# Patient Record
Sex: Male | Born: 1960 | Race: White | Hispanic: No | Marital: Married | Smoking: Former smoker
Health system: Southern US, Community
[De-identification: ages and names within clinical notes are randomized; demographics above are authoritative.]

## PROBLEM LIST (undated history)

## (undated) DIAGNOSIS — N529 Male erectile dysfunction, unspecified: Secondary | ICD-10-CM

## (undated) DIAGNOSIS — K219 Gastro-esophageal reflux disease without esophagitis: Secondary | ICD-10-CM

## (undated) DIAGNOSIS — M7041 Prepatellar bursitis, right knee: Secondary | ICD-10-CM

## (undated) HISTORY — DX: Prepatellar bursitis, right knee: M70.41

## (undated) HISTORY — DX: Gastro-esophageal reflux disease without esophagitis: K21.9

## (undated) HISTORY — PX: APPENDECTOMY: SHX54

## (undated) HISTORY — DX: Male erectile dysfunction, unspecified: N52.9

---

## 2010-11-10 HISTORY — PX: OTHER SURGICAL HISTORY: SHX169

## 2012-05-10 ENCOUNTER — Ambulatory Visit (INDEPENDENT_AMBULATORY_CARE_PROVIDER_SITE_OTHER): Admitting: Physician Assistant

## 2012-05-10 ENCOUNTER — Ambulatory Visit: Payer: Self-pay | Admitting: Physician Assistant

## 2012-05-10 ENCOUNTER — Encounter: Payer: Self-pay | Admitting: Physician Assistant

## 2012-05-10 VITALS — BP 118/80 | HR 83 | Ht 72.0 in | Wt 225.0 lb

## 2012-05-10 DIAGNOSIS — R5383 Other fatigue: Secondary | ICD-10-CM

## 2012-05-10 DIAGNOSIS — E291 Testicular hypofunction: Secondary | ICD-10-CM

## 2012-05-10 DIAGNOSIS — K219 Gastro-esophageal reflux disease without esophagitis: Secondary | ICD-10-CM

## 2012-05-10 MED ORDER — TADALAFIL 2.5 MG PO TABS: ORAL_TABLET | ORAL | Status: DC

## 2012-05-10 MED ORDER — TESTOSTERONE CYPIONATE 200 MG/ML IM SOLN
300.0000 mg | INTRAMUSCULAR | Status: DC
  Administered 2012-05-10 – 2012-07-05 (×3): 300 mg via INTRAMUSCULAR

## 2012-05-10 NOTE — Patient Instructions (Signed)
Will labs today and call with results.  Start taking Cialis daily. Will recheck in 1-2 months. [facility]  Come in for Testosterone shot in 2 week at next visit will check testosterone.

## 2012-05-11 LAB — VITAMIN B12: Vitamin B-12: 694 pg/mL (ref 211–911)

## 2012-05-11 LAB — TSH: TSH: 1.891 u[IU]/mL (ref 0.350–4.500)

## 2012-05-13 ENCOUNTER — Encounter: Payer: Self-pay | Admitting: Physician Assistant

## 2012-05-13 DIAGNOSIS — K219 Gastro-esophageal reflux disease without esophagitis: Secondary | ICD-10-CM | POA: Insufficient documentation

## 2012-05-13 DIAGNOSIS — E291 Testicular hypofunction: Secondary | ICD-10-CM | POA: Insufficient documentation

## 2012-05-13 MED ORDER — IBUPROFEN-FAMOTIDINE 800-26.6 MG PO TABS: 1.0000 | ORAL_TABLET | Freq: Three times a day (TID) | ORAL | Status: DC

## 2012-05-13 MED ORDER — ESOMEPRAZOLE MAGNESIUM 40 MG PO CPDR: 40.0000 mg | DELAYED_RELEASE_CAPSULE | Freq: Two times a day (BID) | ORAL | Status: DC

## 2012-05-13 NOTE — Progress Notes (Signed)
  Subjective:    Patient ID: John Sharp, male    DOB: Dec 14, 1960, 51 y.o.   MRN: [id number]  HPI Patient presents to the clinic to establish care. PMH reviewed and positive for GERD and Erectile dysfunction. Pt needs refill of nexium and Duexis for acid reflux. He has had a full work up of GERD and findings were that he had slow digestion. Normal colonoscopy in 2011. He has had ED and tried Viagra, Levitra, and as needed Cialis. NOthing has worked it wants to know if there is anything else he can try. He is on testosterone shots and will get one today but is 1 week late for shot. Denies any urinary frequency, urgency, weak stream or prostate issues. He was seen by urology about a year ago and prostate was checked.    Review of Systems     Objective:   Physical Exam  Constitutional: He is oriented to person, place, and time. He appears well-developed and well-nourished.  HENT:  Head: Normocephalic and atraumatic.  Eyes: Conjunctivae are normal.  Neck: Normal range of motion. Neck supple. No thyromegaly present.  Cardiovascular: Normal rate, regular rhythm and normal heart sounds.   Pulmonary/Chest: Effort normal and breath sounds normal.  Neurological: He is alert and oriented to person, place, and time.  Skin: Skin is warm and dry.  Psychiatric: He has a normal mood and affect. His behavior is normal.          Assessment & Plan:  hypogonadisim/Fatigue- Gave shot today. Will recheck testosterone at next shot in 2 weeks. His testosterone might be low and affecting how he fills. We will also check b12, vit D, TSH to make sure there are in normal limits.   Erectile dysfunction- Will give Cialis every day to see if that helps his ED. Follow up in 4-6 weeks.   GERD- Refilled both meds for 6 months.   Needs CPE. Could get fasting labs at that time.

## 2012-05-20 ENCOUNTER — Telehealth: Payer: Self-pay | Admitting: *Deleted

## 2012-05-20 NOTE — Telephone Encounter (Signed)
I put in the prescription but wasn't sure what pharmacy. We will try the Viagra. I did write 100 mg which the higher dose that he can try taking a half and see if this works well.

## 2012-05-20 NOTE — Telephone Encounter (Signed)
Pt came in office asking about filling the Cialis. I called pharmacy and they state that it keeps coming back that he needs to try Viagra first. Please advise if I can send Viagra and what dose?

## 2012-05-21 NOTE — Telephone Encounter (Signed)
See if has coupon card for free tabs of cialis.  At least he can use that while we are waiting. He can have pharm re-run the cialis and that should trigger a prior auth.

## 2012-05-21 NOTE — Telephone Encounter (Signed)
Called pt to inform him and he states he has tried the Viagra and it is in his records in [place]. Told pt need those records first- he will come in on Monday to sign release so we can get those records and we will proceed from there.

## 2012-05-21 NOTE — Telephone Encounter (Signed)
Pt wife notified and will pick up coupon today. KG LPN

## 2012-05-24 ENCOUNTER — Ambulatory Visit (INDEPENDENT_AMBULATORY_CARE_PROVIDER_SITE_OTHER): Admitting: Physician Assistant

## 2012-05-24 DIAGNOSIS — E291 Testicular hypofunction: Secondary | ICD-10-CM

## 2012-05-24 DIAGNOSIS — N529 Male erectile dysfunction, unspecified: Secondary | ICD-10-CM | POA: Insufficient documentation

## 2012-05-24 NOTE — Progress Notes (Signed)
  Subjective:    Patient ID: John Sharp, male    DOB: 31-Dec-1960, 52 y.o.   MRN: [id number] Testosterone injection. Pt brought own med in. Testosterone 200mg /ml Lot# [id number] Exp. 2/16.  Injected 1.31ml into right ventrogluteal without complications. KG LPN HPI    Review of Systems     Objective:   Physical Exam        Assessment & Plan:

## 2012-05-25 LAB — TESTOSTERONE: Testosterone: 330.66 ng/dL (ref 300–890)

## 2012-06-07 ENCOUNTER — Encounter: Payer: Self-pay | Admitting: Physician Assistant

## 2012-06-07 ENCOUNTER — Ambulatory Visit (INDEPENDENT_AMBULATORY_CARE_PROVIDER_SITE_OTHER): Admitting: Physician Assistant

## 2012-06-07 VITALS — BP 138/93 | HR 70

## 2012-06-07 DIAGNOSIS — E291 Testicular hypofunction: Secondary | ICD-10-CM

## 2012-06-07 DIAGNOSIS — G4733 Obstructive sleep apnea (adult) (pediatric): Secondary | ICD-10-CM | POA: Insufficient documentation

## 2012-06-07 NOTE — Progress Notes (Signed)
  Subjective:    Patient ID: John Sharp, male    DOB: 12-21-1960, 51 y.o.   MRN: [id number] Testosterone injection. Pt brought his own injection medication. Lot# [id number] Exp. 12/2014. Injection given on Left hip. HPI     Review of Systems     Objective:   Physical Exam        Assessment & Plan:  Hypogonadism- Injection given today. Next injection in 2 weeks. Will continue to monitor blood pressure. BP not been a problem in the past. Tandy Gaw PA-C.

## 2012-06-21 ENCOUNTER — Ambulatory Visit (INDEPENDENT_AMBULATORY_CARE_PROVIDER_SITE_OTHER): Admitting: Physician Assistant

## 2012-06-21 ENCOUNTER — Encounter: Payer: Self-pay | Admitting: Physician Assistant

## 2012-06-21 VITALS — BP 141/87 | HR 80 | Ht 72.0 in | Wt 220.0 lb

## 2012-06-21 DIAGNOSIS — R0981 Nasal congestion: Secondary | ICD-10-CM

## 2012-06-21 DIAGNOSIS — N529 Male erectile dysfunction, unspecified: Secondary | ICD-10-CM

## 2012-06-21 DIAGNOSIS — M25579 Pain in unspecified ankle and joints of unspecified foot: Secondary | ICD-10-CM

## 2012-06-21 DIAGNOSIS — Z13220 Encounter for screening for lipoid disorders: Secondary | ICD-10-CM

## 2012-06-21 DIAGNOSIS — K219 Gastro-esophageal reflux disease without esophagitis: Secondary | ICD-10-CM

## 2012-06-21 DIAGNOSIS — J3489 Other specified disorders of nose and nasal sinuses: Secondary | ICD-10-CM

## 2012-06-21 DIAGNOSIS — E291 Testicular hypofunction: Secondary | ICD-10-CM

## 2012-06-21 DIAGNOSIS — Z131 Encounter for screening for diabetes mellitus: Secondary | ICD-10-CM

## 2012-06-21 DIAGNOSIS — M25572 Pain in left ankle and joints of left foot: Secondary | ICD-10-CM

## 2012-06-21 MED ORDER — TRAMADOL HCL 50 MG PO TABS: 50.0000 mg | ORAL_TABLET | Freq: Three times a day (TID) | ORAL | Status: DC | PRN

## 2012-06-21 MED ORDER — FLUTICASONE PROPIONATE 50 MCG/ACT NA SUSP: 2.0000 | Freq: Every day | NASAL | Status: DC

## 2012-06-21 MED ORDER — TRAMADOL HCL 50 MG PO TABS: 50.0000 mg | ORAL_TABLET | Freq: Three times a day (TID) | ORAL | Status: AC | PRN

## 2012-06-21 NOTE — Progress Notes (Signed)
Subjective:    Patient ID: John Sharp, male    DOB: 1961/09/24, 51 y.o.   MRN: [id number]  HPI Pt presents to clinic with nasal congestion and starting to feel under the weather for last 2-3 days. She has recently started using CPAP and not being able to wear it all night long because making is nose so dried up. He denies any fever, chills, SOB, muscle aches, ST, Ear pain, Sinus pressure or headaches. He has not tried anything to make nasal congestion better.   He has also had worsening GERD. He drinks multiple cups of coffee a day, drinks alcohol regularly and is on a daily NSAId for bilateral ankle pain from injury in 2012 where he broke both of his ankles. He is having to take Nexium more than once a day. He also reports that he tries to manage is diet better but still eats pretty much what he wants too.   Left ankle pain has started to get more frequent. He take Duexis daily for pain and manages pretty well but lately he will have episodes that he hurts a lot more. This is an ongoing problem since he broke both ankles in 2012. Left ankle hurts more than right. He has not tried anything other than Duexis to make better. He is on his feet a lot.   Concerned about ED. Cialis daily did help but insurance will not approve. Needs prior auth. He denies any depression or anxiety. He does drink alcohol on a regular basis. He does not exercise. He is getting testosterone shots every 2 weeks and has not been in therapuetic range yet. He denies any urinary problems or symptoms with weak stream or urgency. He denies any Prostate problems or family hx of prostate problems. He has had long hx with ED and tried and failed Viagra, Levitra, and cialis as needed.      Review of Systems     Objective:   Physical Exam  Constitutional: He is oriented to person, place, and time. He appears well-developed and well-nourished.  HENT:  Head: Normocephalic and atraumatic.  Right Ear: External ear normal.  Left Ear:  External ear normal.  Mouth/Throat: Oropharynx is clear and moist. No oropharyngeal exudate.       TM's normal bilaterally. Negative for maxillary sinus pressure. Bilateral turbinates red and swollen.   Neck: Normal range of motion. Neck supple.  Cardiovascular: Normal rate, regular rhythm, normal heart sounds and intact distal pulses.   Pulmonary/Chest: Effort normal and breath sounds normal.  Musculoskeletal:       Left ankle: Normal ROM without pain. Tenderness over left lateral malleolus( can actually palpate hardware). Strength 5/5. No brusing, swelling.  Lymphadenopathy:    He has no cervical adenopathy.  Neurological: He is alert and oriented to person, place, and time.  Skin: Skin is warm and dry.  Psychiatric: He has a normal mood and affect. His behavior is normal.          Assessment & Plan:  Nasal congestion- Will rx Flonase to use 2 sprays each nostril once a day. Also told him to get nasal saline to use before he puts his nasal cannula for CPAP in at night to help nose not be so dry. Talked about even changing nasal cannula to mouth mask. It is important that he use CPAP all night to get benefit. Encouraged patient to take Mucinex if starting to have sinus pressure also encourage to suck on Zinc tabs and increase Vitamin C.  Hypogonadism- Was given injection today. Will recheck in 2 weeks blood testosterone before next injection. Goal 500-700 or when patient gets symptomatic benefit.   Erectile Dysfuntion- will work on prior authorization for Cialis daily. Discussed with patient next step is urology. He denies any prostate issues or problems with urination. PSA was done recently and normal. Perhaps his daily ibuprofen might be affect ED but I think benefit outweights side effect and was having ED problems before injury. The only other thing that could help is weight loss and decreasing alcohol consumption. Patient was encouraged to exercise regularly. Since he has a lot of  ankle pain was encouraged to exerise in water. I will order Lipid panel and CMP to screen for cholesterol and diabetes. Needs CPE. Pt told to fast before labs.   GERD- discussed with patient diet. He is drinking a lot of coffee and alcohol both of which are not good for GERD. He also is taking anti-inflammatories every day. Before switching to another medication I think he should cut things that make GERD worse in half and see if any benefit occurs. In January EGD was normal. If not improving can consider switching Nexium to Dexilant.   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 Told not to take extra tylenol OtC when taking Tramadol. Encourage patient to do regular ROM exercises at home. Encourage to wear ace bandage or support when going to be on feet all day. Continue on Duexis daily. Ice and elevate when needed. If starting to become everyday pain could consider consult with Dr. Karie Schwalbe.

## 2012-06-21 NOTE — Patient Instructions (Addendum)
  Sent Tramadol for break through pain. Need to try to control GERD with diet.  Get lipid panel at [facility] when fasting.  2 week get testosterone rechecked. Use nasal saline before use of CPAP. Diet for GERD or PUD Nutrition therapy can help ease the discomfort of gastroesophageal reflux disease (GERD) and peptic ulcer disease (PUD).  HOME CARE INSTRUCTIONS   Eat your meals slowly, in a relaxed setting.   Eat 5 to 6 small meals per day.   If a food causes distress, stop eating it for a period of time.  FOODS TO AVOID  Coffee, regular or decaffeinated.   Cola beverages, regular or low calorie.   Tea, regular or decaffeinated.   Pepper.   Cocoa.   High fat foods, including meats.   Butter, margarine, hydrogenated oil (trans fats).   Peppermint or spearmint (if you have GERD).   Fruits and vegetables if not tolerated.   Alcohol.   Nicotine (smoking or chewing). This is one of the most potent stimulants to acid production in the gastrointestinal tract.   Any food that seems to aggravate your condition.  If you have questions regarding your diet, ask your caregiver or a registered dietitian. TIPS  Lying flat may make symptoms worse. Keep the head of your bed raised 6 to 9 inches (15 to 23 cm) by using a foam wedge or blocks under the legs of the bed.   Do not lay down until 3 hours after eating a meal.   Daily physical activity may help reduce symptoms.  MAKE SURE YOU:   Understand these instructions.   Will watch your condition.   Will get help right away if you are not doing well or get worse.  Document Released: 10/27/2005 Document Revised: 10/16/2011 Document Reviewed: 09/12/2011 [facility] Patient Information 2012 [place], [place].

## 2012-06-24 ENCOUNTER — Other Ambulatory Visit: Payer: Self-pay | Admitting: *Deleted

## 2012-06-24 LAB — COMPLETE METABOLIC PANEL WITH GFR
ALT: 28 U/L (ref 0–53)
AST: 29 U/L (ref 0–37)
Albumin: 4 g/dL (ref 3.5–5.2)
BUN: 18 mg/dL (ref 6–23)
CO2: 26 mEq/L (ref 19–32)
Calcium: 9 mg/dL (ref 8.4–10.5)
Chloride: 107 mEq/L (ref 96–112)
Creat: 0.95 mg/dL (ref 0.50–1.35)
GFR, Est African American: >89 mL/min
Potassium: 4.8 mEq/L (ref 3.5–5.3)

## 2012-06-24 LAB — LIPID PANEL
Cholesterol: 164 mg/dL (ref 0–200)
Triglycerides: 57 mg/dL (ref <150)
VLDL: 11 mg/dL (ref 0–40)

## 2012-06-24 MED ORDER — TADALAFIL 2.5 MG PO TABS: ORAL_TABLET | ORAL | Status: DC

## 2012-07-05 ENCOUNTER — Ambulatory Visit (INDEPENDENT_AMBULATORY_CARE_PROVIDER_SITE_OTHER): Admitting: Physician Assistant

## 2012-07-05 DIAGNOSIS — E291 Testicular hypofunction: Secondary | ICD-10-CM

## 2012-07-05 NOTE — Progress Notes (Signed)
Patient ID: John Sharp, male   DOB: 06/24/1961, 51 y.o.   MRN: [id number] Testosterone inj given 

## 2012-07-13 ENCOUNTER — Other Ambulatory Visit: Payer: Self-pay | Admitting: *Deleted

## 2012-07-13 NOTE — Telephone Encounter (Signed)
Wife has called stating that pt needs a refill on testosterone. States that he needs to get it through [facility] b/c it is cheaper. Please advise if this can be sent.

## 2012-07-13 NOTE — Telephone Encounter (Signed)
If we give injections here, then we can use our supply. He doesn't have to order it.

## 2012-07-13 NOTE — Telephone Encounter (Signed)
Pts wife informed.

## 2012-07-15 ENCOUNTER — Other Ambulatory Visit: Payer: Self-pay | Admitting: *Deleted

## 2012-07-15 MED ORDER — TESTOSTERONE CYPIONATE 200 MG/ML IM SOLN: 300.0000 mg | INTRAMUSCULAR | Status: DC

## 2012-07-19 ENCOUNTER — Other Ambulatory Visit: Payer: Self-pay | Admitting: *Deleted

## 2012-07-19 ENCOUNTER — Ambulatory Visit (INDEPENDENT_AMBULATORY_CARE_PROVIDER_SITE_OTHER): Admitting: Physician Assistant

## 2012-07-19 VITALS — BP 138/90 | HR 80

## 2012-07-19 DIAGNOSIS — E291 Testicular hypofunction: Secondary | ICD-10-CM

## 2012-07-19 MED ORDER — TESTOSTERONE CYPIONATE 200 MG/ML IM SOLN
200.0000 mg | Freq: Once | INTRAMUSCULAR | Status: AC
  Administered 2012-07-19: 300 mg via INTRAMUSCULAR

## 2012-07-19 MED ORDER — TADALAFIL 2.5 MG PO TABS: ORAL_TABLET | ORAL | Status: DC

## 2012-07-19 NOTE — Progress Notes (Signed)
  Subjective:    Patient ID: John Sharp, male    DOB: 24-Jul-1961, 51 y.o.   MRN: [id number]  HPI  Testosterone injection given.  Review of Systems     Objective:   Physical Exam        Assessment & Plan:  Injection given with no complications. 2 week f/u for another shot. Tandy Gaw PA-C

## 2012-08-02 ENCOUNTER — Ambulatory Visit: Admitting: Family Medicine

## 2012-08-02 ENCOUNTER — Telehealth: Payer: Self-pay | Admitting: *Deleted

## 2012-08-02 ENCOUNTER — Ambulatory Visit (INDEPENDENT_AMBULATORY_CARE_PROVIDER_SITE_OTHER): Admitting: Physician Assistant

## 2012-08-02 VITALS — Wt 225.0 lb

## 2012-08-02 DIAGNOSIS — R7989 Other specified abnormal findings of blood chemistry: Secondary | ICD-10-CM

## 2012-08-02 DIAGNOSIS — E291 Testicular hypofunction: Secondary | ICD-10-CM

## 2012-08-02 MED ORDER — TESTOSTERONE CYPIONATE 200 MG/ML IM SOLN: 300.0000 mg | Freq: Once | INTRAMUSCULAR | Status: DC

## 2012-08-02 NOTE — Progress Notes (Signed)
  Subjective:    Patient ID: John Sharp, male    DOB: December 11, 1960, 51 y.o.   MRN: [id number]  HPI Testosterone injection in rt glut   Review of Systems     Objective:   Physical Exam        Assessment & Plan:  INjection given. Next injection in 2 weeks. Tandy Gaw PA-c

## 2012-08-09 ENCOUNTER — Ambulatory Visit: Admitting: Physician Assistant

## 2012-08-10 LAB — TESTOSTERONE, FREE, TOTAL, SHBG
Sex Hormone Binding: 16 nmol/L (ref 13–71)
Testosterone, Free: 279 pg/mL — ABNORMAL HIGH (ref 47.0–244.0)
Testosterone-% Free: 3.2 % — ABNORMAL HIGH (ref 1.6–2.9)

## 2012-08-13 ENCOUNTER — Other Ambulatory Visit: Payer: Self-pay | Admitting: Physician Assistant

## 2012-08-13 MED ORDER — TESTOSTERONE CYPIONATE 200 MG/ML IM SOLN: INTRAMUSCULAR | Status: DC

## 2012-08-16 ENCOUNTER — Ambulatory Visit: Admitting: Physician Assistant

## 2012-08-19 ENCOUNTER — Ambulatory Visit (INDEPENDENT_AMBULATORY_CARE_PROVIDER_SITE_OTHER): Admitting: Sports Medicine

## 2012-08-19 VITALS — BP 142/87 | HR 75

## 2012-08-19 DIAGNOSIS — E291 Testicular hypofunction: Secondary | ICD-10-CM

## 2012-08-19 MED ORDER — TESTOSTERONE CYPIONATE 200 MG/ML IM SOLN
250.0000 mg | Freq: Once | INTRAMUSCULAR | Status: AC
  Administered 2012-08-19: 250 mg via INTRAMUSCULAR

## 2012-08-19 NOTE — Progress Notes (Signed)
  Subjective:    Patient ID: John Sharp, male    DOB: 05-04-1961, 51 y.o.   MRN: [id number] Testosterone injection  HPI    Review of Systems     Objective:   Physical Exam        Assessment & Plan:   John Sharp, M.D. was present for all essential parts of this procedure.

## 2012-09-02 ENCOUNTER — Ambulatory Visit (INDEPENDENT_AMBULATORY_CARE_PROVIDER_SITE_OTHER): Admitting: Sports Medicine

## 2012-09-02 VITALS — BP 156/95 | HR 95

## 2012-09-02 DIAGNOSIS — E291 Testicular hypofunction: Secondary | ICD-10-CM

## 2012-09-02 MED ORDER — TESTOSTERONE CYPIONATE 200 MG/ML IM SOLN
250.0000 mg | Freq: Once | INTRAMUSCULAR | Status: AC
  Administered 2012-09-02: 250 mg via INTRAMUSCULAR

## 2012-09-02 NOTE — Progress Notes (Signed)
I was present, and supervised all essential parts of this procedure. Ihor Austin. Benjamin Stain, M.D.

## 2012-09-16 ENCOUNTER — Ambulatory Visit (INDEPENDENT_AMBULATORY_CARE_PROVIDER_SITE_OTHER): Admitting: Family Medicine

## 2012-09-16 VITALS — BP 152/90 | HR 60

## 2012-09-16 DIAGNOSIS — E291 Testicular hypofunction: Secondary | ICD-10-CM

## 2012-09-16 MED ORDER — TESTOSTERONE CYPIONATE 200 MG/ML IM SOLN
250.0000 mg | INTRAMUSCULAR | Status: DC
  Administered 2012-09-16: 250 mg via INTRAMUSCULAR

## 2012-09-16 NOTE — Progress Notes (Signed)
  Subjective:    Patient ID: John Sharp, male    DOB: 1961-07-29, 51 y.o.   MRN: [id number]  HPI   Here for testosterone injection Review of Systems     Objective:   Physical Exam        Assessment & Plan:

## 2012-09-29 ENCOUNTER — Other Ambulatory Visit: Payer: Self-pay | Admitting: Physician Assistant

## 2012-09-30 ENCOUNTER — Ambulatory Visit (INDEPENDENT_AMBULATORY_CARE_PROVIDER_SITE_OTHER): Admitting: Family Medicine

## 2012-09-30 ENCOUNTER — Telehealth: Payer: Self-pay | Admitting: Physician Assistant

## 2012-09-30 DIAGNOSIS — E291 Testicular hypofunction: Secondary | ICD-10-CM

## 2012-09-30 MED ORDER — TESTOSTERONE CYPIONATE 200 MG/ML IM SOLN
250.0000 mg | Freq: Once | INTRAMUSCULAR | Status: AC
  Administered 2012-09-30: 250 mg via INTRAMUSCULAR

## 2012-09-30 NOTE — Telephone Encounter (Signed)
Patient request to know if he can or needs to have lab work done on his next visit-vew

## 2012-09-30 NOTE — Progress Notes (Signed)
  Subjective:    Patient ID: John Sharp, male    DOB: [birth year], 51 y.o.   MRN: [id number] Testosterone injection HPI    Review of Systems     Objective:   Physical Exam        Assessment & Plan:

## 2012-10-04 NOTE — Telephone Encounter (Signed)
Wife aware

## 2012-10-04 NOTE — Telephone Encounter (Signed)
Will do blood work before next injection. Please call pt and let him know.

## 2012-10-06 ENCOUNTER — Encounter: Payer: Self-pay | Admitting: Physician Assistant

## 2012-10-06 ENCOUNTER — Ambulatory Visit (INDEPENDENT_AMBULATORY_CARE_PROVIDER_SITE_OTHER): Admitting: Physician Assistant

## 2012-10-06 VITALS — BP 136/81 | HR 98 | Temp 98.1°F | Ht 72.0 in | Wt 234.0 lb

## 2012-10-06 DIAGNOSIS — J40 Bronchitis, not specified as acute or chronic: Secondary | ICD-10-CM

## 2012-10-06 DIAGNOSIS — J069 Acute upper respiratory infection, unspecified: Secondary | ICD-10-CM

## 2012-10-06 MED ORDER — BENZONATATE 100 MG PO CAPS: 100.0000 mg | ORAL_CAPSULE | Freq: Three times a day (TID) | ORAL | Status: DC | PRN

## 2012-10-06 MED ORDER — AMOXICILLIN-POT CLAVULANATE 875-125 MG PO TABS: 1.0000 | ORAL_TABLET | Freq: Two times a day (BID) | ORAL | Status: DC

## 2012-10-06 MED ORDER — HYDROCODONE-HOMATROPINE 5-1.5 MG/5ML PO SYRP: 5.0000 mL | ORAL_SOLUTION | Freq: Four times a day (QID) | ORAL | Status: DC | PRN

## 2012-10-06 NOTE — Patient Instructions (Addendum)
[facility] Cold Care liquid drops 2 drops Three times a day. Mucinex D twice a day.   If not improving will get Augmentin.   If spike fever go to Urgent Care to get chest x-ray.   Bronchitis Bronchitis is the body's way of reacting to injury and/or infection (inflammation) of the bronchi. Bronchi are the air tubes that extend from the windpipe into the lungs. If the inflammation becomes severe, it may cause shortness of breath. CAUSES  Inflammation may be caused by:  A virus.  Germs (bacteria).  Dust.  Allergens.  Pollutants and many other irritants. The cells lining the bronchial tree are covered with tiny hairs (cilia). These constantly beat upward, away from the lungs, toward the mouth. This keeps the lungs free of pollutants. When these cells become too irritated and are unable to do their job, mucus begins to develop. This causes the characteristic cough of bronchitis. The cough clears the lungs when the cilia are unable to do their job. Without either of these protective mechanisms, the mucus would settle in the lungs. Then you would develop pneumonia. Smoking is a common cause of bronchitis and can contribute to pneumonia. Stopping this habit is the single most important thing you can do to help yourself. TREATMENT   Your caregiver may prescribe an antibiotic if the cough is caused by bacteria. Also, medicines that open up your airways make it easier to breathe. Your caregiver may also recommend or prescribe an expectorant. It will loosen the mucus to be coughed up. Only take over-the-counter or prescription medicines for pain, discomfort, or fever as directed by your caregiver.  Removing whatever causes the problem (smoking, for example) is critical to preventing the problem from getting worse.  Cough suppressants may be prescribed for relief of cough symptoms.  Inhaled medicines may be prescribed to help with symptoms now and to help prevent problems from returning.  For those  with recurrent (chronic) bronchitis, there may be a need for steroid medicines. SEEK IMMEDIATE MEDICAL CARE IF:   During treatment, you develop more pus-like mucus (purulent sputum).  You have a fever.  Your baby is older than 3 months with a rectal temperature of 102 F (38.9 C) or higher.  Your baby is 62 months old or younger with a rectal temperature of 100.4 F (38 C) or higher.  You become progressively more ill.  You have increased difficulty breathing, wheezing, or shortness of breath. It is necessary to seek immediate medical care if you are elderly or sick from any other disease. MAKE SURE YOU:   Understand these instructions.  Will watch your condition.  Will get help right away if you are not doing well or get worse. Document Released: 10/27/2005 Document Revised: 01/19/2012 Document Reviewed: 09/05/2008 [facility] Patient Information 2013 [place], [place].

## 2012-10-06 NOTE — Progress Notes (Signed)
  Subjective:    Patient ID: John Sharp, male    DOB: 10/28/1961, 51 y.o.   MRN: [id number]  HPI Patient is a 51 yo male who presents to the clinic with persistent productive cough for last 7 days. The production is green and yellow. Denies any blood. He denies any Fever, ST, ear pain, SOB, wheezing. He has not had any sick contacts that he knows of. He has felt cold and then hot off and on. He does have some facial tenderness. He has tried Robtussin, regular mucinex, and tylenol sinus and cold. They have helped some. He is just so tired of coughing and keeping him up at night.    Review of Systems     Objective:   Physical Exam  Constitutional: He is oriented to person, place, and time. He appears well-developed and well-nourished.  HENT:  Head: Normocephalic and atraumatic.  Right Ear: External ear normal.  Left Ear: External ear normal.  Nose: Nose normal.  Mouth/Throat: No oropharyngeal exudate.       TM's clear. No blood or pus.   Mild maxillary tenderness to palpation.   Oropharynx erythematous with PND. Tonsils normal size.   Eyes: Conjunctivae normal are normal.  Neck: Normal range of motion. Neck supple.       Mild anterior cervical tenderness with enlargment.  Cardiovascular: Normal rate, regular rhythm and normal heart sounds.   Pulmonary/Chest: Effort normal and breath sounds normal. He has no wheezes.       Coarse breath sounds bilaterally.  Lymphadenopathy:    He has cervical adenopathy.  Neurological: He is alert and oriented to person, place, and time.  Skin: Skin is warm and dry.  Psychiatric: He has a normal mood and affect. His behavior is normal.          Assessment & Plan:  Bronchitis, Acute- Reassured patient that I suspect viral. I gave him cough syrup to use at night only to help him sleep. Tessalon pearles were given to take up to TID. Told him about [facility] cold care that could also help with cough. Discussed other symptomatic care. Since the holiday  did give rx for augmentin to get filled if worsened or continued into the weekend. If spikes fever go to UC and get CXR. Call office if any problems or concerns.

## 2012-10-14 ENCOUNTER — Ambulatory Visit (INDEPENDENT_AMBULATORY_CARE_PROVIDER_SITE_OTHER): Admitting: Physician Assistant

## 2012-10-14 VITALS — BP 140/88 | HR 90

## 2012-10-14 DIAGNOSIS — E291 Testicular hypofunction: Secondary | ICD-10-CM

## 2012-10-14 LAB — HEPATIC FUNCTION PANEL
ALT: 31 U/L (ref 0–53)
AST: 35 U/L (ref 0–37)
Bilirubin, Direct: 0.1 mg/dL (ref 0.0–0.3)
Indirect Bilirubin: 0.3 mg/dL (ref 0.0–0.9)
Total Protein: 6.6 g/dL (ref 6.0–8.3)

## 2012-10-14 MED ORDER — TESTOSTERONE CYPIONATE 200 MG/ML IM SOLN
250.0000 mg | INTRAMUSCULAR | Status: DC
  Administered 2012-10-14: 250 mg via INTRAMUSCULAR

## 2012-10-14 NOTE — Progress Notes (Signed)
Patient ID: John Sharp, male   DOB: 1961/08/29, 51 y.o.   MRN: [id number] Testosterone inj given

## 2012-10-14 NOTE — Progress Notes (Signed)
  Subjective:    Patient ID: John Sharp, male    DOB: [birth year], 51 y.o.   MRN: [id number]  HPI    Review of Systems     Objective:   Physical Exam        Assessment & Plan:  Testosterone injection given with no complications. Tandy Gaw PA-C Follow up in 2 weeks with next injection. Will call patient with labs.

## 2012-10-15 LAB — TESTOSTERONE, FREE, TOTAL, SHBG
Sex Hormone Binding: 16 nmol/L (ref 13–71)
Testosterone, Free: 62.5 pg/mL (ref 47.0–244.0)
Testosterone: 228.82 ng/dL — ABNORMAL LOW (ref 300–890)

## 2012-10-18 ENCOUNTER — Other Ambulatory Visit: Payer: Self-pay | Admitting: Physician Assistant

## 2012-10-18 MED ORDER — TESTOSTERONE CYPIONATE 200 MG/ML IM SOLN: INTRAMUSCULAR | Status: DC

## 2012-10-21 ENCOUNTER — Ambulatory Visit (INDEPENDENT_AMBULATORY_CARE_PROVIDER_SITE_OTHER): Admitting: Family Medicine

## 2012-10-21 ENCOUNTER — Encounter: Payer: Self-pay | Admitting: Family Medicine

## 2012-10-21 VITALS — BP 134/92 | HR 90 | Temp 97.7°F | Wt 235.0 lb

## 2012-10-21 DIAGNOSIS — H6990 Unspecified Eustachian tube disorder, unspecified ear: Secondary | ICD-10-CM

## 2012-10-21 DIAGNOSIS — H9201 Otalgia, right ear: Secondary | ICD-10-CM

## 2012-10-21 DIAGNOSIS — H6980 Other specified disorders of Eustachian tube, unspecified ear: Secondary | ICD-10-CM

## 2012-10-21 DIAGNOSIS — H9209 Otalgia, unspecified ear: Secondary | ICD-10-CM

## 2012-10-21 MED ORDER — PREDNISONE 20 MG PO TABS: ORAL_TABLET | ORAL | Status: AC

## 2012-10-21 NOTE — Progress Notes (Signed)
CC: John Sharp is a 51 y.o. male is here for Otalgia   Subjective: HPI:  Right ear pressure started one week ago present 24 hours a day, does not wake at night, nothing seems to make it better or worse. Pain is moderate in severity. He's never had this before. Symptoms seem to start after he stopped Augmentin for upper respiratory infection 2 weeks ago. Associated with nasal congestion which is clear and a dry cough. Denies fevers, chills, hearing loss, dizziness, ringing of the ears, ear discharge, sinus pain, headaches, nor shortness of breath. Interventions include Mucinex without much help   Review Of Systems Outlined In HPI  Past Medical History  Diagnosis Date  . GERD (gastroesophageal reflux disease)   . Erectile dysfunction      No family history on file.   History  Substance Use Topics  . Smoking status: Never Smoker   . Smokeless tobacco: Not on file  . Alcohol Use: Not on file     Objective: Filed Vitals:   10/21/12 0917  BP: 134/92  Pulse: 90  Temp: 97.7 F (36.5 C)    General: Alert and Oriented, No Acute Distress HEENT: Pupils equal, round, reactive to light. Conjunctivae clear.  External ears unremarkable, canals clear with intact TMs with appropriate landmarks.  Middle ear appears open without effusion. Pink inferior turbinates.  Moist mucous membranes, pharynx without inflammation nor lesions.  Neck supple without palpable lymphadenopathy nor abnormal masses. Lungs: Clear to auscultation bilaterally, no wheezing/ronchi/rales.  Comfortable work of breathing. Good air movement. Cardiac: Regular rate and rhythm. Normal S1/S2.  No murmurs, rubs, nor gallops.    Tympanogram performed showing normal external cavity volume however significant negative middle ear pressure.  Assessment & Plan: Demarkus was seen today for otalgia.  Diagnoses and associated orders for this visit:  Eustachian tube dysfunction - predniSONE (DELTASONE) 20 MG tablet; Three tabs at once  daily for five days.  Right ear pain - predniSONE (DELTASONE) 20 MG tablet; Three tabs at once daily for five days.    Clear middle ear and tympanogram would suggest eustachian tube dysfunction, he will restart daily Flonase throughout the winter season and given his degree of discomfort we'll start  prednisone burst.  Return if symptoms worsen or fail to improve.

## 2012-10-26 ENCOUNTER — Telehealth: Payer: Self-pay | Admitting: *Deleted

## 2012-10-26 DIAGNOSIS — H9209 Otalgia, unspecified ear: Secondary | ICD-10-CM

## 2012-10-26 MED ORDER — HYDROCODONE-HOMATROPINE 5-1.5 MG/5ML PO SYRP: 5.0000 mL | ORAL_SOLUTION | Freq: Three times a day (TID) | ORAL | Status: DC | PRN

## 2012-10-26 NOTE — Telephone Encounter (Signed)
Sue Lush, Can you please check to see if he started the 5 days of prednisone and restarted daily use of Flonase.  If so and still having ear issues I'll need to refer him to an ENT specialist.  I don't recall talking about cough medicine but I'd be happy to call in a prescription strength cough medicine if he can confirm that he has no allergies.

## 2012-10-26 NOTE — Telephone Encounter (Signed)
Spoke with pt's wife and he has finished the prednisone and is using the flonase, but the wife states he is still having issues. Pt does have a cough and would like the cough medication. Advised that Dr. Ivan Anchors would refer him to ENT

## 2012-10-26 NOTE — Telephone Encounter (Signed)
rx sent

## 2012-10-26 NOTE — Telephone Encounter (Signed)
ENT referral placed,  Sue Lush, will you please call in the [facility] rx that i've placed in your mailbox.  Thank you.

## 2012-10-26 NOTE — Telephone Encounter (Signed)
Pt calls and states that his ear feels blocked and can not hear and wants to know if there is anything else you can give him. Also has cough and thought you were gonna send in cough med as well but didn't. Using Robitussin OTC no help. Uses [facility] [place]

## 2012-10-28 ENCOUNTER — Ambulatory Visit (INDEPENDENT_AMBULATORY_CARE_PROVIDER_SITE_OTHER): Admitting: Family Medicine

## 2012-10-28 DIAGNOSIS — E291 Testicular hypofunction: Secondary | ICD-10-CM

## 2012-10-28 MED ORDER — TESTOSTERONE CYPIONATE 200 MG/ML IM SOLN
300.0000 mg | Freq: Once | INTRAMUSCULAR | Status: AC
  Administered 2012-10-28: 300 mg via INTRAMUSCULAR

## 2012-10-28 NOTE — Progress Notes (Signed)
  Subjective:    Patient ID: John Sharp, male    DOB: 01/06/1961, 51 y.o.   MRN: [id number]  HPI    Review of Systems     Objective:   Physical Exam        Assessment & Plan:  Test inj given.

## 2012-11-11 ENCOUNTER — Ambulatory Visit

## 2012-11-12 ENCOUNTER — Ambulatory Visit (INDEPENDENT_AMBULATORY_CARE_PROVIDER_SITE_OTHER): Admitting: Physician Assistant

## 2012-11-12 VITALS — BP 122/81 | HR 68

## 2012-11-12 DIAGNOSIS — E291 Testicular hypofunction: Secondary | ICD-10-CM

## 2012-11-12 MED ORDER — TESTOSTERONE CYPIONATE 200 MG/ML IM SOLN
300.0000 mg | Freq: Once | INTRAMUSCULAR | Status: AC
  Administered 2012-11-12: 300 mg via INTRAMUSCULAR

## 2012-11-12 NOTE — Progress Notes (Signed)
  Subjective:    Patient ID: John Sharp, male    DOB: 10-Jan-1961, 52 y.o.   MRN: [id number]  HPI   Here for a testosterone injection Review of Systems     Objective:   Physical Exam        Assessment & Plan:  Injection given without complication. Follow up in 2 weeks for another injection. Tandy Gaw PA-C

## 2012-11-26 ENCOUNTER — Ambulatory Visit (INDEPENDENT_AMBULATORY_CARE_PROVIDER_SITE_OTHER): Admitting: Physician Assistant

## 2012-11-26 VITALS — BP 135/90 | HR 79

## 2012-11-26 DIAGNOSIS — E291 Testicular hypofunction: Secondary | ICD-10-CM

## 2012-11-26 MED ORDER — TESTOSTERONE CYPIONATE 200 MG/ML IM SOLN
300.0000 mg | Freq: Once | INTRAMUSCULAR | Status: AC
  Administered 2012-11-26: 300 mg via INTRAMUSCULAR

## 2012-11-26 NOTE — Progress Notes (Signed)
  Subjective:    Patient ID: John Sharp, male    DOB: 06-12-1961, 52 y.o.   MRN: [id number]  HPI Testosterone injection given.   Review of Systems     Objective:   Physical Exam        Assessment & Plan:  Testosterone injection given without complaint. Followup in 2 weeks for next injection. Tandy Gaw PA-c

## 2012-12-09 ENCOUNTER — Ambulatory Visit (INDEPENDENT_AMBULATORY_CARE_PROVIDER_SITE_OTHER): Admitting: Family Medicine

## 2012-12-09 DIAGNOSIS — E291 Testicular hypofunction: Secondary | ICD-10-CM

## 2012-12-09 MED ORDER — TESTOSTERONE CYPIONATE 200 MG/ML IM SOLN
300.0000 mg | Freq: Once | INTRAMUSCULAR | Status: AC
  Administered 2012-12-09: 300 mg via INTRAMUSCULAR

## 2012-12-09 NOTE — Progress Notes (Signed)
  Subjective:    Patient ID: John Sharp, male    DOB: Mar 20, 1961, 52 y.o.   MRN: [id number] Testosterone injection.  300mg .  Pt brings own testosterone. HPI    Review of Systems     Objective:   Physical Exam        Assessment & Plan:

## 2012-12-10 ENCOUNTER — Ambulatory Visit

## 2012-12-24 ENCOUNTER — Ambulatory Visit (INDEPENDENT_AMBULATORY_CARE_PROVIDER_SITE_OTHER): Admitting: Physician Assistant

## 2012-12-24 ENCOUNTER — Telehealth: Payer: Self-pay | Admitting: Physician Assistant

## 2012-12-24 VITALS — BP 138/94

## 2012-12-24 DIAGNOSIS — E291 Testicular hypofunction: Secondary | ICD-10-CM

## 2012-12-24 MED ORDER — TESTOSTERONE CYPIONATE 200 MG/ML IM SOLN: 300.0000 mg | Freq: Once | INTRAMUSCULAR | Status: DC

## 2012-12-24 NOTE — Progress Notes (Signed)
  Subjective:    Patient ID: John Sharp, male    DOB: [birth year], 52 y.o.   MRN: [id number]  HPI   Here for testosterone injection  Review of Systems     Objective:   Physical Exam        Assessment & Plan:

## 2012-12-24 NOTE — Progress Notes (Signed)
  Subjective:    Patient ID: John Sharp, male    DOB: Feb 21, 1961, 52 y.o.   MRN: [id number]  HPI Pt comes in for testosterone shot.    Review of Systems     Objective:   Physical Exam        Assessment & Plan:  Male hypogonadism- Gave testosterone shot with no complications. Ordered testosterone and CBC to test in 1 week. Next shot in 2 weeks. Derriana Oser PA-C.

## 2012-12-24 NOTE — Telephone Encounter (Signed)
Pt notified.  Will call next Friday for me to release labs.

## 2012-12-24 NOTE — Telephone Encounter (Signed)
Will you please call pt and let him know it has been 6 weeks at this dose lets recheck testosterone 1 week from today at peak level to see if we can get within optimal range for you. Will order labs will have to release them when he comes in a week.

## 2012-12-26 ENCOUNTER — Other Ambulatory Visit: Payer: Self-pay | Admitting: Physician Assistant

## 2012-12-31 ENCOUNTER — Other Ambulatory Visit: Payer: Self-pay | Admitting: *Deleted

## 2012-12-31 DIAGNOSIS — E291 Testicular hypofunction: Secondary | ICD-10-CM

## 2012-12-31 LAB — CBC
HCT: 43.5 % (ref 39.0–52.0)
Hemoglobin: 14.9 g/dL (ref 13.0–17.0)
MCV: 86.1 fL (ref 78.0–100.0)
RBC: 5.05 MIL/uL (ref 4.22–5.81)
WBC: 5.1 10*3/uL (ref 4.0–10.5)

## 2013-01-03 LAB — TESTOSTERONE, FREE, TOTAL, SHBG
Testosterone-% Free: 3.1 % — ABNORMAL HIGH (ref 1.6–2.9)
Testosterone: 848 ng/dL (ref 300–890)

## 2013-01-07 ENCOUNTER — Ambulatory Visit (INDEPENDENT_AMBULATORY_CARE_PROVIDER_SITE_OTHER): Admitting: Family Medicine

## 2013-01-07 DIAGNOSIS — E291 Testicular hypofunction: Secondary | ICD-10-CM

## 2013-01-07 MED ORDER — TESTOSTERONE CYPIONATE 200 MG/ML IM SOLN
200.0000 mg | Freq: Once | INTRAMUSCULAR | Status: AC
  Administered 2013-01-07: 200 mg via INTRAMUSCULAR

## 2013-01-07 NOTE — Progress Notes (Signed)
  Subjective:    Patient ID: John Sharp, male    DOB: 1961-06-19, 52 y.o.   MRN: [id number]  HPI    Review of Systems     Objective:   Physical Exam        Assessment & Plan:  Test injection

## 2013-01-21 ENCOUNTER — Encounter: Payer: Self-pay | Admitting: Physician Assistant

## 2013-01-21 ENCOUNTER — Ambulatory Visit (INDEPENDENT_AMBULATORY_CARE_PROVIDER_SITE_OTHER): Admitting: Physician Assistant

## 2013-01-21 VITALS — BP 143/89 | HR 88 | Wt 238.0 lb

## 2013-01-21 DIAGNOSIS — N529 Male erectile dysfunction, unspecified: Secondary | ICD-10-CM

## 2013-01-21 DIAGNOSIS — N400 Enlarged prostate without lower urinary tract symptoms: Secondary | ICD-10-CM

## 2013-01-21 DIAGNOSIS — E291 Testicular hypofunction: Secondary | ICD-10-CM

## 2013-01-21 DIAGNOSIS — F411 Generalized anxiety disorder: Secondary | ICD-10-CM

## 2013-01-21 DIAGNOSIS — R030 Elevated blood-pressure reading, without diagnosis of hypertension: Secondary | ICD-10-CM

## 2013-01-21 DIAGNOSIS — F3289 Other specified depressive episodes: Secondary | ICD-10-CM

## 2013-01-21 DIAGNOSIS — F329 Major depressive disorder, single episode, unspecified: Secondary | ICD-10-CM

## 2013-01-21 MED ORDER — FLUOXETINE HCL 10 MG PO TABS: 10.0000 mg | ORAL_TABLET | Freq: Every day | ORAL | Status: DC

## 2013-01-21 MED ORDER — TESTOSTERONE CYPIONATE 200 MG/ML IM SOLN
200.0000 mg | Freq: Once | INTRAMUSCULAR | Status: AC
  Administered 2013-01-21: 200 mg via INTRAMUSCULAR

## 2013-01-21 NOTE — Progress Notes (Signed)
  Subjective:    Patient ID: John Sharp, male    DOB: [birth year], 52 y.o.   MRN: [id number]  HPI Patient presents to clinic with wife to talk about mood/anxiety/depression.  Testosterone levels have not been stable. He has changed brands 3 times and we orginally were not checking at the right times. He feels very moody. He is anxious most of time but some days feel like he does not want to get out of bed. He is not concerned with symptoms as much as his wife his. She states "he is really hard to live with". Denies any suicidal thoughts. Never been on any meds.     Would like a referral to Urologist. Was seen by one in the past for ED,BPH, hypogonadism.       Review of Systems     Objective:   Physical Exam  Constitutional: He is oriented to person, place, and time. He appears well-developed and well-nourished.  HENT:  Head: Normocephalic and atraumatic.  Cardiovascular: Normal rate, regular rhythm and normal heart sounds.   Pulmonary/Chest: Effort normal and breath sounds normal.  Neurological: He is alert and oriented to person, place, and time.  Skin: Skin is warm and dry.  Psychiatric: He has a normal mood and affect. His behavior is normal.          Assessment & Plan:  Male hypogonadism- Shot given. Will recheck levels in 1 week to see if stable. Will refer to urology Dr. Katrinka Blazing.   Anxiety/Depression/mood issues- Will start Prozac 10mg  daily follow up in 4 to 6 weeks for recheck. Call if worsening depression or anxiety occur. Aware needs time to get in system and feels better. Discussed how exercise could help feel better.   Elevated blood pressure- discussed low salt diet. Pt does not have history of elevated BP. Will not treat at this time and will continue to monitor.

## 2013-01-21 NOTE — Patient Instructions (Addendum)
Start prozac daily. Let me know if improving.   1.5 Gram Low Sodium Diet A 1.5 gram sodium diet restricts the amount of sodium in the diet to no more than 1.5 g or 1500 mg daily. The American Heart Association recommends Americans over the age of 47 to consume no more than 1500 mg of sodium each day to reduce the risk of developing high blood pressure. Research also shows that limiting sodium may reduce heart attack and stroke risk. Many foods contain sodium for flavor and sometimes as a preservative. When the amount of sodium in a diet needs to be low, it is important to know what to look for when choosing foods and drinks. The following includes some information and guidelines to help make it easier for you to adapt to a low sodium diet. QUICK TIPS  Do not add salt to food.  Avoid convenience items and fast food.  Choose unsalted snack foods.  Buy lower sodium products, often labeled as "lower sodium" or "no salt added."  Check food labels to learn how much sodium is in 1 serving.  When eating at a restaurant, ask that your food be prepared with less salt or none, if possible. READING FOOD LABELS FOR SODIUM INFORMATION The nutrition facts label is a good place to find how much sodium is in foods. Look for products with no more than 400 mg of sodium per serving. Remember that 1.5 g = 1500 mg. The food label may also list foods as:  Sodium-free: Less than 5 mg in a serving.  Very low sodium: 35 mg or less in a serving.  Low-sodium: 140 mg or less in a serving.  Light in sodium: 50% less sodium in a serving. For example, if a food that usually has 300 mg of sodium is changed to become light in sodium, it will have 150 mg of sodium.  Reduced sodium: 25% less sodium in a serving. For example, if a food that usually has 400 mg of sodium is changed to reduced sodium, it will have 300 mg of sodium. CHOOSING FOODS Grains  Avoid: Salted crackers and snack items. Some cereals, including  instant hot cereals. Bread stuffing and biscuit mixes. Seasoned rice or pasta mixes.  Choose: Unsalted snack items. Low-sodium cereals, oats, puffed wheat and rice, shredded wheat. English muffins and bread. Pasta. Meats  Avoid: Salted, canned, smoked, spiced, pickled meats, including fish and poultry. Bacon, ham, sausage, cold cuts, hot dogs, anchovies.  Choose: Low-sodium canned tuna and salmon. Fresh or frozen meat, poultry, and fish. Dairy  Avoid: Processed cheese and spreads. Cottage cheese. Buttermilk and condensed milk. Regular cheese.  Choose: Milk. Low-sodium cottage cheese. Yogurt. Sour cream. Low-sodium cheese. Fruits and Vegetables  Avoid: Regular canned vegetables. Regular canned tomato sauce and paste. Frozen vegetables in sauces. Olives. Rosita Fire. Relishes. Sauerkraut.  Choose: Low-sodium canned vegetables. Low-sodium tomato sauce and paste. Frozen or fresh vegetables. Fresh and frozen fruit. Condiments  Avoid: Canned and packaged gravies. Worcestershire sauce. Tartar sauce. Barbecue sauce. Soy sauce. Steak sauce. Ketchup. Onion, garlic, and table salt. Meat flavorings and tenderizers.  Choose: Fresh and dried herbs and spices. Low-sodium varieties of mustard and ketchup. Lemon juice. Tabasco sauce. Horseradish. SAMPLE 1.5 GRAM SODIUM MEAL PLAN Breakfast / Sodium (mg)  1 cup low-fat milk / 143 mg  1 whole-wheat English muffin / 240 mg  1 tbs heart-healthy margarine / 153 mg  1 hard-boiled egg / 139 mg  1 small orange / 0 mg Lunch / Sodium (mg)  1 cup raw carrots / 76 mg  2 tbs no salt added peanut butter / 5 mg  2 slices whole-wheat bread / 270 mg  1 tbs jelly / 6 mg   cup red grapes / 2 mg Dinner / Sodium (mg)  1 cup whole-wheat pasta / 2 mg  1 cup low-sodium tomato sauce / 73 mg  3 oz lean ground beef / 57 mg  1 small side salad (1 cup raw spinach leaves,  cup cucumber,  cup yellow bell pepper) with 1 tsp olive oil and 1 tsp red wine vinegar /  25 mg Snack / Sodium (mg)  1 container low-fat vanilla yogurt / 107 mg  3 graham cracker squares / 127 mg Nutrient Analysis  Calories: 1745  Protein: 75 g  Carbohydrate: 237 g  Fat: 57 g  Sodium: 1425 mg Document Released: 10/27/2005 Document Revised: 01/19/2012 Document Reviewed: 01/28/2010 [facility] Patient Information 2013 [place], [place].

## 2013-01-28 ENCOUNTER — Other Ambulatory Visit: Payer: Self-pay | Admitting: *Deleted

## 2013-01-28 MED ORDER — FLUOXETINE HCL 10 MG PO TABS: 10.0000 mg | ORAL_TABLET | Freq: Every day | ORAL | Status: DC

## 2013-02-04 ENCOUNTER — Ambulatory Visit (INDEPENDENT_AMBULATORY_CARE_PROVIDER_SITE_OTHER): Admitting: Physician Assistant

## 2013-02-04 DIAGNOSIS — E291 Testicular hypofunction: Secondary | ICD-10-CM

## 2013-02-04 MED ORDER — TESTOSTERONE CYPIONATE 200 MG/ML IM SOLN
200.0000 mg | Freq: Once | INTRAMUSCULAR | Status: AC
  Administered 2013-02-04: 200 mg via INTRAMUSCULAR

## 2013-02-04 NOTE — Progress Notes (Signed)
  Subjective:    Patient ID: John Sharp, male    DOB: [birth year], 52 y.o.   MRN: [id number] Testosterone injection given IM. Pt tolerated well with no complications. Barry Dienes, LPN  HPI    Review of Systems     Objective:   Physical Exam        Assessment & Plan:  Pt was given testosterone shots without complications. Patient is to return in 2 weeks for next injection. Tandy Gaw PA-c

## 2013-02-07 LAB — TESTOSTERONE, FREE, TOTAL, SHBG: Testosterone-% Free: 2.6 % (ref 1.6–2.9)

## 2013-02-10 ENCOUNTER — Telehealth: Payer: Self-pay | Admitting: *Deleted

## 2013-02-10 DIAGNOSIS — E291 Testicular hypofunction: Secondary | ICD-10-CM

## 2013-02-10 NOTE — Telephone Encounter (Signed)
Testosterone lab ordered.  

## 2013-02-14 LAB — TESTOSTERONE, FREE, TOTAL, SHBG
Testosterone, Free: 143.7 pg/mL (ref 47.0–244.0)
Testosterone-% Free: 3 % — ABNORMAL HIGH (ref 1.6–2.9)
Testosterone: 481 ng/dL (ref 300–890)

## 2013-02-18 ENCOUNTER — Ambulatory Visit (INDEPENDENT_AMBULATORY_CARE_PROVIDER_SITE_OTHER): Admitting: Physician Assistant

## 2013-02-18 VITALS — BP 120/77 | HR 93 | Wt 238.0 lb

## 2013-02-18 DIAGNOSIS — E291 Testicular hypofunction: Secondary | ICD-10-CM

## 2013-02-18 MED ORDER — TESTOSTERONE CYPIONATE 200 MG/ML IM SOLN
200.0000 mg | Freq: Once | INTRAMUSCULAR | Status: AC
  Administered 2013-02-18: 200 mg via INTRAMUSCULAR

## 2013-02-18 NOTE — Patient Instructions (Signed)
Return in (2) weeks for next injection. 

## 2013-02-18 NOTE — Progress Notes (Signed)
Pt denies any SOB or Chest pain.Heath Gold   Testosterone injection given without complications. Follow up in 2 weeks. Tandy Gaw PA-c

## 2013-03-04 ENCOUNTER — Ambulatory Visit (INDEPENDENT_AMBULATORY_CARE_PROVIDER_SITE_OTHER): Admitting: Physician Assistant

## 2013-03-04 ENCOUNTER — Ambulatory Visit: Admitting: Physician Assistant

## 2013-03-04 VITALS — BP 159/105 | HR 78

## 2013-03-04 DIAGNOSIS — E291 Testicular hypofunction: Secondary | ICD-10-CM

## 2013-03-04 MED ORDER — TESTOSTERONE CYPIONATE 200 MG/ML IM SOLN
200.0000 mg | Freq: Once | INTRAMUSCULAR | Status: AC
  Administered 2013-03-04: 200 mg via INTRAMUSCULAR

## 2013-03-04 NOTE — Progress Notes (Signed)
  Subjective:    Patient ID: John Sharp, male    DOB: [birth year], 52 y.o.   MRN: [id number] Testosterone injection HPI    Review of Systems     Objective:   Physical Exam       Assessment & Plan:  Patient was given testosterone injection without complications. Follow up in 2 weeks. Jade breeback PA-C.

## 2013-03-13 ENCOUNTER — Other Ambulatory Visit: Payer: Self-pay | Admitting: Physician Assistant

## 2013-03-18 ENCOUNTER — Ambulatory Visit (INDEPENDENT_AMBULATORY_CARE_PROVIDER_SITE_OTHER): Admitting: Physician Assistant

## 2013-03-18 VITALS — BP 122/74 | HR 82

## 2013-03-18 DIAGNOSIS — E291 Testicular hypofunction: Secondary | ICD-10-CM

## 2013-03-18 MED ORDER — TESTOSTERONE CYPIONATE 200 MG/ML IM SOLN
200.0000 mg | Freq: Once | INTRAMUSCULAR | Status: AC
  Administered 2013-03-18: 200 mg via INTRAMUSCULAR

## 2013-03-18 NOTE — Progress Notes (Signed)
  Subjective:    Patient ID: John Sharp, male    DOB: 1961-03-05, 52 y.o.   MRN: [id number] Pt in for testosterone injection.  IM.  Given with no complications. HPI    Review of Systems     Objective:   Physical Exam        Assessment & Plan:

## 2013-03-22 ENCOUNTER — Ambulatory Visit

## 2013-03-25 ENCOUNTER — Other Ambulatory Visit: Payer: Self-pay | Admitting: *Deleted

## 2013-03-25 MED ORDER — IBUPROFEN-FAMOTIDINE 800-26.6 MG PO TABS: 1.0000 | ORAL_TABLET | Freq: Three times a day (TID) | ORAL | Status: DC

## 2013-04-01 ENCOUNTER — Ambulatory Visit (INDEPENDENT_AMBULATORY_CARE_PROVIDER_SITE_OTHER): Admitting: Family Medicine

## 2013-04-01 DIAGNOSIS — E291 Testicular hypofunction: Secondary | ICD-10-CM

## 2013-04-01 MED ORDER — TESTOSTERONE CYPIONATE 200 MG/ML IM SOLN
200.0000 mg | Freq: Once | INTRAMUSCULAR | Status: AC
  Administered 2013-04-01: 200 mg via INTRAMUSCULAR

## 2013-04-01 NOTE — Progress Notes (Signed)
  Subjective:    Patient ID: John Sharp, male    DOB: [birth year], 52 y.o.   MRN: [id number] Testosterone injection given IM. Pt tolerated well without difficulty. Barry Dienes, LPN  HPI    Review of Systems     Objective:   Physical Exam        Assessment & Plan:

## 2013-04-08 ENCOUNTER — Other Ambulatory Visit: Payer: Self-pay | Admitting: Physician Assistant

## 2013-04-08 MED ORDER — AMOXICILLIN-POT CLAVULANATE 875-125 MG PO TABS: 1.0000 | ORAL_TABLET | Freq: Two times a day (BID) | ORAL | Status: DC

## 2013-04-15 ENCOUNTER — Ambulatory Visit (INDEPENDENT_AMBULATORY_CARE_PROVIDER_SITE_OTHER): Admitting: Physician Assistant

## 2013-04-15 ENCOUNTER — Encounter: Payer: Self-pay | Admitting: *Deleted

## 2013-04-15 DIAGNOSIS — E291 Testicular hypofunction: Secondary | ICD-10-CM

## 2013-04-15 MED ORDER — TESTOSTERONE CYPIONATE 200 MG/ML IM SOLN
200.0000 mg | Freq: Once | INTRAMUSCULAR | Status: AC
  Administered 2013-04-15: 200 mg via INTRAMUSCULAR

## 2013-04-15 NOTE — Progress Notes (Signed)
  Subjective:    Patient ID: John Sharp, male    DOB: 03-16-1961, 52 y.o.   MRN: [id number] Testosterone injection given IM right ventrogluteal. Pt tolerated well without complications. Barry Dienes, LPN  HPI    Review of Systems     Objective:   Physical Exam        Assessment & Plan:  hypogonadisim- INjection given without complication. Follow up in 2 weeks for next injection. Tandy Gaw PA-C

## 2013-04-29 ENCOUNTER — Encounter: Payer: Self-pay | Admitting: *Deleted

## 2013-04-29 ENCOUNTER — Ambulatory Visit (INDEPENDENT_AMBULATORY_CARE_PROVIDER_SITE_OTHER): Admitting: Physician Assistant

## 2013-04-29 DIAGNOSIS — E291 Testicular hypofunction: Secondary | ICD-10-CM

## 2013-04-29 MED ORDER — TESTOSTERONE CYPIONATE 200 MG/ML IM SOLN
200.0000 mg | Freq: Once | INTRAMUSCULAR | Status: AC
  Administered 2013-04-29: 200 mg via INTRAMUSCULAR

## 2013-04-29 NOTE — Progress Notes (Signed)
  Subjective:    Patient ID: John Sharp, male    DOB: 10/01/1961, 52 y.o.   MRN: [id number] Testosterone injection given IM. Pt tolerated well without complications. Barry Dienes, LPN  HPI    Review of Systems     Objective:   Physical Exam        Assessment & Plan:  Testosterone injection given. RTC in 2 weeks. Tandy Gaw PA-C

## 2013-05-16 ENCOUNTER — Ambulatory Visit

## 2013-05-16 ENCOUNTER — Ambulatory Visit: Admitting: Family Medicine

## 2013-05-20 ENCOUNTER — Telehealth: Payer: Self-pay | Admitting: Physician Assistant

## 2013-05-20 ENCOUNTER — Ambulatory Visit (INDEPENDENT_AMBULATORY_CARE_PROVIDER_SITE_OTHER): Admitting: Physician Assistant

## 2013-05-20 VITALS — BP 144/88 | HR 88

## 2013-05-20 DIAGNOSIS — E291 Testicular hypofunction: Secondary | ICD-10-CM

## 2013-05-20 MED ORDER — TESTOSTERONE CYPIONATE 200 MG/ML IM SOLN
200.0000 mg | Freq: Once | INTRAMUSCULAR | Status: AC
  Administered 2013-05-20: 200 mg via INTRAMUSCULAR

## 2013-05-20 NOTE — Telephone Encounter (Signed)
Will need blood work before next appt just tell pt that he can come to appt and I will order from there. Last fasting labs were done in august if he would like to wait until then.

## 2013-05-20 NOTE — Progress Notes (Signed)
  Subjective:    Patient ID: John Sharp, male    DOB: 06/11/1961, 52 y.o.   MRN: [id number]  HPI   Here for testosterone injection  Review of Systems     Objective:   Physical Exam        Assessment & Plan:  Given without complication 

## 2013-05-25 NOTE — Telephone Encounter (Signed)
Patients wife notified in office today.

## 2013-06-03 ENCOUNTER — Ambulatory Visit (INDEPENDENT_AMBULATORY_CARE_PROVIDER_SITE_OTHER): Admitting: Physician Assistant

## 2013-06-03 ENCOUNTER — Encounter: Payer: Self-pay | Admitting: Physician Assistant

## 2013-06-03 VITALS — BP 138/93 | HR 89 | Wt 230.0 lb

## 2013-06-03 DIAGNOSIS — E291 Testicular hypofunction: Secondary | ICD-10-CM

## 2013-06-03 DIAGNOSIS — D2330 Other benign neoplasm of skin of unspecified part of face: Secondary | ICD-10-CM

## 2013-06-03 DIAGNOSIS — R209 Unspecified disturbances of skin sensation: Secondary | ICD-10-CM

## 2013-06-03 DIAGNOSIS — D2230 Melanocytic nevi of unspecified part of face: Secondary | ICD-10-CM

## 2013-06-03 DIAGNOSIS — B86 Scabies: Secondary | ICD-10-CM

## 2013-06-03 DIAGNOSIS — R202 Paresthesia of skin: Secondary | ICD-10-CM

## 2013-06-03 MED ORDER — TESTOSTERONE CYPIONATE 200 MG/ML IM SOLN
200.0000 mg | Freq: Once | INTRAMUSCULAR | Status: AC
  Administered 2013-06-03: 200 mg via INTRAMUSCULAR

## 2013-06-03 MED ORDER — PREDNISONE 50 MG PO TABS: ORAL_TABLET | ORAL | Status: DC

## 2013-06-03 MED ORDER — PERMETHRIN 5 % EX CREA: TOPICAL_CREAM | Freq: Once | CUTANEOUS | Status: DC

## 2013-06-03 MED ORDER — CYCLOBENZAPRINE HCL 10 MG PO TABS: 10.0000 mg | ORAL_TABLET | Freq: Two times a day (BID) | ORAL | Status: DC | PRN

## 2013-06-03 NOTE — Progress Notes (Addendum)
  Subjective:    Patient ID: John Sharp, male    DOB: 1960-12-19, 52 y.o.   MRN: [id number]  HPI Patient presents to the clinic to get testosterone injection and to address left arm and hand numbness, itching, some concerning spots of face.   Per patient on and off left arm numbness and tingling extending into left hand has been going on for at least 6 months to a year. Pt has a history of back problems with slipped disc. He previously has had multiple injections in his back that have not helped. He has just learned to deal with pain and discomfort. He neck is achy but no real pain. Symptoms are worse in the morning but happen anytime. His arm feel asleep while in waiting room. 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 He has done nothing to try and help. Certain propping up positions seem to make worse. No imaging has been done on c-spine.  He is having a lot of itching on arms and some on legs. He denies any poison ivy exposure. He has not taken anything to make better and nothing seems to make worse. It is constant. No new exposures in house to detergent, soaps, sprays. No other contacts in home with same symptoms. No fever, chills.  Pt does have a nevus on side of left temple that is getting bigger and wife feels like color inside is changing. No bleeding from mole.      Review of Systems     Objective:   Physical Exam  Constitutional: He is oriented to person, place, and time. He appears well-developed and well-nourished.  HENT:  Head: Normocephalic and atraumatic.  Cardiovascular: Normal rate, regular rhythm and normal heart sounds.   Pulmonary/Chest: Effort normal and breath sounds normal.  Musculoskeletal:  Neck Normal ROM. No pain with palpation over c-spine. No pain over left shoulder on around joints. Hand grip 5/5. Strength 5/5.   Neurological: He is alert and oriented to person, place, and time.  Skin:     Burrows in between thumb and first finger on left hand.   Psychiatric:  He has a normal mood and affect. His behavior is normal.          Assessment & Plan:  Left shoulder numbness and tingling/left hand numbness- I suspect a slipped disc in neck due to history of impingement. Gave prednisone for 5 days. Does not take NSAIDS well but does have Duexis he can take. Gave home stretches. If not improving in next 2 weeks. Need to get xrays and then consider other options.   Rash/pruitis- I suspect scabies due to burrows and area of distribution of itching. Gave elimite to use. Gave handout on how to clean house to avoid re-infestation.   Atypical nevus of face- concerned because growing in size and does have some abnormal coloring to it. Pt is concerned and I do not want to excise it because of it being on face. Will refer to dermatology.likely benign.  Male hypogonadism- INjection given today without complications.

## 2013-06-03 NOTE — Patient Instructions (Addendum)
Shoulder Exercises EXERCISES  RANGE OF MOTION (ROM) AND STRETCHING EXERCISES These exercises may help you when beginning to rehabilitate your injury. Your symptoms may resolve with or without further involvement from your physician, physical therapist or athletic trainer. While completing these exercises, remember:   Restoring tissue flexibility helps normal motion to return to the joints. This allows healthier, less painful movement and activity.  An effective stretch should be held for at least 30 seconds.  A stretch should never be painful. You should only feel a gentle lengthening or release in the stretched tissue. ROM - Pendulum  Bend at the waist so that your right / left arm falls away from your body. Support yourself with your opposite hand on a solid surface, such as a table or a countertop.  Your right / left arm should be perpendicular to the ground. If it is not perpendicular, you need to lean over farther. Relax the muscles in your right / left arm and shoulder as much as possible.  Gently sway your hips and trunk so they move your right / left arm without any use of your right / left shoulder muscles.  Progress your movements so that your right / left arm moves side to side, then forward and backward, and finally, both clockwise and counterclockwise.  Complete __________ repetitions in each direction. Many people use this exercise to relieve discomfort in their shoulder as well as to gain range of motion. Repeat __________ times. Complete this exercise __________ times per day. STRETCH  Flexion, Standing  Stand with good posture. With an underhand grip on your right / left hand and an overhand grip on the opposite hand, grasp a broomstick or cane so that your hands are a little more than shoulder-width apart.  Keeping your right / left elbow straight and shoulder muscles relaxed, push the stick with your opposite hand to raise your right / left arm in front of your body  and then overhead. Raise your arm until you feel a stretch in your right / left shoulder, but before you have increased shoulder pain.  Try to avoid shrugging your right / left shoulder as your arm rises by keeping your shoulder blade tucked down and toward your mid-back spine. Hold __________ seconds.  Slowly return to the starting position. Repeat __________ times. Complete this exercise __________ times per day. STRETCH - Internal Rotation  Place your right / left hand behind your back, palm-up.  Throw a towel or belt over your opposite shoulder. Grasp the towel/belt with your right / left hand.  While keeping an upright posture, gently pull up on the towel/belt until you feel a stretch in the front of your right / left shoulder.  Avoid shrugging your right / left shoulder as your arm rises by keeping your shoulder blade tucked down and toward your mid-back spine.  Hold __________. Release the stretch by lowering your opposite hand. Repeat __________ times. Complete this exercise __________ times per day. STRETCH - External Rotation and Abduction  Stagger your stance through a doorframe. It does not matter which foot is forward.  As instructed by your physician, physical therapist or athletic trainer, place your hands:  And forearms above your head and on the door frame.  And forearms at head-height and on the door frame.  At elbow-height and on the door frame.  Keeping your head and chest upright and your stomach muscles tight to prevent over-extending your low-back, slowly shift your weight onto your front foot until you  feel a stretch across your chest and/or in the front of your shoulders.  Hold __________ seconds. Shift your weight to your back foot to release the stretch. Repeat __________ times. Complete this stretch __________ times per day.  STRENGTHENING EXERCISES  These exercises may help you when beginning to rehabilitate your injury. They may resolve your symptoms  with or without further involvement from your physician, physical therapist or athletic trainer. While completing these exercises, remember:   Muscles can gain both the endurance and the strength needed for everyday activities through controlled exercises.  Complete these exercises as instructed by your physician, physical therapist or athletic trainer. Progress the resistance and repetitions only as guided.  You may experience muscle soreness or fatigue, but the pain or discomfort you are trying to eliminate should never worsen during these exercises. If this pain does worsen, stop and make certain you are following the directions exactly. If the pain is still present after adjustments, discontinue the exercise until you can discuss the trouble with your clinician.  If advised by your physician, during your recovery, avoid activity or exercises which involve actions that place your right / left hand or elbow above your head or behind your back or head. These positions stress the tissues which are trying to heal. STRENGTH - Scapular Depression and Adduction  With good posture, sit on a firm chair. Supported your arms in front of you with pillows, arm rests or a table top. Have your elbows in line with the sides of your body.  Gently draw your shoulder blades down and toward your mid-back spine. Gradually increase the tension without tensing the muscles along the top of your shoulders and the back of your neck.  Hold for __________ seconds. Slowly release the tension and relax your muscles completely before completing the next repetition.  After you have practiced this exercise, remove the arm support and complete it in standing as well as sitting. Repeat __________ times. Complete this exercise __________ times per day.  STRENGTH - External Rotators  Secure a rubber exercise band/tubing to a fixed object so that it is at the same height as your right / left elbow when you are standing or sitting  on a firm surface.  Stand or sit so that the secured exercise band/tubing is at your side that is not injured.  Bend your elbow 90 degrees. Place a folded towel or small pillow under your right / left arm so that your elbow is a few inches away from your side.  Keeping the tension on the exercise band/tubing, pull it away from your body, as if pivoting on your elbow. Be sure to keep your body steady so that the movement is only coming from your shoulder rotating.  Hold __________ seconds. Release the tension in a controlled manner as you return to the starting position. Repeat __________ times. Complete this exercise __________ times per day.  STRENGTH - Supraspinatus  Stand or sit with good posture. Grasp a __________ weight or an exercise band/tubing so that your hand is "thumbs-up," like when you shake hands.  Slowly lift your right / left hand from your thigh into the air, traveling about 30 degrees from straight out at your side. Lift your hand to shoulder height or as far as you can without increasing any shoulder pain. Initially, many people do not lift their hands above shoulder height.  Avoid shrugging your right / left shoulder as your arm rises by keeping your shoulder blade tucked down and toward  your mid-back spine.  Hold for __________ seconds. Control the descent of your hand as you slowly return to your starting position. Repeat __________ times. Complete this exercise __________ times per day.  STRENGTH - Shoulder Extensors  Secure a rubber exercise band/tubing so that it is at the height of your shoulders when you are either standing or sitting on a firm arm-less chair.  With a thumbs-up grip, grasp an end of the band/tubing in each hand. Straighten your elbows and lift your hands straight in front of you at shoulder height. Step back away from the secured end of band/tubing until it becomes tense.  Squeezing your shoulder blades together, pull your hands down to the sides  of your thighs. Do not allow your hands to go behind you.  Hold for __________ seconds. Slowly ease the tension on the band/tubing as you reverse the directions and return to the starting position. Repeat __________ times. Complete this exercise __________ times per day.  STRENGTH - Scapular Retractors  Secure a rubber exercise band/tubing so that it is at the height of your shoulders when you are either standing or sitting on a firm arm-less chair.  With a palm-down grip, grasp an end of the band/tubing in each hand. Straighten your elbows and lift your hands straight in front of you at shoulder height. Step back away from the secured end of band/tubing until it becomes tense.  Squeezing your shoulder blades together, draw your elbows back as you bend them. Keep your upper arm lifted away from your body throughout the exercise.  Hold __________ seconds. Slowly ease the tension on the band/tubing as you reverse the directions and return to the starting position. Repeat __________ times. Complete this exercise __________ times per day. STRENGTH  Scapular Depressors  Find a sturdy chair without wheels, such as a from a dining room table.  Keeping your feet on the floor, lift your bottom from the seat and lock your elbows.  Keeping your elbows straight, allow gravity to pull your body weight down. Your shoulders will rise toward your ears.  Raise your body against gravity by drawing your shoulder blades down your back, shortening the distance between your shoulders and ears. Although your feet should always maintain contact with the floor, your feet should progressively support less body weight as you get stronger.  Hold __________ seconds. In a controlled and slow manner, lower your body weight to begin the next repetition. Repeat __________ times. Complete this exercise __________ times per day.  Document Released: 09/10/2005 Document Revised: 01/19/2012 Document Reviewed:  02/08/2009 [facility] Patient Information 2014 [place], [place].  Scabies Scabies are small bugs (mites) that burrow under the skin and cause red bumps and severe itching. These bugs can only be seen with a microscope. Scabies are highly contagious. They can spread easily from person to person by direct contact. They are also spread through sharing clothing or linens that have the scabies mites living in them. It is not unusual for an entire family to become infected through shared towels, clothing, or bedding.  HOME CARE INSTRUCTIONS   Your caregiver may prescribe a cream or lotion to kill the mites. If cream is prescribed, massage the cream into the entire body from the neck to the bottom of both feet. Also massage the cream into the scalp and face if your child is less than 25 year old. Avoid the eyes and mouth. Do not wash your hands after application.  Leave the cream on for 8 to 12 hours.  Your child should bathe or shower after the 8 to 12 hour application period. Sometimes it is helpful to apply the cream to your child right before bedtime.  One treatment is usually effective and will eliminate approximately 95% of infestations. For severe cases, your caregiver may decide to repeat the treatment in 1 week. Everyone in your household should be treated with one application of the cream.  New rashes or burrows should not appear within 24 to 48 hours after successful treatment. However, the itching and rash may last for 2 to 4 weeks after successful treatment. Your caregiver may prescribe a medicine to help with the itching or to help the rash go away more quickly.  Scabies can live on clothing or linens for up to 3 days. All of your child's recently used clothing, towels, stuffed toys, and bed linens should be washed in hot water and then dried in a dryer for at least 20 minutes on high heat. Items that cannot be washed should be enclosed in a plastic bag for at least 3 days.  To help relieve  itching, bathe your child in a cool bath or apply cool washcloths to the affected areas.  Your child may return to school after treatment with the prescribed cream. SEEK MEDICAL CARE IF:   The itching persists longer than 4 weeks after treatment.  The rash spreads or becomes infected. Signs of infection include red blisters or yellow-tan crust. Document Released: 10/27/2005 Document Revised: 01/19/2012 Document Reviewed: 03/07/2009 [facility] Patient Information 2014 [place], [place].

## 2013-06-06 NOTE — Addendum Note (Signed)
Addended byJomarie Longs on: 06/06/2013 08:30 AM   Modules accepted: Level of Service

## 2013-06-09 ENCOUNTER — Other Ambulatory Visit: Payer: Self-pay | Admitting: Physician Assistant

## 2013-06-17 ENCOUNTER — Ambulatory Visit (INDEPENDENT_AMBULATORY_CARE_PROVIDER_SITE_OTHER): Admitting: Physician Assistant

## 2013-06-17 ENCOUNTER — Encounter: Payer: Self-pay | Admitting: *Deleted

## 2013-06-17 VITALS — BP 134/93 | HR 85 | Wt 233.0 lb

## 2013-06-17 DIAGNOSIS — E291 Testicular hypofunction: Secondary | ICD-10-CM

## 2013-06-17 MED ORDER — TESTOSTERONE CYPIONATE 200 MG/ML IM SOLN
200.0000 mg | Freq: Once | INTRAMUSCULAR | Status: AC
  Administered 2013-06-17: 200 mg via INTRAMUSCULAR

## 2013-06-17 NOTE — Progress Notes (Signed)
  Subjective:    Patient ID: John Sharp, male    DOB: [birth year], 52 y.o.   MRN: [id number] Pt here for testosterone injection.  200mg  given IM. No complications.  Donne Anon, CMA HPI    Review of Systems     Objective:   Physical Exam        Assessment & Plan:  Needs CPE and blood work in next month or so. Will send lab slip to lab. Tandy Gaw PA-C

## 2013-07-01 ENCOUNTER — Ambulatory Visit (INDEPENDENT_AMBULATORY_CARE_PROVIDER_SITE_OTHER): Admitting: Physician Assistant

## 2013-07-01 VITALS — BP 144/99 | HR 97

## 2013-07-01 DIAGNOSIS — E291 Testicular hypofunction: Secondary | ICD-10-CM

## 2013-07-01 MED ORDER — TESTOSTERONE CYPIONATE 200 MG/ML IM SOLN
200.0000 mg | Freq: Once | INTRAMUSCULAR | Status: AC
  Administered 2013-07-01: 200 mg via INTRAMUSCULAR

## 2013-07-01 NOTE — Progress Notes (Signed)
  Subjective:    Patient ID: John Sharp, male    DOB: Jan 18, 1961, 52 y.o.   MRN: [id number]  HPI   Here for testosterone injection  Review of Systems     Objective:   Physical Exam        Assessment & Plan:  Given without complication

## 2013-07-15 ENCOUNTER — Ambulatory Visit (INDEPENDENT_AMBULATORY_CARE_PROVIDER_SITE_OTHER): Admitting: Physician Assistant

## 2013-07-15 ENCOUNTER — Encounter: Payer: Self-pay | Admitting: *Deleted

## 2013-07-15 VITALS — BP 148/94 | HR 80

## 2013-07-15 DIAGNOSIS — Z23 Encounter for immunization: Secondary | ICD-10-CM

## 2013-07-15 DIAGNOSIS — E291 Testicular hypofunction: Secondary | ICD-10-CM

## 2013-07-15 MED ORDER — TESTOSTERONE CYPIONATE 200 MG/ML IM SOLN
200.0000 mg | Freq: Once | INTRAMUSCULAR | Status: AC
  Administered 2013-07-15: 200 mg via INTRAMUSCULAR

## 2013-07-15 NOTE — Progress Notes (Signed)
  Subjective:    Patient ID: John Sharp, male    DOB: 1961-05-30, 52 y.o.   MRN: [id number] Pt here for testosterone injection.  200mg  given IM with no complications.  Donne Anon, CMA HPI    Review of Systems     Objective:   Physical Exam        Assessment & Plan:  Flu shot and testosterone injection given today without any complications. Tandy Gaw PA-C

## 2013-07-22 ENCOUNTER — Telehealth: Payer: Self-pay | Admitting: *Deleted

## 2013-07-22 DIAGNOSIS — E291 Testicular hypofunction: Secondary | ICD-10-CM

## 2013-07-22 DIAGNOSIS — Z0000 Encounter for general adult medical examination without abnormal findings: Secondary | ICD-10-CM

## 2013-07-22 LAB — CBC WITH DIFFERENTIAL/PLATELET
Basophils Absolute: 0 10*3/uL (ref 0.0–0.1)
Basophils Relative: 1 % (ref 0–1)
Lymphocytes Relative: 34 % (ref 12–46)
MCHC: 34.4 g/dL (ref 30.0–36.0)
Neutro Abs: 2.1 10*3/uL (ref 1.7–7.7)
Neutrophils Relative %: 51 % (ref 43–77)
RDW: 13.4 % (ref 11.5–15.5)
WBC: 4.1 10*3/uL (ref 4.0–10.5)

## 2013-07-22 LAB — COMPLETE METABOLIC PANEL WITH GFR
ALT: 26 U/L (ref 0–53)
AST: 23 U/L (ref 0–37)
CO2: 28 mEq/L (ref 19–32)
Calcium: 9.2 mg/dL (ref 8.4–10.5)
Chloride: 103 mEq/L (ref 96–112)
Creat: 0.94 mg/dL (ref 0.50–1.35)
GFR, Est African American: >89 mL/min
Sodium: 138 mEq/L (ref 135–145)
Total Protein: 6.7 g/dL (ref 6.0–8.3)

## 2013-07-22 LAB — PSA, TOTAL AND FREE
PSA, Free Pct: 17 % — ABNORMAL LOW (ref >25)
PSA, Free: 0.61 ng/mL
PSA: 3.5 ng/mL (ref <=4.00)

## 2013-07-22 LAB — LIPID PANEL
HDL: 39 mg/dL — ABNORMAL LOW (ref >39)
LDL Cholesterol: 107 mg/dL — ABNORMAL HIGH (ref 0–99)
Triglycerides: 100 mg/dL (ref <150)
VLDL: 20 mg/dL (ref 0–40)

## 2013-07-22 NOTE — Telephone Encounter (Signed)
Labs ordered.

## 2013-07-29 ENCOUNTER — Encounter: Payer: Self-pay | Admitting: Physician Assistant

## 2013-07-29 ENCOUNTER — Ambulatory Visit (INDEPENDENT_AMBULATORY_CARE_PROVIDER_SITE_OTHER): Admitting: Physician Assistant

## 2013-07-29 ENCOUNTER — Ambulatory Visit (INDEPENDENT_AMBULATORY_CARE_PROVIDER_SITE_OTHER)

## 2013-07-29 VITALS — BP 132/87 | HR 85 | Wt 233.0 lb

## 2013-07-29 DIAGNOSIS — M4802 Spinal stenosis, cervical region: Secondary | ICD-10-CM

## 2013-07-29 DIAGNOSIS — R200 Anesthesia of skin: Secondary | ICD-10-CM

## 2013-07-29 DIAGNOSIS — Z0000 Encounter for general adult medical examination without abnormal findings: Secondary | ICD-10-CM

## 2013-07-29 DIAGNOSIS — E291 Testicular hypofunction: Secondary | ICD-10-CM

## 2013-07-29 DIAGNOSIS — M5030 Other cervical disc degeneration, unspecified cervical region: Secondary | ICD-10-CM

## 2013-07-29 DIAGNOSIS — F329 Major depressive disorder, single episode, unspecified: Secondary | ICD-10-CM

## 2013-07-29 DIAGNOSIS — R209 Unspecified disturbances of skin sensation: Secondary | ICD-10-CM

## 2013-07-29 DIAGNOSIS — M5380 Other specified dorsopathies, site unspecified: Secondary | ICD-10-CM

## 2013-07-29 DIAGNOSIS — F32A Depression, unspecified: Secondary | ICD-10-CM | POA: Insufficient documentation

## 2013-07-29 MED ORDER — TRAMADOL HCL 50 MG PO TABS: 50.0000 mg | ORAL_TABLET | Freq: Four times a day (QID) | ORAL | Status: DC | PRN

## 2013-07-29 MED ORDER — METAXALONE 800 MG PO TABS: 800.0000 mg | ORAL_TABLET | Freq: Three times a day (TID) | ORAL | Status: DC

## 2013-07-29 MED ORDER — ESOMEPRAZOLE MAGNESIUM 40 MG PO CPDR: 40.0000 mg | DELAYED_RELEASE_CAPSULE | Freq: Two times a day (BID) | ORAL | Status: DC

## 2013-07-29 MED ORDER — TESTOSTERONE CYPIONATE 200 MG/ML IM SOLN
200.0000 mg | Freq: Once | INTRAMUSCULAR | Status: AC
  Administered 2013-07-29: 200 mg via INTRAMUSCULAR

## 2013-07-29 MED ORDER — FLUOXETINE HCL 10 MG PO TABS: 10.0000 mg | ORAL_TABLET | Freq: Every day | ORAL | Status: DC

## 2013-07-29 NOTE — Progress Notes (Addendum)
  Subjective:    Patient ID: John Sharp, male    DOB: [birth year], 52 y.o.   MRN: [id number]  HPI Patient is a 52 yo male who presents to the clinic for CPE.   Left arm tingling and pain ongoing for last month or so. Per pt always had back problems but never neck. Pain starts at base of neck and travels down left arm into middle-pinky fingers. Pain 4-5 out of 10. Worse when working at computer all day. Flexeril does not help. Does not take any meds regularly for pain.   Review of Systems  All other systems reviewed and are negative.       Objective:   Physical Exam  BP 132/87  Pulse 85  Wt 233 lb (105.688 kg)  BMI 31.59 kg/m2  General Appearance:    Alert, cooperative, no distress, appears stated age  Head:    Normocephalic, without obvious abnormality, atraumatic  Eyes:    PERRL, conjunctiva/corneas clear, EOM's intact, fundi    benign, both eyes       Ears:    Normal TM's and external ear canals, both ears  Nose:   Nares normal, septum midline, mucosa normal, no drainage    or sinus tenderness  Throat:   Lips, mucosa, and tongue normal; teeth and gums normal  Neck:   Supple, symmetrical, trachea midline, no adenopathy;       thyroid:  No enlargement/tenderness/nodules; no carotid   bruit or JVD  Back:     Symmetric, no curvature, ROM normal, no CVA tenderness  Lungs:     Clear to auscultation bilaterally, respirations unlabored  Chest wall:    No tenderness or deformity  Heart:    Regular rate and rhythm, S1 and S2 normal, no murmur, rub   or gallop  Abdomen:     Soft, non-tender, bowel sounds active all four quadrants,    no masses, no organomegaly  Genitalia:    Normal male without lesion, discharge or tenderness  Rectal:    Normal tone, normal prostate, no masses or tenderness;   guaiac negative stool  Extremities:   Extremities normal, atraumatic, no cyanosis or edema. ROM of left arm full and without pain. ROM neck with pain when neck moves to the right. No pain over  c-spine. Tightness of muscles of upper back.   Pulses:   2+ and symmetric all extremities  Skin:   Skin color, texture, turgor normal, no rashes or lesions  Lymph nodes:   Cervical, supraclavicular, and axillary nodes normal  Neurologic:   CNII-XII intact. Normal strength, sensation and reflexes      throughout        Assessment & Plan:  CPE- Vaccines up to date. Fasting labs done and reviewed. Discuss regular exercise and calcium and vitamin D. EKG routine done: NRS at 79bpm, Incomplete RBBB, inverted t wave at V1, no arrhythmia or ST elevation.   Left arm tingling/pain- i suspect some DDD changes at c-spine. Will get xrays today. Discussed ibuprofen more regularly. Stretches and ROM exercises for neck. Ice/heat as needed. Will give skelaxin to try for muscle relaxer. Tramadol was given for breakthrough pain.   Hypogonadism- testosterone shot given today. We discussed trying Aveed. Will talk to rep and see if pt is approved.   Depression- controlled. Will refill prozac.

## 2013-07-29 NOTE — Patient Instructions (Addendum)

## 2013-08-01 ENCOUNTER — Other Ambulatory Visit: Payer: Self-pay | Admitting: Physician Assistant

## 2013-08-01 DIAGNOSIS — M792 Neuralgia and neuritis, unspecified: Secondary | ICD-10-CM

## 2013-08-01 DIAGNOSIS — M4802 Spinal stenosis, cervical region: Secondary | ICD-10-CM

## 2013-08-01 MED ORDER — PREDNISONE 50 MG PO TABS: ORAL_TABLET | ORAL | Status: DC

## 2013-08-02 ENCOUNTER — Encounter: Payer: Self-pay | Admitting: *Deleted

## 2013-08-06 ENCOUNTER — Ambulatory Visit (HOSPITAL_BASED_OUTPATIENT_CLINIC_OR_DEPARTMENT_OTHER)
Admission: RE | Admit: 2013-08-06 | Discharge: 2013-08-06 | Disposition: A | Discharge status: Home / Self Care | Source: Ambulatory Visit | Attending: Physician Assistant | Admitting: Physician Assistant

## 2013-08-06 ENCOUNTER — Other Ambulatory Visit: Payer: Self-pay | Admitting: Physician Assistant

## 2013-08-06 DIAGNOSIS — M4802 Spinal stenosis, cervical region: Secondary | ICD-10-CM

## 2013-08-06 DIAGNOSIS — R209 Unspecified disturbances of skin sensation: Secondary | ICD-10-CM | POA: Insufficient documentation

## 2013-08-06 DIAGNOSIS — Z1389 Encounter for screening for other disorder: Secondary | ICD-10-CM | POA: Insufficient documentation

## 2013-08-06 DIAGNOSIS — M792 Neuralgia and neuritis, unspecified: Secondary | ICD-10-CM

## 2013-08-06 DIAGNOSIS — M5030 Other cervical disc degeneration, unspecified cervical region: Secondary | ICD-10-CM | POA: Insufficient documentation

## 2013-08-06 DIAGNOSIS — IMO0002 Reserved for concepts with insufficient information to code with codable children: Secondary | ICD-10-CM

## 2013-08-10 ENCOUNTER — Other Ambulatory Visit: Payer: Self-pay | Admitting: Physician Assistant

## 2013-08-10 DIAGNOSIS — M4802 Spinal stenosis, cervical region: Secondary | ICD-10-CM

## 2013-08-12 ENCOUNTER — Ambulatory Visit (INDEPENDENT_AMBULATORY_CARE_PROVIDER_SITE_OTHER): Admitting: Physician Assistant

## 2013-08-12 ENCOUNTER — Encounter: Payer: Self-pay | Admitting: *Deleted

## 2013-08-12 ENCOUNTER — Other Ambulatory Visit: Payer: Self-pay | Admitting: Physician Assistant

## 2013-08-12 VITALS — BP 128/72 | HR 84

## 2013-08-12 DIAGNOSIS — E291 Testicular hypofunction: Secondary | ICD-10-CM

## 2013-08-12 MED ORDER — TESTOSTERONE CYPIONATE 200 MG/ML IM SOLN
200.0000 mg | Freq: Once | INTRAMUSCULAR | Status: AC
  Administered 2013-08-12: 200 mg via INTRAMUSCULAR

## 2013-08-12 MED ORDER — HYDROCODONE-ACETAMINOPHEN 5-325 MG PO TABS: 1.0000 | ORAL_TABLET | Freq: Three times a day (TID) | ORAL | Status: DC | PRN

## 2013-08-12 NOTE — Progress Notes (Signed)
Pt reports tramadol not working. Has appt with surgeon.

## 2013-08-12 NOTE — Progress Notes (Signed)
  Subjective:    Patient ID: John Sharp, male    DOB: May 11, 1961, 52 y.o.   MRN: [id number] Pt in for testosterone injection.  200mg  given RUOQ.  No complications.  Donne Anon, CMA HPI    Review of Systems     Objective:   Physical Exam        Assessment & Plan:

## 2013-08-15 ENCOUNTER — Other Ambulatory Visit: Payer: Self-pay | Admitting: Physician Assistant

## 2013-08-26 ENCOUNTER — Encounter: Payer: Self-pay | Admitting: *Deleted

## 2013-08-26 ENCOUNTER — Ambulatory Visit (INDEPENDENT_AMBULATORY_CARE_PROVIDER_SITE_OTHER): Admitting: Physician Assistant

## 2013-08-26 VITALS — BP 135/97 | HR 92

## 2013-08-26 DIAGNOSIS — E291 Testicular hypofunction: Secondary | ICD-10-CM

## 2013-08-26 MED ORDER — TESTOSTERONE CYPIONATE 200 MG/ML IM SOLN
200.0000 mg | Freq: Once | INTRAMUSCULAR | Status: AC
  Administered 2013-08-26: 200 mg via INTRAMUSCULAR

## 2013-08-26 NOTE — Progress Notes (Signed)
  Subjective:    Patient ID: John Sharp, male    DOB: 1961-03-13, 52 y.o.   MRN: [id number]  HPI Pt here for testosterone injection    Review of Systems     Objective:   Physical Exam        Assessment & Plan:  Given without complication.

## 2013-09-09 ENCOUNTER — Telehealth: Payer: Self-pay | Admitting: Physician Assistant

## 2013-09-09 ENCOUNTER — Ambulatory Visit (INDEPENDENT_AMBULATORY_CARE_PROVIDER_SITE_OTHER): Admitting: Physician Assistant

## 2013-09-09 ENCOUNTER — Encounter: Payer: Self-pay | Admitting: *Deleted

## 2013-09-09 VITALS — BP 154/105 | HR 84

## 2013-09-09 DIAGNOSIS — E291 Testicular hypofunction: Secondary | ICD-10-CM

## 2013-09-09 MED ORDER — TESTOSTERONE CYPIONATE 200 MG/ML IM SOLN
200.0000 mg | INTRAMUSCULAR | Status: DC
  Administered 2013-09-09: 200 mg via INTRAMUSCULAR

## 2013-09-09 NOTE — Telephone Encounter (Signed)
Notified pt. 

## 2013-09-09 NOTE — Telephone Encounter (Signed)
Please call pt: Let him know BP was elevated today. Will check again in 2 weeks. Low salt diet. If remains elevated will need to consider medication.

## 2013-09-09 NOTE — Progress Notes (Signed)
  Subjective:    Patient ID: John Sharp, male    DOB: July 10, 1961, 52 y.o.   MRN: [id number]  HPI    Review of Systems     Objective:   Physical Exam        Assessment & Plan:  Testosterone injection was given without complication today. BP was elevated. Pt called and told to keep a low salt diet and keep a check on it. IN two weeks need to consider medication if continues to be elevated. Tandy Gaw PA-C

## 2013-09-09 NOTE — Progress Notes (Signed)
Patient was seen for testosterone injection. 200 mg was given right ventrogluteal area. Rhonda Cunningham,CMA

## 2013-09-11 ENCOUNTER — Other Ambulatory Visit: Payer: Self-pay | Admitting: Physician Assistant

## 2013-09-23 ENCOUNTER — Ambulatory Visit (INDEPENDENT_AMBULATORY_CARE_PROVIDER_SITE_OTHER): Admitting: Physician Assistant

## 2013-09-23 ENCOUNTER — Encounter: Payer: Self-pay | Admitting: *Deleted

## 2013-09-23 ENCOUNTER — Telehealth: Payer: Self-pay | Admitting: *Deleted

## 2013-09-23 VITALS — BP 128/79 | HR 79

## 2013-09-23 DIAGNOSIS — E291 Testicular hypofunction: Secondary | ICD-10-CM

## 2013-09-23 MED ORDER — TESTOSTERONE CYPIONATE 200 MG/ML IM SOLN
200.0000 mg | Freq: Once | INTRAMUSCULAR | Status: AC
  Administered 2013-09-23: 200 mg via INTRAMUSCULAR

## 2013-09-23 NOTE — Progress Notes (Signed)
  Subjective:    Patient ID: John Sharp, male    DOB: 13-Apr-1961, 52 y.o.   MRN: [id number] Testosterone given IM LUOQ.  No complications.  Donne Anon, CMA HPI    Review of Systems     Objective:   Physical Exam        Assessment & Plan:

## 2013-09-23 NOTE — Telephone Encounter (Signed)
Pt has been feeling more tired lately & he has to come in a few days early for his next injection due to the holiday.  He wants to know if the dosage will have to be adjusted since it'll be early.  And also, should we check his levels again? Please advise

## 2013-09-27 NOTE — Telephone Encounter (Signed)
We can recheck testosterone it has been 7 months. He needs to have that drawn this week to get accurate dose. Yes it is ok for pt to come on Wednesday for testosterone injection for next week.

## 2013-09-27 NOTE — Telephone Encounter (Signed)
Labs ordered pt notified

## 2013-09-30 ENCOUNTER — Other Ambulatory Visit: Payer: Self-pay | Admitting: Physician Assistant

## 2013-09-30 ENCOUNTER — Telehealth: Payer: Self-pay | Admitting: *Deleted

## 2013-09-30 MED ORDER — HYDROCODONE-HOMATROPINE 5-1.5 MG/5ML PO SYRP: 5.0000 mL | ORAL_SOLUTION | Freq: Every evening | ORAL | Status: DC | PRN

## 2013-09-30 NOTE — Telephone Encounter (Signed)
Will send cough syrup and follow up if not improving by Monday.

## 2013-09-30 NOTE — Telephone Encounter (Signed)
Pt.notified

## 2013-09-30 NOTE — Telephone Encounter (Signed)
Pt's wife in office today stating that he has a terrible cough & just overall feels bad.  Your schedule is full today so he couldn't come in.

## 2013-10-03 ENCOUNTER — Encounter: Payer: Self-pay | Admitting: Physician Assistant

## 2013-10-03 ENCOUNTER — Ambulatory Visit (INDEPENDENT_AMBULATORY_CARE_PROVIDER_SITE_OTHER): Admitting: Physician Assistant

## 2013-10-03 VITALS — BP 136/84 | HR 85 | Temp 98.0°F | Wt 240.0 lb

## 2013-10-03 DIAGNOSIS — J209 Acute bronchitis, unspecified: Secondary | ICD-10-CM

## 2013-10-03 DIAGNOSIS — H9201 Otalgia, right ear: Secondary | ICD-10-CM

## 2013-10-03 DIAGNOSIS — H9209 Otalgia, unspecified ear: Secondary | ICD-10-CM

## 2013-10-03 LAB — TESTOSTERONE, FREE, TOTAL, SHBG
Sex Hormone Binding: 12 nmol/L — ABNORMAL LOW (ref 13–71)
Testosterone, Free: 147.5 pg/mL (ref 47.0–244.0)
Testosterone: 463 ng/dL (ref 300–890)

## 2013-10-03 MED ORDER — MOMETASONE FUROATE 50 MCG/ACT NA SUSP: 2.0000 | Freq: Every day | NASAL | Status: DC

## 2013-10-03 MED ORDER — AZITHROMYCIN 250 MG PO TABS: ORAL_TABLET | ORAL | Status: DC

## 2013-10-03 MED ORDER — BENZONATATE 100 MG PO CAPS: 100.0000 mg | ORAL_CAPSULE | Freq: Three times a day (TID) | ORAL | Status: DC | PRN

## 2013-10-03 NOTE — Progress Notes (Signed)
  Subjective:    Patient ID: John Sharp, male    DOB: 09-Jun-1961, 52 y.o.   MRN: [id number]  HPI Patient is a 52 year old male who presents to the clinic with a cough for the last week and a half. Patient called in on Friday and I see him Hycodan at bedtime. Patient does report that has helped some. Patient still is having production of yellow and green sputum. Patient denies any sinus pressure or or sore throat. He does have right sided ear pain. He has not been using his nasal spray. Cough is worse at night and in the morning. He has also tried some Mucinex over-the-counter which seems to help minimally. He denies any fever, chills, nausea, vomiting.   Review of Systems     Objective:   Physical Exam  Constitutional: He is oriented to person, place, and time. He appears well-developed and well-nourished.  HENT:  Head: Normocephalic and atraumatic.  Left and right TM were clear with air bubbles present behind.  Oropharynx erythematous with postnasal drip present. Tonsils not swollen and no exudate found.  Bilateral nares are red and swollen.  No maxillary or frontal sinus tenderness to palpation.  Eyes: Conjunctivae are normal. Right eye exhibits no discharge. Left eye exhibits no discharge.  Neck: Normal range of motion. Neck supple.  Mild cervical adenopathy of bilateral cervical lymph nodes.  Cardiovascular: Normal rate, regular rhythm and normal heart sounds.   Pulmonary/Chest: Effort normal and breath sounds normal. He has no wheezes.  Productive cough heard on exam.  Abdominal: Soft. Bowel sounds are normal.  Neurological: He is alert and oriented to person, place, and time.  Skin: Skin is warm and dry.  Psychiatric: He has a normal mood and affect. His behavior is normal.          Assessment & Plan:  Acute bronchitis/right ear pain- patient was instructed to continue on Hycodan at bedtime. Patient was given Jerilynn Som to use during the day for cough. Patient was  started on a Z-Pak for bronchitis. Per patient he has an albuterol inhaler 2 puffs at home and was instructed to use at night and in the morning and 4 uncontrolled coughing. Refilled for Nasonex was sent to the pharmacy. I do think your pain is coming from fluid buildup in ears from eustation tube dysfunction. I hope nasal spray will help symptoms. Call if not improving worsened worsening.

## 2013-10-10 ENCOUNTER — Ambulatory Visit: Admitting: Family Medicine

## 2013-10-10 VITALS — BP 145/94 | HR 83

## 2013-10-10 DIAGNOSIS — E291 Testicular hypofunction: Secondary | ICD-10-CM

## 2013-10-10 MED ORDER — TESTOSTERONE CYPIONATE 200 MG/ML IM SOLN: 200.0000 mg | Freq: Once | INTRAMUSCULAR | Status: DC

## 2013-10-10 MED ORDER — HYDROCODONE-HOMATROPINE 5-1.5 MG/5ML PO SYRP: 5.0000 mL | ORAL_SOLUTION | Freq: Every evening | ORAL | Status: DC | PRN

## 2013-10-24 ENCOUNTER — Ambulatory Visit

## 2013-10-26 ENCOUNTER — Encounter: Payer: Self-pay | Admitting: *Deleted

## 2013-10-26 ENCOUNTER — Other Ambulatory Visit: Payer: Self-pay | Admitting: Physician Assistant

## 2013-10-26 ENCOUNTER — Ambulatory Visit (INDEPENDENT_AMBULATORY_CARE_PROVIDER_SITE_OTHER): Admitting: Physician Assistant

## 2013-10-26 VITALS — BP 116/71 | HR 91 | Wt 239.0 lb

## 2013-10-26 DIAGNOSIS — E291 Testicular hypofunction: Secondary | ICD-10-CM

## 2013-10-26 MED ORDER — TESTOSTERONE CYPIONATE 200 MG/ML IM SOLN
200.0000 mg | Freq: Once | INTRAMUSCULAR | Status: DC
  Administered 2013-10-26: 200 mg via INTRAMUSCULAR

## 2013-10-26 MED ORDER — TESTOSTERONE CYPIONATE 200 MG/ML IM SOLN
200.0000 mg | INTRAMUSCULAR | Status: DC
  Administered 2013-10-26: 200 mg via INTRAMUSCULAR

## 2013-10-26 NOTE — Progress Notes (Signed)
Pt here for testosterone inj. Injection given Pt tolerated well.Loralee Pacas [place]

## 2013-11-09 ENCOUNTER — Ambulatory Visit (INDEPENDENT_AMBULATORY_CARE_PROVIDER_SITE_OTHER): Admitting: Physician Assistant

## 2013-11-09 ENCOUNTER — Encounter: Payer: Self-pay | Admitting: *Deleted

## 2013-11-09 VITALS — BP 149/112 | HR 85 | Wt 239.0 lb

## 2013-11-09 DIAGNOSIS — E291 Testicular hypofunction: Secondary | ICD-10-CM

## 2013-11-09 MED ORDER — TESTOSTERONE CYPIONATE 200 MG/ML IM SOLN
200.0000 mg | Freq: Once | INTRAMUSCULAR | Status: AC
  Administered 2013-11-09: 200 mg via INTRAMUSCULAR

## 2013-11-09 NOTE — Progress Notes (Signed)
   Subjective:    Patient ID: John Sharp, male    DOB: 10/02/1961, 52 y.o.   MRN: [id number]  HPI   Here for testosterone injection Review of Systems     Objective:   Physical Exam        Assessment & Plan:  Given without complication

## 2013-11-25 ENCOUNTER — Encounter: Payer: Self-pay | Admitting: *Deleted

## 2013-11-25 ENCOUNTER — Ambulatory Visit (INDEPENDENT_AMBULATORY_CARE_PROVIDER_SITE_OTHER): Admitting: Family Medicine

## 2013-11-25 VITALS — BP 134/90 | HR 82 | Wt 239.0 lb

## 2013-11-25 DIAGNOSIS — E291 Testicular hypofunction: Secondary | ICD-10-CM

## 2013-11-25 MED ORDER — TESTOSTERONE CYPIONATE 200 MG/ML IM SOLN
200.0000 mg | INTRAMUSCULAR | Status: DC
  Administered 2013-11-25: 200 mg via INTRAMUSCULAR

## 2013-11-25 NOTE — Progress Notes (Signed)
Patient was in office for Testosterone injection. 200 mg depo testosterone given LUOQ. Patient denied any headaches, mood changes or SOB. Rhonda Cunningham,CMA

## 2013-12-09 ENCOUNTER — Encounter: Payer: Self-pay | Admitting: *Deleted

## 2013-12-09 ENCOUNTER — Ambulatory Visit (INDEPENDENT_AMBULATORY_CARE_PROVIDER_SITE_OTHER): Admitting: Physician Assistant

## 2013-12-09 VITALS — BP 123/80 | HR 93 | Wt 241.0 lb

## 2013-12-09 DIAGNOSIS — E291 Testicular hypofunction: Secondary | ICD-10-CM

## 2013-12-09 MED ORDER — TESTOSTERONE CYPIONATE 200 MG/ML IM SOLN
200.0000 mg | Freq: Once | INTRAMUSCULAR | Status: AC
  Administered 2013-12-09: 200 mg via INTRAMUSCULAR

## 2013-12-09 MED ORDER — TESTOSTERONE CYPIONATE 200 MG/ML IM SOLN: INTRAMUSCULAR | Status: DC

## 2013-12-09 NOTE — Progress Notes (Signed)
   Subjective:    Patient ID: John Sharp, male    DOB: 1961/04/09, 53 y.o.   MRN: [id number] Testosterone injection given IM RUOQ. No complications. Beatris Ship, CMA HPI    Review of Systems     Objective:   Physical Exam        Assessment & Plan:

## 2013-12-19 ENCOUNTER — Other Ambulatory Visit: Payer: Self-pay | Admitting: Physician Assistant

## 2013-12-23 ENCOUNTER — Ambulatory Visit (INDEPENDENT_AMBULATORY_CARE_PROVIDER_SITE_OTHER): Admitting: Physician Assistant

## 2013-12-23 ENCOUNTER — Encounter: Payer: Self-pay | Admitting: *Deleted

## 2013-12-23 VITALS — BP 133/86 | HR 88 | Wt 244.0 lb

## 2013-12-23 DIAGNOSIS — E291 Testicular hypofunction: Secondary | ICD-10-CM

## 2013-12-23 MED ORDER — TESTOSTERONE CYPIONATE 200 MG/ML IM SOLN
200.0000 mg | Freq: Once | INTRAMUSCULAR | Status: AC
  Administered 2013-12-23: 200 mg via INTRAMUSCULAR

## 2013-12-23 NOTE — Progress Notes (Signed)
Testosterone inj given in LUOQ pt tolerated well no SOB or CP pt to return to clinic in 2 wks  .John Sharp [place]

## 2013-12-24 ENCOUNTER — Encounter: Payer: Self-pay | Admitting: Physician Assistant

## 2013-12-30 ENCOUNTER — Other Ambulatory Visit: Payer: Self-pay | Admitting: Physician Assistant

## 2013-12-30 ENCOUNTER — Other Ambulatory Visit: Payer: Self-pay | Admitting: *Deleted

## 2013-12-30 ENCOUNTER — Telehealth: Payer: Self-pay | Admitting: *Deleted

## 2013-12-30 MED ORDER — ALBUTEROL SULFATE HFA 108 (90 BASE) MCG/ACT IN AERS: 2.0000 | INHALATION_SPRAY | Freq: Four times a day (QID) | RESPIRATORY_TRACT | Status: DC | PRN

## 2013-12-30 MED ORDER — HYDROCOD POLST-CHLORPHEN POLST 10-8 MG/5ML PO LQCR: 5.0000 mL | Freq: Four times a day (QID) | ORAL | Status: DC | PRN

## 2013-12-30 NOTE — Telephone Encounter (Signed)
rx ordered.

## 2014-01-06 ENCOUNTER — Other Ambulatory Visit: Payer: Self-pay | Admitting: Physician Assistant

## 2014-01-06 ENCOUNTER — Ambulatory Visit

## 2014-01-06 ENCOUNTER — Ambulatory Visit (INDEPENDENT_AMBULATORY_CARE_PROVIDER_SITE_OTHER)

## 2014-01-06 ENCOUNTER — Ambulatory Visit (INDEPENDENT_AMBULATORY_CARE_PROVIDER_SITE_OTHER): Admitting: Physician Assistant

## 2014-01-06 ENCOUNTER — Encounter: Payer: Self-pay | Admitting: Physician Assistant

## 2014-01-06 VITALS — BP 129/76 | HR 85 | Ht 72.0 in | Wt 241.0 lb

## 2014-01-06 DIAGNOSIS — E291 Testicular hypofunction: Secondary | ICD-10-CM

## 2014-01-06 DIAGNOSIS — K219 Gastro-esophageal reflux disease without esophagitis: Secondary | ICD-10-CM

## 2014-01-06 DIAGNOSIS — Z008 Encounter for other general examination: Secondary | ICD-10-CM

## 2014-01-06 DIAGNOSIS — R05 Cough: Secondary | ICD-10-CM

## 2014-01-06 DIAGNOSIS — R059 Cough, unspecified: Secondary | ICD-10-CM

## 2014-01-06 MED ORDER — TESTOSTERONE CYPIONATE 200 MG/ML IM SOLN
200.0000 mg | Freq: Once | INTRAMUSCULAR | Status: AC
  Administered 2014-01-06: 200 mg via INTRAMUSCULAR

## 2014-01-06 NOTE — Patient Instructions (Signed)
Get CXR.  Will get into [facility] at [facility].  Start dexilant to replace nexium.  Start zyrtec/claritin d daily.

## 2014-01-06 NOTE — Progress Notes (Signed)
   Subjective:    Patient ID: John Sharp, male    DOB: [birth year], 53 y.o.   MRN: [id number]  HPI Pt presents with his wife to the clinic to get testosterone shot and to discuss ongoing cough. Cough going on for 2 or more months. Recently gotten so bad he is coughing until he vomits. It is worse after eating and during the day. He has tried cough syrup, albuterol and nothing makes better. On duexis and nexium daily. Wife feels like he is not taking daily but he reports he is. Cough is productive with clear/white sputum. Denies any fever, chills, sinus pressure, ear pain. Hx of GERD.    Review of Systems     Objective:   Physical Exam  Constitutional: He is oriented to person, place, and time. He appears well-developed and well-nourished.  HENT:  Head: Normocephalic and atraumatic.  Right Ear: External ear normal.  Left Ear: External ear normal.  Nose: Nose normal.  Mouth/Throat: Oropharynx is clear and moist.  Eyes: Conjunctivae are normal.  Neck: Normal range of motion. Neck supple.  Cardiovascular: Normal rate, regular rhythm and normal heart sounds.   Pulmonary/Chest: Effort normal and breath sounds normal. He has no wheezes.  Lymphadenopathy:    He has no cervical adenopathy.  Neurological: He is alert and oriented to person, place, and time.  Psychiatric: He has a normal mood and affect. His behavior is normal.          Assessment & Plan:  Male hypogonadism- testosterone shot given without complication.   Cough- unclear etiology does not seem infectous. Has failed cough syrup treatment. Peak flows were in the green. Would like to get spirometry. Not able to work machine today will come back next week. Will get CXR. Certainly GERD is on the differential. Will switch to dexilant daily instead of nexium. Will get into digestive health for endoscopy to evaluate for worsening GERD. Let me know if dexilant is better. Could also be some post nasal drip add zyrtec to singulair.  Will continue to monitor. Use albuterol as needed. Call if worsening. Not wearing CPAP. Encouraged use of CPAP nightly.

## 2014-01-06 NOTE — Telephone Encounter (Signed)
What is this med used for? Is it ok to fill

## 2014-01-07 ENCOUNTER — Other Ambulatory Visit: Payer: Self-pay | Admitting: Physician Assistant

## 2014-01-11 ENCOUNTER — Encounter: Payer: Self-pay | Admitting: Physician Assistant

## 2014-01-11 ENCOUNTER — Ambulatory Visit (INDEPENDENT_AMBULATORY_CARE_PROVIDER_SITE_OTHER): Admitting: Physician Assistant

## 2014-01-11 VITALS — BP 129/75 | HR 77 | Wt 242.0 lb

## 2014-01-11 DIAGNOSIS — R059 Cough, unspecified: Secondary | ICD-10-CM

## 2014-01-11 DIAGNOSIS — R05 Cough: Secondary | ICD-10-CM

## 2014-01-11 DIAGNOSIS — E669 Obesity, unspecified: Secondary | ICD-10-CM

## 2014-01-11 MED ORDER — HYDROCODONE-HOMATROPINE 5-1.5 MG/5ML PO SYRP: 5.0000 mL | ORAL_SOLUTION | Freq: Four times a day (QID) | ORAL | Status: DC | PRN

## 2014-01-11 MED ORDER — MONTELUKAST SODIUM 10 MG PO TABS: 10.0000 mg | ORAL_TABLET | Freq: Every day | ORAL | Status: DC

## 2014-01-11 NOTE — Patient Instructions (Addendum)
Start zyrtec D/claritin D daily with singulair. Go back to nexium daily.

## 2014-01-11 NOTE — Progress Notes (Signed)
   Subjective:    Patient ID: John Sharp, male    DOB: [birth year], 53 y.o.   MRN: [id number]  HPI Here for spirometry for evaluation of cough. Continues to have cough. Did not like tussinonex. Continues not use albuterol inhaler or CPAP. Exposure to dust is daily. Pt denies wheezing but wife states he wheezes during sex and exertion.    Review of Systems     Objective:   Physical Exam  Constitutional: He is oriented to person, place, and time. He appears well-developed and well-nourished.  HENT:  Head: Normocephalic and atraumatic.  Cardiovascular: Normal rate, regular rhythm and normal heart sounds.   Pulmonary/Chest: Effort normal and breath sounds normal. He has no wheezes.  Neurological: He is alert and oriented to person, place, and time.  Skin: Skin is dry.  Psychiatric: He has a normal mood and affect. His behavior is normal.          Assessment & Plan:  Cough- unclear etiology. Pt also has GERD. No real benefit from dexilant advised pt he go back to nexium and see if any change. We wait for endoscopy results. Spirometry- no signs of COPD. FEV1 % was 82. It is possible pt has some mild asthma symptoms in response to allergies spironmetry was not able to calculate percent of change today. Start singulair daily with zyrtec D. Use albuterol in response to cough to see if helps with symptoms. If it does and GI report is good will consider starting on ICS. Refilled hycodan but discussed could not continue to give since is consider narcotic. Only use for bad cough at night.   Sleep apnea/obesity- discussed with pt if cannot wear mask then need to work on weight loss to possiblity eliminate sleep apnea. Pt aware. Discussed regular exercise and working on portion control. Follow up if would like to consider referral or medication.

## 2014-01-20 ENCOUNTER — Ambulatory Visit (INDEPENDENT_AMBULATORY_CARE_PROVIDER_SITE_OTHER): Admitting: Physician Assistant

## 2014-01-20 VITALS — BP 123/72 | HR 77 | Wt 242.0 lb

## 2014-01-20 DIAGNOSIS — E291 Testicular hypofunction: Secondary | ICD-10-CM

## 2014-01-20 MED ORDER — TESTOSTERONE CYPIONATE 200 MG/ML IM SOLN
200.0000 mg | Freq: Once | INTRAMUSCULAR | Status: AC
  Administered 2014-01-20: 200 mg via INTRAMUSCULAR

## 2014-01-20 NOTE — Progress Notes (Signed)
Pt here for testosterone injection no SOB,CP or mood swings. Injection tolerated well given.Audelia Hives Nason

## 2014-02-03 ENCOUNTER — Encounter: Payer: Self-pay | Admitting: *Deleted

## 2014-02-03 ENCOUNTER — Ambulatory Visit (INDEPENDENT_AMBULATORY_CARE_PROVIDER_SITE_OTHER): Admitting: Physician Assistant

## 2014-02-03 VITALS — BP 146/99 | HR 84 | Wt 242.0 lb

## 2014-02-03 DIAGNOSIS — E291 Testicular hypofunction: Secondary | ICD-10-CM

## 2014-02-03 MED ORDER — TESTOSTERONE CYPIONATE 200 MG/ML IM SOLN
200.0000 mg | Freq: Once | INTRAMUSCULAR | Status: AC
  Administered 2014-02-03: 200 mg via INTRAMUSCULAR

## 2014-02-03 NOTE — Progress Notes (Signed)
   Subjective:    Patient ID: John Sharp, male    DOB: 01/17/1961, 53 y.o.   MRN: [id number]  HPI   Pt is here for testosterone injection Review of Systems     Objective:   Physical Exam        Assessment & Plan:  Given without complication  

## 2014-02-07 ENCOUNTER — Encounter: Payer: Self-pay | Admitting: Physician Assistant

## 2014-02-17 ENCOUNTER — Ambulatory Visit (INDEPENDENT_AMBULATORY_CARE_PROVIDER_SITE_OTHER): Admitting: Physician Assistant

## 2014-02-17 ENCOUNTER — Encounter: Payer: Self-pay | Admitting: *Deleted

## 2014-02-17 VITALS — BP 125/75 | HR 74 | Wt 240.0 lb

## 2014-02-17 DIAGNOSIS — E291 Testicular hypofunction: Secondary | ICD-10-CM

## 2014-02-17 MED ORDER — TESTOSTERONE CYPIONATE 200 MG/ML IM SOLN
200.0000 mg | Freq: Once | INTRAMUSCULAR | Status: AC
  Administered 2014-02-17: 200 mg via INTRAMUSCULAR

## 2014-02-17 NOTE — Progress Notes (Signed)
   Subjective:    Patient ID: John Sharp, male    DOB: December 24, 1960, 53 y.o.   MRN: [id number]  HPI   Pt is here for testosterone injection Review of Systems     Objective:   Physical Exam        Assessment & Plan:  Given without complication

## 2014-03-01 ENCOUNTER — Other Ambulatory Visit: Payer: Self-pay | Admitting: Physician Assistant

## 2014-03-03 ENCOUNTER — Ambulatory Visit (INDEPENDENT_AMBULATORY_CARE_PROVIDER_SITE_OTHER): Admitting: Physician Assistant

## 2014-03-03 VITALS — BP 135/98 | HR 78 | Wt 240.0 lb

## 2014-03-03 DIAGNOSIS — E291 Testicular hypofunction: Secondary | ICD-10-CM

## 2014-03-03 MED ORDER — HYDROCODONE-ACETAMINOPHEN 5-325 MG PO TABS: 1.0000 | ORAL_TABLET | Freq: Three times a day (TID) | ORAL | Status: DC | PRN

## 2014-03-03 MED ORDER — TESTOSTERONE CYPIONATE 200 MG/ML IM SOLN
200.0000 mg | INTRAMUSCULAR | Status: DC
  Administered 2014-03-03: 200 mg via INTRAMUSCULAR

## 2014-03-03 NOTE — Progress Notes (Addendum)
Patient was in office for testosterone injection. 200 mg was given RUOQ. Patient denied any headaches, SOB or dizziness. Joslynne Klatt,CMA   Testosterone injection given without any complications. Iran Planas PA-C Pt is not able to take NSAIDs due to endoscopy results, GERD, and possible barrets esophagus. Pt is having ongoing pain. Gave number 30 norco today.

## 2014-03-03 NOTE — Addendum Note (Signed)
Addended by: Donella Stade on: 03/03/2014 01:34 PM   Modules accepted: Orders

## 2014-03-06 ENCOUNTER — Other Ambulatory Visit: Payer: Self-pay | Admitting: Physician Assistant

## 2014-03-06 MED ORDER — PANTOPRAZOLE SODIUM 40 MG PO TBEC: 40.0000 mg | DELAYED_RELEASE_TABLET | Freq: Every day | ORAL | Status: DC

## 2014-03-06 NOTE — Progress Notes (Signed)
Call pt: let him know that protonix was sent to pharmacy. Stop nexium.

## 2014-03-06 NOTE — Progress Notes (Signed)
Pt's wife notified.

## 2014-03-08 ENCOUNTER — Encounter: Payer: Self-pay | Admitting: Physician Assistant

## 2014-03-08 ENCOUNTER — Ambulatory Visit (INDEPENDENT_AMBULATORY_CARE_PROVIDER_SITE_OTHER): Admitting: Physician Assistant

## 2014-03-08 VITALS — BP 116/69 | HR 81 | Ht 72.0 in | Wt 234.0 lb

## 2014-03-08 DIAGNOSIS — L570 Actinic keratosis: Secondary | ICD-10-CM

## 2014-03-08 DIAGNOSIS — K219 Gastro-esophageal reflux disease without esophagitis: Secondary | ICD-10-CM

## 2014-03-08 DIAGNOSIS — L821 Other seborrheic keratosis: Secondary | ICD-10-CM

## 2014-03-08 DIAGNOSIS — M7701 Medial epicondylitis, right elbow: Secondary | ICD-10-CM

## 2014-03-08 DIAGNOSIS — M7700 Medial epicondylitis, unspecified elbow: Secondary | ICD-10-CM

## 2014-03-08 MED ORDER — TRAMADOL HCL 50 MG PO TABS: ORAL_TABLET | ORAL | Status: DC

## 2014-03-08 MED ORDER — ALBUTEROL SULFATE HFA 108 (90 BASE) MCG/ACT IN AERS: 2.0000 | INHALATION_SPRAY | Freq: Four times a day (QID) | RESPIRATORY_TRACT | Status: DC | PRN

## 2014-03-08 NOTE — Patient Instructions (Signed)
Actinic Keratosis Actinic keratosis is a precancerous growth on the skin. This means it could develop into skin cancer if it is not treated. About 1% of actinic keratoses turn into skin cancer within a year. It is important to have all such growths removed to prevent them from developing into skin cancer. CAUSES  Actinic keratosis is caused by getting too much ultraviolet (UV) radiation from the sun or other UV light sources. RISK FACTORS Factors that increase your chances of getting actinic keratosis include:  Having light-colored skin and blue eyes.  Having blonde or red hair.  Spending a lot of time in the sun.  Age. The risk of actinic keratosis increases with age. SYMPTOMS  Actinic keratosis growths look like scaly, rough spots of skin. They can be as small as a pinhead or as big as a quarter. They may itch, hurt, or feel sensitive. Sometimes there is a little tag of pink or gray skin growing off them. In some cases, actinic keratoses are easier felt than seen. They do not go away with the use of moisturizing lotions or creams. Actinic keratoses appear most often on areas of skin that get a lot of sun exposure. These areas include the:  Scalp.  Face.  Ears.  Lips.  Upper back.  Backs of the hands.  Forearms. DIAGNOSIS  Your caregiver can usually tell what is wrong by performing a physical exam. A tissue sample (biopsy) may also be taken and examined under a microscope. TREATMENT  Actinic keratosis can be treated several ways. Most treatments can be done in your caregiver's office. Treatment options may include:  Curettage. A tool is used to gently scrape off the growth.  Cryosurgery. Liquid nitrogen is applied to the growth to freeze it. The growth eventually falls off the skin.  Medicated creams, such as 5-fluorouracil or imiquimod. The medicine destroys the cells in the growth.  Chemical peels. Chemicals are applied to the growth and the outer layers of skin are peeled  off.  Photodynamic therapy. A drug that makes your skin more sensitive to light is applied to the skin. A strong, blue light is aimed at the skin and destroys the growth. PREVENTION  To prevent future sun damage:  Try to avoid the sun between 10:00 a.m. and 4:00 p.m. when it is the strongest.  Use a sunscreen or sunblock with SPF 30 or greater.  Apply sunscreen at least 30 minutes before exposure to the sun.  Always wear protective hats, clothing, and sunglasses with UV protection.  Avoid medicines, herbs, and foods that increase your sensitivity to sunlight.  Avoid tanning beds. HOME CARE INSTRUCTIONS   If your skin was covered with a bandage, change and remove the bandage as directed by your caregiver.  Keep the treated area dry as directed by your caregiver.  Apply any creams as prescribed by your caregiver. Follow the directions carefully.  Check your skin regularly for any changes.  Visit a skin doctor (dermatologist) every year for a skin exam. SEEK MEDICAL CARE IF:   Your skin does not heal and becomes irritated, red, or bleeds.  You notice any changes or new growths on your skin. Document Released: 01/23/2009 Document Revised: 01/19/2012 Document Reviewed: 12/08/2011 [facility] Patient Information 2014 [place].   Seborrheic Keratosis Seborrheic keratosis is a common, noncancerous (benign) skin growth that can occur anywhere on the skin.It looks like "stuck-on," waxy, rough, tan, brown, or black spots on the skin. These skin growths can be flat or raised.They are often  called "barnacles" because of their pasted-on appearance.Usually, these skin growths appear in adulthood, around age 67, and increase in number as you age. They may also develop during pregnancy or following estrogen therapy. Many people may only have one growth appear in their lifetime, while some people may develop many growths. CAUSES It is unknown what causes these skin growths, but they  appear to run in families. SYMPTOMS Seborrheic keratosis is often located on the face, chest, shoulders, back, or other areas. These growths are:  Usually painless, but may become irritated and itchy.  Yellow, brown, black, or other colors.  Slightly raised or have a flat surface.  Sometimes rough or wart-like in texture.  Often waxy on the surface.  Round or oval-shaped.  Sometimes "stuck-on" in appearance.  Sometimes single, but there are usually many growths. Any growth that bleeds, itches on a regular basis, becomes inflamed, or becomes irritated needs to be evaluated by a skin specialist (dermatologist). DIAGNOSIS Diagnosis is mainly based on the way the growths appear. In some cases, it can be difficult to tell this type of skin growth from skin cancer. A skin growth tissue sample (biopsy) may be used to confirm the diagnosis. TREATMENT Most often, treatment is not needed because the skin growths are benign.If the skin growth is irritated easily by clothing or jewelry, causing it to scab or bleed, treatment may be recommended. Patients may also choose to have the growths removed because they do not like their appearance. Most commonly, these growths are treated with cryosurgery. In cryosurgery, liquid nitrogen is applied to "freeze" the growth. The growth usually falls off within a matter of days. A blister may form and dry into a scab that will also fall off. After the growth or scab falls off, it may leave a dark or light spot on the skin. This color may fade over time, or it may remain permanent on the skin. HOME CARE INSTRUCTIONS If the skin growths are treated with cryosurgery, the treated area needs to be kept clean with water and soap. SEEK MEDICAL CARE IF:  You have questions about these growths or other skin problems.  You develop new symptoms, including:  A change in the appearance of the skin growth.  New growths.  Any bleeding, itching, or pain in the  growths.  A skin growth that looks similar to seborrheic keratosis. Document Released: 11/29/2010 Document Revised: 01/19/2012 Document Reviewed: 11/29/2010 [facility] Patient Information 2014 [place], [place].

## 2014-03-10 DIAGNOSIS — M7701 Medial epicondylitis, right elbow: Secondary | ICD-10-CM | POA: Insufficient documentation

## 2014-03-10 NOTE — Progress Notes (Signed)
   Subjective:    Patient ID: John Sharp, male    DOB: 09/27/1961, 53 y.o.   MRN: [id number]  HPI Pt presents to the clinic with his wife to address a few concerns.   Starting to have right elbow pain. Pain is off and on but worse since he was told not to take any NSAIDs due to GERD. He does the same thing at work all day long. No known injury.   GERD- recently switched to protonix by GI. Waiting for endoscopy results.   Pt has some spots on the back of his arms that appear to be getting darker and one on his hand that is scaly. Would like to be looked at.    Review of Systems     Objective:   Physical Exam  Constitutional: He is oriented to person, place, and time. He appears well-developed and well-nourished.  HENT:  Head: Normocephalic and atraumatic.  Cardiovascular: Normal rate, regular rhythm and normal heart sounds.   Pulmonary/Chest: Effort normal and breath sounds normal. He has no wheezes.  Musculoskeletal:       Arms: Pain with palpation over medial epicondyle of right elbow. NROM of right elbow. Strength 5/5.   Neurological: He is alert and oriented to person, place, and time.  Psychiatric: He has a normal mood and affect. His behavior is normal.          Assessment & Plan:  Medial epicondylitis- placed in elbow strap today. horco can be used for acute pain. Consider injection if not improving. Pt would be another option.  Ice after activity. Wear brace 24/7 especially at work.    GERD- prontonix sent to take daily. Stop NSAIds. Follow up with GI.   Seborrheic  keratosis/actinic keratosis- discussed SK benign. AK could turn into cancer. Would like to freeze SK. Pt declines today but states he will come back. Gave HO on both.

## 2014-03-12 ENCOUNTER — Other Ambulatory Visit: Payer: Self-pay | Admitting: Physician Assistant

## 2014-03-17 ENCOUNTER — Ambulatory Visit (INDEPENDENT_AMBULATORY_CARE_PROVIDER_SITE_OTHER): Admitting: Physician Assistant

## 2014-03-17 VITALS — BP 129/81 | HR 82 | Temp 98.0°F

## 2014-03-17 DIAGNOSIS — E291 Testicular hypofunction: Secondary | ICD-10-CM

## 2014-03-17 MED ORDER — TESTOSTERONE CYPIONATE 200 MG/ML IM SOLN
200.0000 mg | Freq: Once | INTRAMUSCULAR | Status: AC
  Administered 2014-03-17: 200 mg via INTRAMUSCULAR

## 2014-03-17 NOTE — Progress Notes (Signed)
   Subjective:    Patient ID: John Sharp, male    DOB: 21-Aug-1961, 53 y.o.   MRN: [id number]  HPI Patient is here for a testosterone injection. Denies shortness of breath, headaches, chest pain or mood changes.    Review of Systems     Objective:   Physical Exam        Assessment & Plan:  Patient tolerated injection well.

## 2014-03-24 ENCOUNTER — Other Ambulatory Visit: Payer: Self-pay | Admitting: Physician Assistant

## 2014-03-24 ENCOUNTER — Encounter: Payer: Self-pay | Admitting: Physician Assistant

## 2014-03-24 MED ORDER — AMBULATORY NON FORMULARY MEDICATION: Status: AC

## 2014-03-28 ENCOUNTER — Encounter: Payer: Self-pay | Admitting: Physician Assistant

## 2014-03-31 ENCOUNTER — Ambulatory Visit (INDEPENDENT_AMBULATORY_CARE_PROVIDER_SITE_OTHER): Admitting: Physician Assistant

## 2014-03-31 ENCOUNTER — Encounter: Payer: Self-pay | Admitting: *Deleted

## 2014-03-31 VITALS — BP 133/91 | Wt 236.0 lb

## 2014-03-31 DIAGNOSIS — E291 Testicular hypofunction: Secondary | ICD-10-CM

## 2014-03-31 MED ORDER — TESTOSTERONE CYPIONATE 200 MG/ML IM SOLN
200.0000 mg | Freq: Once | INTRAMUSCULAR | Status: AC
  Administered 2014-03-31: 200 mg via INTRAMUSCULAR

## 2014-03-31 NOTE — Progress Notes (Signed)
   Subjective:    Patient ID: John Sharp, male    DOB: 01/08/1961, 53 y.o.   MRN: [id number]  HPI   Pt is here for testosterone injection Review of Systems     Objective:   Physical Exam        Assessment & Plan:  Given without complication  

## 2014-04-14 ENCOUNTER — Ambulatory Visit (INDEPENDENT_AMBULATORY_CARE_PROVIDER_SITE_OTHER): Admitting: Physician Assistant

## 2014-04-14 ENCOUNTER — Encounter: Payer: Self-pay | Admitting: *Deleted

## 2014-04-14 VITALS — BP 121/76 | HR 83 | Wt 237.0 lb

## 2014-04-14 DIAGNOSIS — E291 Testicular hypofunction: Secondary | ICD-10-CM

## 2014-04-14 MED ORDER — TESTOSTERONE CYPIONATE 200 MG/ML IM SOLN
200.0000 mg | Freq: Once | INTRAMUSCULAR | Status: AC
  Administered 2014-04-14: 200 mg via INTRAMUSCULAR

## 2014-04-14 NOTE — Progress Notes (Signed)
   Subjective:    Patient ID: John Sharp, male    DOB: December 26, 1960, 53 y.o.   MRN: [id number]  HPI   Here today for testosterone injection. Review of Systems     Objective:   Physical Exam        Assessment & Plan:  Given with out complication .

## 2014-04-21 ENCOUNTER — Telehealth: Payer: Self-pay | Admitting: *Deleted

## 2014-04-21 DIAGNOSIS — E291 Testicular hypofunction: Secondary | ICD-10-CM

## 2014-04-21 LAB — CBC
HCT: 42.2 % (ref 39.0–52.0)
HEMOGLOBIN: 14.4 g/dL (ref 13.0–17.0)
MCH: 27.6 pg (ref 26.0–34.0)
MCHC: 34.1 g/dL (ref 30.0–36.0)
MCV: 81 fL (ref 78.0–100.0)
Platelets: 227 10*3/uL (ref 150–400)
RBC: 5.21 MIL/uL (ref 4.22–5.81)
RDW: 14.1 % (ref 11.5–15.5)
WBC: 6.8 10*3/uL (ref 4.0–10.5)

## 2014-04-21 NOTE — Telephone Encounter (Signed)
Labs ordered.

## 2014-04-22 LAB — PSA, TOTAL AND FREE
PSA, Free Pct: 23 % — ABNORMAL LOW (ref >25)
PSA, Free: 0.73 ng/mL
PSA: 3.15 ng/mL (ref <=4.00)

## 2014-04-22 LAB — HEPATIC FUNCTION PANEL
ALK PHOS: 56 U/L (ref 39–117)
ALT: 23 U/L (ref 0–53)
AST: 27 U/L (ref 0–37)
Albumin: 4.2 g/dL (ref 3.5–5.2)
BILIRUBIN DIRECT: 0.1 mg/dL (ref 0.0–0.3)
BILIRUBIN TOTAL: 0.5 mg/dL (ref 0.2–1.2)
Indirect Bilirubin: 0.4 mg/dL (ref 0.2–1.2)
Total Protein: 6.5 g/dL (ref 6.0–8.3)

## 2014-04-24 LAB — TESTOSTERONE, FREE, TOTAL, SHBG
SEX HORMONE BINDING: 14 nmol/L (ref 13–71)
Testosterone, Free: 159.5 pg/mL (ref 47.0–244.0)
Testosterone-% Free: 3.1 % — ABNORMAL HIGH (ref 1.6–2.9)
Testosterone: 518 ng/dL (ref 300–890)

## 2014-04-28 ENCOUNTER — Ambulatory Visit (INDEPENDENT_AMBULATORY_CARE_PROVIDER_SITE_OTHER)

## 2014-04-28 ENCOUNTER — Encounter: Payer: Self-pay | Admitting: Sports Medicine

## 2014-04-28 ENCOUNTER — Ambulatory Visit (INDEPENDENT_AMBULATORY_CARE_PROVIDER_SITE_OTHER): Admitting: Sports Medicine

## 2014-04-28 ENCOUNTER — Other Ambulatory Visit: Payer: Self-pay | Admitting: Sports Medicine

## 2014-04-28 ENCOUNTER — Ambulatory Visit: Admitting: *Deleted

## 2014-04-28 VITALS — BP 153/108 | HR 88 | Ht 72.0 in | Wt 235.0 lb

## 2014-04-28 DIAGNOSIS — M25562 Pain in left knee: Secondary | ICD-10-CM

## 2014-04-28 DIAGNOSIS — E291 Testicular hypofunction: Secondary | ICD-10-CM

## 2014-04-28 DIAGNOSIS — M25569 Pain in unspecified knee: Secondary | ICD-10-CM

## 2014-04-28 DIAGNOSIS — M170 Bilateral primary osteoarthritis of knee: Secondary | ICD-10-CM | POA: Insufficient documentation

## 2014-04-28 DIAGNOSIS — M1710 Unilateral primary osteoarthritis, unspecified knee: Secondary | ICD-10-CM

## 2014-04-28 DIAGNOSIS — M25469 Effusion, unspecified knee: Secondary | ICD-10-CM

## 2014-04-28 MED ORDER — MELOXICAM 15 MG PO TABS: ORAL_TABLET | ORAL | Status: DC

## 2014-04-28 MED ORDER — TESTOSTERONE CYPIONATE 100 MG/ML IM SOLN
200.0000 mg | Freq: Once | INTRAMUSCULAR | Status: AC
  Administered 2014-04-28: 200 mg via INTRAMUSCULAR

## 2014-04-28 NOTE — Assessment & Plan Note (Signed)
Early osteoarthritis. 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 Continue protonic. X-rays, home rehabilitation. If no better in a month we will inject.  Return for custom orthotics

## 2014-04-28 NOTE — Progress Notes (Signed)
   Subjective:    I'm seeing this patient as a consultation for:  Iran Planas, PA-C  CC: Left knee pain  HPI: This is a pleasant 53 year old male, for the past several months he said pain he localizes anteriorly but at the joint lines. Denies any pain directly over the patellar tendon. Pain is moderate, persistent, worse with application of varus stress and flexion as in crossing his legs. No swelling, no mechanical symptoms, no trauma.  Past medical history, Surgical history, Family history not pertinant except as noted below, Social history, Allergies, and medications have been entered into the medical record, reviewed, and no changes needed.   Review of Systems: No headache, visual changes, nausea, vomiting, diarrhea, constipation, dizziness, abdominal pain, skin rash, fevers, chills, night sweats, weight loss, swollen lymph nodes, body aches, joint swelling, muscle aches, chest pain, shortness of breath, mood changes, visual or auditory hallucinations.   Objective:   General: Well Developed, well nourished, and in no acute distress.  Neuro/Psych: Alert and oriented x3, extra-ocular muscles intact, able to move all 4 extremities, sensation grossly intact. Skin: Warm and dry, no rashes noted.  Respiratory: Not using accessory muscles, speaking in full sentences, trachea midline.  Cardiovascular: Pulses palpable, no extremity edema. Abdomen: Does not appear distended. Left Knee: Normal to inspection with no erythema or effusion or obvious bony abnormalities. Minimal tenderness to palpation at the anterior medial and lateral joint lines. ROM full in flexion and extension and lower leg rotation. Ligaments with solid consistent endpoints including ACL, PCL, LCL, MCL. Negative Mcmurray's, Apley's, and Thessalonian tests. Non painful patellar compression. Patellar glide without crepitus. Patellar and quadriceps tendons unremarkable. Hamstring and quadriceps strength is normal.    Impression and Recommendations:   This case required medical decision making of moderate complexity.

## 2014-05-09 ENCOUNTER — Other Ambulatory Visit: Payer: Self-pay | Admitting: Physician Assistant

## 2014-05-09 MED ORDER — TRAMADOL HCL 50 MG PO TABS: ORAL_TABLET | ORAL | Status: DC

## 2014-05-10 ENCOUNTER — Encounter: Payer: Self-pay | Admitting: Sports Medicine

## 2014-05-10 ENCOUNTER — Ambulatory Visit (INDEPENDENT_AMBULATORY_CARE_PROVIDER_SITE_OTHER): Admitting: Sports Medicine

## 2014-05-10 VITALS — BP 123/78 | HR 89 | Wt 239.0 lb

## 2014-05-10 DIAGNOSIS — M25571 Pain in right ankle and joints of right foot: Secondary | ICD-10-CM | POA: Insufficient documentation

## 2014-05-10 DIAGNOSIS — M25579 Pain in unspecified ankle and joints of unspecified foot: Secondary | ICD-10-CM

## 2014-05-10 DIAGNOSIS — E291 Testicular hypofunction: Secondary | ICD-10-CM

## 2014-05-10 DIAGNOSIS — M25572 Pain in left ankle and joints of left foot: Secondary | ICD-10-CM

## 2014-05-10 MED ORDER — TESTOSTERONE CYPIONATE 200 MG/ML IM SOLN
200.0000 mg | INTRAMUSCULAR | Status: DC
  Administered 2014-05-10: 200 mg via INTRAMUSCULAR

## 2014-05-10 NOTE — Progress Notes (Signed)
    Patient was fitted for a : standard, cushioned, semi-rigid orthotic. The orthotic was heated and afterward the patient stood on the orthotic blank positioned on the orthotic stand. The patient was positioned in subtalar neutral position and 10 degrees of ankle dorsiflexion in a weight bearing stance. After completion of molding, a stable base was applied to the orthotic blank. The blank was ground to a stable position for weight bearing. Size: 11 Base: Blue EVA Additional Posting and Padding: None The patient ambulated these, and they were very comfortable.  I spent 40 minutes with this patient, greater than 50% was face-to-face time counseling regarding the below diagnosis.

## 2014-05-10 NOTE — Assessment & Plan Note (Signed)
Status post trimalleolar fracture with ORIF in the distant past. Bilateral custom orthotics as above. Return as needed.

## 2014-05-26 ENCOUNTER — Ambulatory Visit (INDEPENDENT_AMBULATORY_CARE_PROVIDER_SITE_OTHER): Admitting: Physician Assistant

## 2014-05-26 VITALS — BP 172/100 | HR 86 | Temp 98.3°F | Ht 72.0 in | Wt 240.0 lb

## 2014-05-26 DIAGNOSIS — E291 Testicular hypofunction: Secondary | ICD-10-CM

## 2014-05-26 MED ORDER — TESTOSTERONE CYPIONATE 200 MG/ML IM SOLN
200.0000 mg | Freq: Once | INTRAMUSCULAR | Status: AC
  Administered 2014-05-26: 200 mg via INTRAMUSCULAR

## 2014-05-26 NOTE — Progress Notes (Signed)
   Subjective:    Patient ID: John Sharp, male    DOB: [birth year], 53 y.o.   MRN: [id number]  HPI    Review of Systems     Objective:   Physical Exam        Assessment & Plan:  Pt came in for his testosterone injection and tolerated well. It was given RUOQ. Pt denies cp,palpitations, dizziness, mood changes, abnormal bleeding or ha. Pt received 200 mg (1cc) today./Janaysia Mcleroy,CMA

## 2014-06-03 ENCOUNTER — Other Ambulatory Visit: Payer: Self-pay | Admitting: Physician Assistant

## 2014-06-09 ENCOUNTER — Ambulatory Visit (INDEPENDENT_AMBULATORY_CARE_PROVIDER_SITE_OTHER): Admitting: Physician Assistant

## 2014-06-09 VITALS — BP 136/77 | HR 73 | Temp 98.7°F | Ht 72.0 in | Wt 235.0 lb

## 2014-06-09 DIAGNOSIS — E291 Testicular hypofunction: Secondary | ICD-10-CM

## 2014-06-09 MED ORDER — TESTOSTERONE CYPIONATE 200 MG/ML IM SOLN
200.0000 mg | Freq: Once | INTRAMUSCULAR | Status: AC
  Administered 2014-06-09: 200 mg via INTRAMUSCULAR

## 2014-06-09 NOTE — Progress Notes (Signed)
   Subjective:    Patient ID: John Sharp, male    DOB: 03-01-1961, 53 y.o.   MRN: [id number]  HPI    Review of Systems     Objective:   Physical Exam        Assessment & Plan:  Pt came in today for his testosterone injection and tolerated well. He received 200 mg (1cc) in the LUOQ. Denies chest pain, HA, SOB, palpitations and mood changes./ Estil Daft

## 2014-06-23 ENCOUNTER — Other Ambulatory Visit: Payer: Self-pay | Admitting: Physician Assistant

## 2014-06-23 ENCOUNTER — Ambulatory Visit (INDEPENDENT_AMBULATORY_CARE_PROVIDER_SITE_OTHER): Admitting: Physician Assistant

## 2014-06-23 VITALS — BP 131/88 | HR 85 | Ht 72.0 in | Wt 235.0 lb

## 2014-06-23 DIAGNOSIS — E291 Testicular hypofunction: Secondary | ICD-10-CM

## 2014-06-23 MED ORDER — TESTOSTERONE CYPIONATE 200 MG/ML IM SOLN
200.0000 mg | Freq: Once | INTRAMUSCULAR | Status: AC
  Administered 2014-06-23: 200 mg via INTRAMUSCULAR

## 2014-06-23 MED ORDER — TESTOSTERONE CYPIONATE 200 MG/ML IM SOLN: 200.0000 mg | Freq: Once | INTRAMUSCULAR | Status: DC

## 2014-06-23 NOTE — Progress Notes (Signed)
   Subjective:    Patient ID: John Sharp, male    DOB: 16-Jul-1961, 53 y.o.   MRN: [id number]  HPI Patient presents today testosterone injection which he received without complication on his RUOQ. Patient denies CP, SOB or mood swings. No other concerns at this time. Margette Fast, CMA    Review of Systems     Objective:   Physical Exam        Assessment & Plan:

## 2014-07-07 ENCOUNTER — Ambulatory Visit (INDEPENDENT_AMBULATORY_CARE_PROVIDER_SITE_OTHER): Admitting: Physician Assistant

## 2014-07-07 VITALS — BP 139/85 | HR 90 | Ht 71.0 in | Wt 234.0 lb

## 2014-07-07 DIAGNOSIS — E291 Testicular hypofunction: Secondary | ICD-10-CM

## 2014-07-07 MED ORDER — TESTOSTERONE CYPIONATE 200 MG/ML IM SOLN
200.0000 mg | Freq: Once | INTRAMUSCULAR | Status: AC
  Administered 2014-07-07: 200 mg via INTRAMUSCULAR

## 2014-07-07 NOTE — Progress Notes (Signed)
   Subjective:    Patient ID: John Sharp, male    DOB: 07/19/1961, 53 y.o.   MRN: [id number]  HPI Patient presents today for testosterone injection which he received in his LUOQ without complication. He denies mood swings, CP, SOB or headaches. Margette Fast, CMA    Review of Systems     Objective:   Physical Exam        Assessment & Plan:

## 2014-07-12 ENCOUNTER — Other Ambulatory Visit: Payer: Self-pay

## 2014-07-12 ENCOUNTER — Other Ambulatory Visit: Payer: Self-pay | Admitting: Physician Assistant

## 2014-07-12 MED ORDER — TRAMADOL HCL 50 MG PO TABS: ORAL_TABLET | ORAL | Status: DC

## 2014-07-13 ENCOUNTER — Other Ambulatory Visit: Payer: Self-pay

## 2014-07-13 DIAGNOSIS — M25562 Pain in left knee: Secondary | ICD-10-CM

## 2014-07-13 MED ORDER — MELOXICAM 15 MG PO TABS: ORAL_TABLET | ORAL | Status: DC

## 2014-07-13 MED ORDER — METAXALONE 800 MG PO TABS: 800.0000 mg | ORAL_TABLET | Freq: Three times a day (TID) | ORAL | Status: DC

## 2014-07-13 NOTE — Addendum Note (Signed)
Addended by: Narda Rutherford on: 07/13/2014 10:04 AM   Modules accepted: Orders

## 2014-07-21 ENCOUNTER — Other Ambulatory Visit: Payer: Self-pay | Admitting: *Deleted

## 2014-07-21 ENCOUNTER — Other Ambulatory Visit: Payer: Self-pay | Admitting: Physician Assistant

## 2014-07-21 ENCOUNTER — Ambulatory Visit (INDEPENDENT_AMBULATORY_CARE_PROVIDER_SITE_OTHER): Admitting: Physician Assistant

## 2014-07-21 VITALS — BP 121/74 | HR 86 | Wt 229.0 lb

## 2014-07-21 DIAGNOSIS — Z23 Encounter for immunization: Secondary | ICD-10-CM

## 2014-07-21 DIAGNOSIS — E291 Testicular hypofunction: Secondary | ICD-10-CM

## 2014-07-21 MED ORDER — PANTOPRAZOLE SODIUM 40 MG PO TBEC: 40.0000 mg | DELAYED_RELEASE_TABLET | Freq: Two times a day (BID) | ORAL | Status: DC

## 2014-07-21 MED ORDER — TESTOSTERONE CYPIONATE 200 MG/ML IM SOLN
200.0000 mg | Freq: Once | INTRAMUSCULAR | Status: AC
  Administered 2014-07-21: 200 mg via INTRAMUSCULAR

## 2014-07-21 MED ORDER — ALBUTEROL SULFATE HFA 108 (90 BASE) MCG/ACT IN AERS: 2.0000 | INHALATION_SPRAY | Freq: Four times a day (QID) | RESPIRATORY_TRACT | Status: DC | PRN

## 2014-07-21 NOTE — Addendum Note (Signed)
Addended by: Beatris Ship L on: 07/21/2014 12:06 PM   Modules accepted: Orders

## 2014-07-21 NOTE — Progress Notes (Signed)
   Subjective:    Patient ID: John Sharp, male    DOB: 06-03-1961, 53 y.o.   MRN: [id number]  HPI  Antero is here for testosterone injection. Denies chest pain, shortness of breath, headaches or mood changes.   GERD - [facility]/Dr. Harl Bowie has increased his Protonix/pantoprazole 40 mg tablets to bid.    Review of Systems     Objective:   Physical Exam        Assessment & Plan:  Patient tolerated injection well without any complications. Patient advised to schedule his next appointment for 2 weeks.   GERD- refilled protonix.   Flu shot given without complication.

## 2014-08-04 ENCOUNTER — Other Ambulatory Visit: Payer: Self-pay | Admitting: Physician Assistant

## 2014-08-04 ENCOUNTER — Ambulatory Visit (INDEPENDENT_AMBULATORY_CARE_PROVIDER_SITE_OTHER): Admitting: Physician Assistant

## 2014-08-04 VITALS — BP 127/82 | HR 73 | Resp 16 | Wt 240.0 lb

## 2014-08-04 DIAGNOSIS — E291 Testicular hypofunction: Secondary | ICD-10-CM

## 2014-08-04 MED ORDER — TESTOSTERONE CYPIONATE 200 MG/ML IM SOLN: 200.0000 mg | Freq: Once | INTRAMUSCULAR | Status: DC

## 2014-08-04 NOTE — Progress Notes (Signed)
   Subjective:    Patient ID: John Sharp, male    DOB: 30-Aug-1961, 53 y.o.   MRN: [id number]  HPI Patient here for a testosterone injection. Denies chest pain, shortness of breath, headaches or mood changes. P.Pennye Beeghly, RN  Review of Systems     Objective:   Physical Exam        Assessment & Plan:  Patient tolerated injection well without comlications. Patient advised to schedule his next appointment in 2 weeks from today.

## 2014-08-18 ENCOUNTER — Ambulatory Visit (INDEPENDENT_AMBULATORY_CARE_PROVIDER_SITE_OTHER): Admitting: Sports Medicine

## 2014-08-18 VITALS — BP 128/80 | HR 96 | Wt 243.0 lb

## 2014-08-18 DIAGNOSIS — E291 Testicular hypofunction: Secondary | ICD-10-CM

## 2014-08-18 MED ORDER — TESTOSTERONE CYPIONATE 200 MG/ML IM SOLN
200.0000 mg | Freq: Once | INTRAMUSCULAR | Status: AC
  Administered 2014-08-18: 200 mg via INTRAMUSCULAR

## 2014-08-18 NOTE — Progress Notes (Signed)
   Subjective:    Patient ID: John Sharp, male    DOB: 11/03/1961, 53 y.o.   MRN: [id number]  HPI  John Sharp is here for a testosterone injection. Denies chest pain, shortness of breath, headaches or mood changes.   Review of Systems     Objective:   Physical Exam        Assessment & Plan:  Patient tolerated injection well without complications. Patient advised to schedule next injection 14 days from today.  

## 2014-08-18 NOTE — Assessment & Plan Note (Signed)
Testosterone injection as above. 

## 2014-08-29 ENCOUNTER — Telehealth: Payer: Self-pay | Admitting: *Deleted

## 2014-08-29 NOTE — Telephone Encounter (Signed)
Every 6 months. Will not need until December.

## 2014-08-29 NOTE — Telephone Encounter (Signed)
Pt's wife stopped by my desk yesterday asking if it was time to recheck his testosterone & PSA.  These were last done in June & looked ok.  How often are you wanting them checked? Please advise.

## 2014-08-29 NOTE — Telephone Encounter (Signed)
Pt's wife notified.

## 2014-09-01 ENCOUNTER — Ambulatory Visit (INDEPENDENT_AMBULATORY_CARE_PROVIDER_SITE_OTHER): Admitting: Physician Assistant

## 2014-09-01 VITALS — BP 134/88 | HR 67 | Temp 97.7°F | Ht 72.0 in | Wt 246.0 lb

## 2014-09-01 DIAGNOSIS — E291 Testicular hypofunction: Secondary | ICD-10-CM

## 2014-09-01 MED ORDER — TESTOSTERONE CYPIONATE 200 MG/ML IM SOLN
200.0000 mg | Freq: Once | INTRAMUSCULAR | Status: AC
  Administered 2014-09-01: 200 mg via INTRAMUSCULAR

## 2014-09-01 NOTE — Progress Notes (Signed)
   Subjective:    Patient ID: John Sharp, male    DOB: [birth year], 53 y.o.   MRN: [id number]  HPI Trejuan presents today for scheduled testosterone injection which he rec'd without complication. He denies recent illness, fever, CP, SOB or headache. Margette Fast, CMA    Review of Systems     Objective:   Physical Exam        Assessment & Plan:  RTC for scheduled testosterone injection in 2 weeks,

## 2014-09-13 ENCOUNTER — Other Ambulatory Visit: Payer: Self-pay | Admitting: Physician Assistant

## 2014-09-15 ENCOUNTER — Ambulatory Visit (INDEPENDENT_AMBULATORY_CARE_PROVIDER_SITE_OTHER): Admitting: Family Medicine

## 2014-09-15 VITALS — BP 149/88 | HR 91 | Ht 72.0 in | Wt 242.0 lb

## 2014-09-15 DIAGNOSIS — E291 Testicular hypofunction: Secondary | ICD-10-CM

## 2014-09-15 MED ORDER — TESTOSTERONE CYPIONATE 200 MG/ML IM SOLN
200.0000 mg | Freq: Once | INTRAMUSCULAR | Status: AC
  Administered 2014-09-15: 200 mg via INTRAMUSCULAR

## 2014-09-15 NOTE — Progress Notes (Signed)
   Subjective:    Patient ID: John Sharp, male    DOB: 11-19-1960, 53 y.o.   MRN: [id number]  HPI  John Sharp reported to office today for his scheduled testosterone injection which he rec'd in his RUOQ without complication. He denies CP, headache or SOB.    Review of Systems     Objective:   Physical Exam        Assessment & Plan:

## 2014-09-18 ENCOUNTER — Other Ambulatory Visit: Payer: Self-pay | Admitting: Physician Assistant

## 2014-09-29 ENCOUNTER — Ambulatory Visit (INDEPENDENT_AMBULATORY_CARE_PROVIDER_SITE_OTHER): Admitting: Physician Assistant

## 2014-09-29 VITALS — BP 130/89 | HR 108 | Wt 230.0 lb

## 2014-09-29 DIAGNOSIS — E291 Testicular hypofunction: Secondary | ICD-10-CM

## 2014-09-29 MED ORDER — TESTOSTERONE CYPIONATE 100 MG/ML IM SOLN
200.0000 mg | Freq: Once | INTRAMUSCULAR | Status: AC
  Administered 2014-09-29: 200 mg via INTRAMUSCULAR

## 2014-09-29 NOTE — Progress Notes (Signed)
   Subjective:    Patient ID: John Sharp, male    DOB: 1961-11-03, 53 y.o.   MRN: [id number]  HPI  John Sharp presents today for testosterone injection which he receives due to testicular hypofunction. He denies CP, SOB or mood swings and has no other GU concerns at this time.     Review of Systems     Objective:   Physical Exam        Assessment & Plan:

## 2014-10-08 ENCOUNTER — Telehealth: Payer: Self-pay | Admitting: Physician Assistant

## 2014-10-08 NOTE — Telephone Encounter (Signed)
Pt called over weekend. Recent back sprain with worsening pain please print norco 5/325 1 tablet every 12 hours as needed for a pain #20. And flexeril 10mg  at bedtime #20. Encourage ice and heat alternation.

## 2014-10-09 ENCOUNTER — Other Ambulatory Visit: Payer: Self-pay | Admitting: *Deleted

## 2014-10-09 MED ORDER — HYDROCODONE-ACETAMINOPHEN 5-325 MG PO TABS: 1.0000 | ORAL_TABLET | Freq: Two times a day (BID) | ORAL | Status: DC | PRN

## 2014-10-09 MED ORDER — CYCLOBENZAPRINE HCL 10 MG PO TABS: 10.0000 mg | ORAL_TABLET | Freq: Every evening | ORAL | Status: DC | PRN

## 2014-10-09 NOTE — Telephone Encounter (Signed)
Rx printed

## 2014-10-10 ENCOUNTER — Other Ambulatory Visit: Payer: Self-pay | Admitting: Physician Assistant

## 2014-10-13 ENCOUNTER — Ambulatory Visit (INDEPENDENT_AMBULATORY_CARE_PROVIDER_SITE_OTHER): Admitting: Physician Assistant

## 2014-10-13 VITALS — BP 132/81 | HR 83 | Wt 230.0 lb

## 2014-10-13 DIAGNOSIS — E291 Testicular hypofunction: Secondary | ICD-10-CM

## 2014-10-13 MED ORDER — TESTOSTERONE CYPIONATE 200 MG/ML IM SOLN
200.0000 mg | Freq: Once | INTRAMUSCULAR | Status: AC
  Administered 2014-10-13: 200 mg via INTRAMUSCULAR

## 2014-10-13 NOTE — Progress Notes (Signed)
   Subjective:    Patient ID: John Sharp, male    DOB: 02/23/1961, 53 y.o.   MRN: [id number]  HPI  John Sharp is here for a testosterone injection. Denies chest pain, shortness of breath, headaches or mood changes.   Review of Systems     Objective:   Physical Exam        Assessment & Plan:  Patient tolerated injection well without complications. Patient advised to schedule next injection 14 days from today.  

## 2014-10-15 ENCOUNTER — Other Ambulatory Visit: Payer: Self-pay | Admitting: Physician Assistant

## 2014-10-27 ENCOUNTER — Other Ambulatory Visit: Payer: Self-pay | Admitting: Physician Assistant

## 2014-10-27 ENCOUNTER — Ambulatory Visit (INDEPENDENT_AMBULATORY_CARE_PROVIDER_SITE_OTHER): Admitting: Physician Assistant

## 2014-10-27 VITALS — BP 128/81 | HR 77 | Wt 230.0 lb

## 2014-10-27 DIAGNOSIS — E291 Testicular hypofunction: Secondary | ICD-10-CM

## 2014-10-27 MED ORDER — TESTOSTERONE CYPIONATE 200 MG/ML IM SOLN
200.0000 mg | Freq: Once | INTRAMUSCULAR | Status: AC
  Administered 2014-10-27: 200 mg via INTRAMUSCULAR

## 2014-10-27 NOTE — Progress Notes (Signed)
   Subjective:    Patient ID: John Sharp, male    DOB: 09/25/1961, 53 y.o.   MRN: [id number]  HPI  John Sharp is here for a testosterone injection. Denies chest pain, shortness of breath, headaches or mood changes.   Review of Systems     Objective:   Physical Exam        Assessment & Plan:  Patient tolerated injection well without complications. Patient advised to schedule next injection 14 days from today.

## 2014-11-06 ENCOUNTER — Other Ambulatory Visit: Payer: Self-pay | Admitting: *Deleted

## 2014-11-06 ENCOUNTER — Other Ambulatory Visit: Payer: Self-pay | Admitting: Physician Assistant

## 2014-11-08 ENCOUNTER — Ambulatory Visit (INDEPENDENT_AMBULATORY_CARE_PROVIDER_SITE_OTHER): Admitting: Physician Assistant

## 2014-11-08 ENCOUNTER — Other Ambulatory Visit: Payer: Self-pay | Admitting: Physician Assistant

## 2014-11-08 ENCOUNTER — Ambulatory Visit

## 2014-11-08 VITALS — BP 140/80 | HR 80 | Ht 72.0 in | Wt 230.0 lb

## 2014-11-08 DIAGNOSIS — E291 Testicular hypofunction: Secondary | ICD-10-CM

## 2014-11-08 MED ORDER — TESTOSTERONE CYPIONATE 100 MG/ML IM SOLN: 200.0000 mg | Freq: Once | INTRAMUSCULAR | Status: DC

## 2014-11-08 MED ORDER — TRAMADOL HCL 50 MG PO TABS: 50.0000 mg | ORAL_TABLET | Freq: Three times a day (TID) | ORAL | Status: DC | PRN

## 2014-11-08 MED ORDER — TESTOSTERONE CYPIONATE 200 MG/ML IM SOLN
200.0000 mg | Freq: Once | INTRAMUSCULAR | Status: AC
  Administered 2014-11-08: 200 mg via INTRAMUSCULAR

## 2014-11-08 NOTE — Progress Notes (Signed)
   Subjective:    Patient ID: John Sharp, male    DOB: 01-Jun-1961, 53 y.o.   MRN: [id number]  HPI  Quinto came to office today for scheduled testosterone injection which he received without complication. He denies CP, SOB or mood swings. No other GU concerns at this time.    Review of Systems     Objective:   Physical Exam        Assessment & Plan:  RTC for next scheduled injection.

## 2014-11-09 ENCOUNTER — Ambulatory Visit

## 2014-11-13 ENCOUNTER — Other Ambulatory Visit: Payer: Self-pay | Admitting: *Deleted

## 2014-11-13 MED ORDER — PANTOPRAZOLE SODIUM 40 MG PO TBEC: DELAYED_RELEASE_TABLET | ORAL | Status: DC

## 2014-11-24 ENCOUNTER — Ambulatory Visit (INDEPENDENT_AMBULATORY_CARE_PROVIDER_SITE_OTHER): Admitting: Physician Assistant

## 2014-11-24 VITALS — BP 125/77 | HR 74 | Wt 230.0 lb

## 2014-11-24 DIAGNOSIS — E291 Testicular hypofunction: Secondary | ICD-10-CM

## 2014-11-24 MED ORDER — TESTOSTERONE CYPIONATE 200 MG/ML IM SOLN
100.0000 mg | Freq: Once | INTRAMUSCULAR | Status: AC
  Administered 2014-11-24: 100 mg via INTRAMUSCULAR

## 2014-11-24 NOTE — Progress Notes (Signed)
   Subjective:    Patient ID: John Sharp, male    DOB: [birth year], 54 y.o.   MRN: [id number]  HPI  John Sharp reports to office for testosterone injection which he received without complication. He denies CP, SOB or mood swings.    Review of Systems  All other systems reviewed and are negative.      Objective:   Physical Exam        Assessment & Plan:  Hypogonadism- testosterone given without complications. Needs 6 month follow up scheduled. Iran Planas PA-C

## 2014-12-03 ENCOUNTER — Other Ambulatory Visit: Payer: Self-pay | Admitting: Physician Assistant

## 2014-12-08 ENCOUNTER — Ambulatory Visit (INDEPENDENT_AMBULATORY_CARE_PROVIDER_SITE_OTHER): Admitting: Physician Assistant

## 2014-12-08 VITALS — BP 138/90 | HR 84

## 2014-12-08 DIAGNOSIS — E291 Testicular hypofunction: Secondary | ICD-10-CM

## 2014-12-08 MED ORDER — TESTOSTERONE CYPIONATE 200 MG/ML IM SOLN
200.0000 mg | Freq: Once | INTRAMUSCULAR | Status: AC
  Administered 2014-12-08: 200 mg via INTRAMUSCULAR

## 2014-12-08 NOTE — Progress Notes (Signed)
   Subjective:    Patient ID: John Sharp, male    DOB: 1960-12-27, 54 y.o.   MRN: [id number]  HPI   Pt is here for a testosterone injection  Review of Systems     Objective:   Physical Exam        Assessment & Plan:  Pt tolerated injection well. Follow up in 2 weeks for next infection. Iran Planas PA-C

## 2014-12-22 ENCOUNTER — Ambulatory Visit (INDEPENDENT_AMBULATORY_CARE_PROVIDER_SITE_OTHER): Admitting: Physician Assistant

## 2014-12-22 ENCOUNTER — Ambulatory Visit: Admitting: Physician Assistant

## 2014-12-22 ENCOUNTER — Telehealth: Payer: Self-pay | Admitting: *Deleted

## 2014-12-22 VITALS — BP 127/95 | Resp 90 | Wt 242.0 lb

## 2014-12-22 DIAGNOSIS — Z79899 Other long term (current) drug therapy: Secondary | ICD-10-CM

## 2014-12-22 DIAGNOSIS — E291 Testicular hypofunction: Secondary | ICD-10-CM

## 2014-12-22 DIAGNOSIS — Z13220 Encounter for screening for lipoid disorders: Secondary | ICD-10-CM

## 2014-12-22 MED ORDER — TESTOSTERONE CYPIONATE 200 MG/ML IM SOLN
200.0000 mg | Freq: Once | INTRAMUSCULAR | Status: AC
  Administered 2014-12-22: 200 mg via INTRAMUSCULAR

## 2014-12-22 NOTE — Progress Notes (Signed)
   Subjective:    Patient ID: John Sharp, male    DOB: [birth year], 54 y.o.   MRN: [id number]  HPI  Patient is here for a Testerone injection.    Review of Systems     Objective:   Physical Exam        Assessment & Plan:  Patient tolerated injection well without complications. Right Side

## 2014-12-22 NOTE — Telephone Encounter (Signed)
Labs ordered & faxed to [facility].

## 2014-12-30 LAB — CBC WITH DIFFERENTIAL/PLATELET
Basophils Absolute: 0 10*3/uL (ref 0.0–0.1)
Basophils Relative: 0 % (ref 0–1)
Eosinophils Absolute: 0.1 10*3/uL (ref 0.0–0.7)
Eosinophils Relative: 1 % (ref 0–5)
HCT: 46.7 % (ref 39.0–52.0)
Hemoglobin: 16 g/dL (ref 13.0–17.0)
Lymphocytes Relative: 29 % (ref 12–46)
Lymphs Abs: 2 10*3/uL (ref 0.7–4.0)
MCH: 30 pg (ref 26.0–34.0)
MCHC: 34.3 g/dL (ref 30.0–36.0)
MCV: 87.6 fL (ref 78.0–100.0)
MONO ABS: 0.6 10*3/uL (ref 0.1–1.0)
MONOS PCT: 9 % (ref 3–12)
MPV: 10.7 fL (ref 8.6–12.4)
Neutro Abs: 4.2 10*3/uL (ref 1.7–7.7)
Neutrophils Relative %: 61 % (ref 43–77)
Platelets: 222 10*3/uL (ref 150–400)
RBC: 5.33 MIL/uL (ref 4.22–5.81)
RDW: 13.5 % (ref 11.5–15.5)
WBC: 6.9 10*3/uL (ref 4.0–10.5)

## 2014-12-30 LAB — LIPID PANEL
CHOL/HDL RATIO: 4 ratio
CHOLESTEROL: 188 mg/dL (ref 0–200)
HDL: 47 mg/dL (ref >39)
LDL Cholesterol: 115 mg/dL — ABNORMAL HIGH (ref 0–99)
Triglycerides: 129 mg/dL (ref <150)
VLDL: 26 mg/dL (ref 0–40)

## 2014-12-30 LAB — COMPREHENSIVE METABOLIC PANEL
ALK PHOS: 57 U/L (ref 39–117)
ALT: 29 U/L (ref 0–53)
AST: 28 U/L (ref 0–37)
Albumin: 4.3 g/dL (ref 3.5–5.2)
BUN: 22 mg/dL (ref 6–23)
CALCIUM: 9.7 mg/dL (ref 8.4–10.5)
CO2: 30 mEq/L (ref 19–32)
Chloride: 102 mEq/L (ref 96–112)
Creat: 1.01 mg/dL (ref 0.50–1.35)
Glucose, Bld: 86 mg/dL (ref 70–99)
Potassium: 4.6 mEq/L (ref 3.5–5.3)
Sodium: 141 mEq/L (ref 135–145)
Total Bilirubin: 0.9 mg/dL (ref 0.2–1.2)
Total Protein: 6.9 g/dL (ref 6.0–8.3)

## 2014-12-30 LAB — PSA, TOTAL AND FREE
PSA FREE PCT: 18 % — AB (ref >25)
PSA, Free: 0.87 ng/mL
PSA: 4.81 ng/mL — ABNORMAL HIGH (ref <=4.00)

## 2015-01-01 ENCOUNTER — Other Ambulatory Visit: Payer: Self-pay | Admitting: Emergency Medicine

## 2015-01-01 LAB — TESTOSTERONE, FREE, TOTAL, SHBG
Sex Hormone Binding: 11 nmol/L (ref 10–50)
TESTOSTERONE FREE: 367.2 pg/mL — AB (ref 47.0–244.0)
Testosterone-% Free: 3.5 % — ABNORMAL HIGH (ref 1.6–2.9)
Testosterone: 1039 ng/dL — ABNORMAL HIGH (ref 300–890)

## 2015-01-01 NOTE — Addendum Note (Signed)
Addended by: Narda Rutherford on: 01/01/2015 01:51 PM   Modules accepted: Orders, Medications

## 2015-01-02 ENCOUNTER — Other Ambulatory Visit: Payer: Self-pay | Admitting: Physician Assistant

## 2015-01-05 ENCOUNTER — Ambulatory Visit (INDEPENDENT_AMBULATORY_CARE_PROVIDER_SITE_OTHER): Admitting: Physician Assistant

## 2015-01-05 ENCOUNTER — Encounter: Payer: Self-pay | Admitting: Physician Assistant

## 2015-01-05 VITALS — BP 130/85 | HR 90 | Wt 220.0 lb

## 2015-01-05 DIAGNOSIS — R972 Elevated prostate specific antigen [PSA]: Secondary | ICD-10-CM | POA: Diagnosis not present

## 2015-01-05 DIAGNOSIS — K2100 Gastro-esophageal reflux disease with esophagitis, without bleeding: Secondary | ICD-10-CM

## 2015-01-05 DIAGNOSIS — R6889 Other general symptoms and signs: Secondary | ICD-10-CM | POA: Diagnosis not present

## 2015-01-05 DIAGNOSIS — E291 Testicular hypofunction: Secondary | ICD-10-CM

## 2015-01-05 DIAGNOSIS — R899 Unspecified abnormal finding in specimens from other organs, systems and tissues: Secondary | ICD-10-CM

## 2015-01-05 MED ORDER — TESTOSTERONE CYPIONATE 200 MG/ML IM SOLN
150.0000 mg | Freq: Once | INTRAMUSCULAR | Status: AC
  Administered 2015-01-05: 150 mg via INTRAMUSCULAR

## 2015-01-05 MED ORDER — MONTELUKAST SODIUM 10 MG PO TABS: 10.0000 mg | ORAL_TABLET | Freq: Every day | ORAL | Status: DC

## 2015-01-05 MED ORDER — PANTOPRAZOLE SODIUM 40 MG PO TBEC: DELAYED_RELEASE_TABLET | ORAL | Status: DC

## 2015-01-05 NOTE — Progress Notes (Signed)
   Subjective:    Patient ID: John Sharp, male    DOB: Oct 27, 1961, 54 y.o.   MRN: [id number]  HPI Pt presents to the clinic for 6 month follow up and lab review.   No problems or concerns today.   GERD- doing well just needs refill. Has appt with Dr. Gordy Levan this month at [facility].    Review of Systems  All other systems reviewed and are negative.      Objective:   Physical Exam  Constitutional: He is oriented to person, place, and time. He appears well-developed and well-nourished.  HENT:  Head: Normocephalic and atraumatic.  Cardiovascular: Normal rate, regular rhythm and normal heart sounds.   Pulmonary/Chest: Effort normal and breath sounds normal.  Neurological: He is alert and oriented to person, place, and time.  Skin: Skin is dry.  Psychiatric: He has a normal mood and affect. His behavior is normal.          Assessment & Plan:  Elevated PSA/elevated testosterone- AUA is 7. Pt does not feel like his symptoms are concerning mostly just urination at night. I do think could be related to elevated testosterone. Will recheck in 5 weeks after decreasing dose of testosterone. If not improving will refer to urology for work up.    Male hypogonadism- decreased today to 150mg  IM. No complications. Follow up in 2 weeks next injection. Discussed could be due to patients weight loss that he does not need same testosterone dose.     [facility]- [facility]

## 2015-01-05 NOTE — Patient Instructions (Signed)
Recheck testosterone and PSA in 4-6 weeks.

## 2015-01-08 DIAGNOSIS — R972 Elevated prostate specific antigen [PSA]: Secondary | ICD-10-CM | POA: Insufficient documentation

## 2015-01-08 DIAGNOSIS — R899 Unspecified abnormal finding in specimens from other organs, systems and tissues: Secondary | ICD-10-CM | POA: Insufficient documentation

## 2015-01-19 ENCOUNTER — Ambulatory Visit (INDEPENDENT_AMBULATORY_CARE_PROVIDER_SITE_OTHER): Admitting: Physician Assistant

## 2015-01-19 VITALS — BP 117/79 | HR 80 | Resp 16 | Wt 220.0 lb

## 2015-01-19 DIAGNOSIS — E291 Testicular hypofunction: Secondary | ICD-10-CM | POA: Diagnosis not present

## 2015-01-19 MED ORDER — TESTOSTERONE CYPIONATE 200 MG/ML IM SOLN
150.0000 mg | INTRAMUSCULAR | Status: DC
  Administered 2015-01-19: 150 mg via INTRAMUSCULAR

## 2015-01-19 NOTE — Progress Notes (Signed)
   Subjective:    Patient ID: John Sharp, male    DOB: [birth year], 54 y.o.   MRN: [id number]  HPI Patient is here today for testosterone injection. He denies chest pain, shortness of breath, headaches and mood changes.  Review of Systems     Objective:   Physical Exam        Assessment & Plan:  Patient tolerated injection well without complications. He will make appointment for next injection 2 weeks from today; reminded him to have PSA drawn at next visit or the following one.

## 2015-01-31 ENCOUNTER — Other Ambulatory Visit: Payer: Self-pay | Admitting: *Deleted

## 2015-01-31 MED ORDER — TRAMADOL HCL 50 MG PO TABS: 50.0000 mg | ORAL_TABLET | Freq: Three times a day (TID) | ORAL | Status: DC | PRN

## 2015-02-01 ENCOUNTER — Ambulatory Visit (INDEPENDENT_AMBULATORY_CARE_PROVIDER_SITE_OTHER): Admitting: Family Medicine

## 2015-02-01 VITALS — BP 108/70 | HR 81 | Ht 72.0 in | Wt 227.0 lb

## 2015-02-01 DIAGNOSIS — E291 Testicular hypofunction: Secondary | ICD-10-CM

## 2015-02-01 MED ORDER — TESTOSTERONE CYPIONATE 200 MG/ML IM SOLN
150.0000 mg | Freq: Once | INTRAMUSCULAR | Status: AC
  Administered 2015-02-01: 150 mg via INTRAMUSCULAR

## 2015-02-01 NOTE — Progress Notes (Signed)
   Subjective:    Patient ID: John Sharp, male    DOB: [birth year], 54 y.o.   MRN: [id number]  HPI Hypogonadism-here for testosterone injection.   Review of Systems     Objective:   Physical Exam        Assessment & Plan:  Beatrice Lecher, MD

## 2015-02-05 ENCOUNTER — Ambulatory Visit

## 2015-02-14 ENCOUNTER — Other Ambulatory Visit: Payer: Self-pay | Admitting: *Deleted

## 2015-02-14 ENCOUNTER — Telehealth: Payer: Self-pay | Admitting: *Deleted

## 2015-02-14 MED ORDER — TRAMADOL HCL 50 MG PO TABS: 50.0000 mg | ORAL_TABLET | Freq: Three times a day (TID) | ORAL | Status: DC | PRN

## 2015-02-14 NOTE — Telephone Encounter (Signed)
Ok for tramadol refill. If taking fairly daily for overall pain there might be something we could do to help treat pain. Find out what causes most of his pain. Perhaps appt with Dr. Darene Lamer is needed.

## 2015-02-14 NOTE — Telephone Encounter (Signed)
Wife -Dianah Field called in request for Tramadol for [employer]

## 2015-02-16 ENCOUNTER — Ambulatory Visit (INDEPENDENT_AMBULATORY_CARE_PROVIDER_SITE_OTHER): Admitting: Physician Assistant

## 2015-02-16 VITALS — BP 144/91 | HR 96 | Wt 220.0 lb

## 2015-02-16 DIAGNOSIS — E291 Testicular hypofunction: Secondary | ICD-10-CM | POA: Diagnosis not present

## 2015-02-16 MED ORDER — TESTOSTERONE CYPIONATE 200 MG/ML IM SOLN
150.0000 mg | Freq: Once | INTRAMUSCULAR | Status: AC
  Administered 2015-02-16: 150 mg via INTRAMUSCULAR

## 2015-02-16 NOTE — Progress Notes (Signed)
   Subjective:    Patient ID: John Sharp, male    DOB: 12-Sep-1961, 54 y.o.   MRN: [id number]  HPI Patient came into office today for testosterone injection. Denies chest pain, shortness of breath, headaches and problems associated with taking this medication. Patient states he has had no abnornal mood swings.    Review of Systems     Objective:   Physical Exam        Assessment & Plan:  Patient tolerated injection in [place] well without complications. Patient advised to schedule his next injection for 2 weeks from today.

## 2015-03-02 ENCOUNTER — Ambulatory Visit (INDEPENDENT_AMBULATORY_CARE_PROVIDER_SITE_OTHER): Admitting: Physician Assistant

## 2015-03-02 VITALS — BP 133/94 | HR 72 | Wt 220.0 lb

## 2015-03-02 DIAGNOSIS — E291 Testicular hypofunction: Secondary | ICD-10-CM

## 2015-03-02 MED ORDER — TESTOSTERONE CYPIONATE 200 MG/ML IM SOLN
150.0000 mg | Freq: Once | INTRAMUSCULAR | Status: AC
  Administered 2015-03-02: 150 mg via INTRAMUSCULAR

## 2015-03-02 NOTE — Progress Notes (Signed)
   Subjective:    Patient ID: John Sharp, male    DOB: 08-10-1961, 54 y.o.   MRN: [id number]  HPI Patient came into office today for testosterone injection. Denies chest pain, shortness of breath, headaches and problems associated with taking this medication. Patient states he has had no abnornal mood swings.    Review of Systems     Objective:   Physical Exam        Assessment & Plan:  Patient tolerated injection well in [place] without complications. Patient advised to schedule his next injection for 2 weeks from today.

## 2015-03-06 ENCOUNTER — Other Ambulatory Visit: Payer: Self-pay | Admitting: Physician Assistant

## 2015-03-08 MED ORDER — ALBUTEROL SULFATE HFA 108 (90 BASE) MCG/ACT IN AERS: 2.0000 | INHALATION_SPRAY | Freq: Four times a day (QID) | RESPIRATORY_TRACT | Status: DC | PRN

## 2015-03-15 ENCOUNTER — Other Ambulatory Visit: Payer: Self-pay | Admitting: Physician Assistant

## 2015-03-16 ENCOUNTER — Ambulatory Visit (INDEPENDENT_AMBULATORY_CARE_PROVIDER_SITE_OTHER): Admitting: Physician Assistant

## 2015-03-16 VITALS — BP 136/85 | HR 86

## 2015-03-16 DIAGNOSIS — E291 Testicular hypofunction: Secondary | ICD-10-CM | POA: Diagnosis not present

## 2015-03-16 MED ORDER — TESTOSTERONE CYPIONATE 200 MG/ML IM SOLN
150.0000 mg | Freq: Once | INTRAMUSCULAR | Status: AC
  Administered 2015-03-16: 150 mg via INTRAMUSCULAR

## 2015-03-16 NOTE — Progress Notes (Signed)
   Subjective:    Patient ID: John Sharp, male    DOB: 1961-06-01, 54 y.o.   MRN: [id number]  HPI Patient came into office today for testosterone injection. Denies chest pain, shortness of breath, headaches and problems associated with taking this medication. Patient states he has had no abnornal mood swings.    Review of Systems     Objective:   Physical Exam        Assessment & Plan:  Patient tolerated injection in [place] well without complications. Patient advised to schedule his next injection for 2 weeks from today.

## 2015-03-26 ENCOUNTER — Other Ambulatory Visit: Payer: Self-pay | Admitting: Physician Assistant

## 2015-03-27 ENCOUNTER — Other Ambulatory Visit: Payer: Self-pay | Admitting: Physician Assistant

## 2015-03-27 ENCOUNTER — Other Ambulatory Visit: Payer: Self-pay | Admitting: Family Medicine

## 2015-03-27 NOTE — Telephone Encounter (Signed)
Verbalized that pharmacy received last Rx for Tramadol.

## 2015-03-27 NOTE — Telephone Encounter (Signed)
Rx declined, spoke with Pt and he reports no longer taking Prozac.

## 2015-03-29 ENCOUNTER — Ambulatory Visit (INDEPENDENT_AMBULATORY_CARE_PROVIDER_SITE_OTHER): Admitting: Family Medicine

## 2015-03-29 VITALS — BP 128/77 | HR 79 | Ht 72.0 in | Wt 236.0 lb

## 2015-03-29 DIAGNOSIS — E291 Testicular hypofunction: Secondary | ICD-10-CM | POA: Diagnosis not present

## 2015-03-29 MED ORDER — TESTOSTERONE CYPIONATE 200 MG/ML IM SOLN
200.0000 mg | Freq: Once | INTRAMUSCULAR | Status: AC
  Administered 2015-03-29: 200 mg via INTRAMUSCULAR

## 2015-03-29 NOTE — Progress Notes (Signed)
   Subjective:    Patient ID: John Sharp, male    DOB: 1961/01/15, 54 y.o.   MRN: [id number] Pt here for testosterone injection.  Given RUOQ with no complications. Beatris Ship, CMA HPI    Review of Systems     Objective:   Physical Exam        Assessment & Plan:

## 2015-03-30 ENCOUNTER — Ambulatory Visit

## 2015-04-13 ENCOUNTER — Ambulatory Visit (INDEPENDENT_AMBULATORY_CARE_PROVIDER_SITE_OTHER): Admitting: Family Medicine

## 2015-04-13 VITALS — BP 118/76 | HR 79 | Wt 234.0 lb

## 2015-04-13 DIAGNOSIS — E291 Testicular hypofunction: Secondary | ICD-10-CM

## 2015-04-13 MED ORDER — TESTOSTERONE CYPIONATE 200 MG/ML IM SOLN
150.0000 mg | Freq: Once | INTRAMUSCULAR | Status: AC
  Administered 2015-04-13: 150 mg via INTRAMUSCULAR

## 2015-04-13 NOTE — Progress Notes (Signed)
   Subjective:    Patient ID: John Sharp, male    DOB: 11-29-1960, 54 y.o.   MRN: [id number]  HPI  Patient came into office today for testosterone injection. Denies chest pain, shortness of breath, headaches and problems associated with taking this medication. Patient states he has had no abnornal mood swings.   Review of Systems     Objective:   Physical Exam        Assessment & Plan:  Patient tolerated injection in [place] well without complications. Patient advised to schedule his next injection for 2 weeks from today.

## 2015-04-27 ENCOUNTER — Ambulatory Visit (INDEPENDENT_AMBULATORY_CARE_PROVIDER_SITE_OTHER): Admitting: Family Medicine

## 2015-04-27 ENCOUNTER — Encounter: Payer: Self-pay | Admitting: Family Medicine

## 2015-04-27 VITALS — BP 116/76 | HR 83 | Ht 72.0 in | Wt 245.0 lb

## 2015-04-27 DIAGNOSIS — T148 Other injury of unspecified body region: Secondary | ICD-10-CM | POA: Diagnosis not present

## 2015-04-27 DIAGNOSIS — L570 Actinic keratosis: Secondary | ICD-10-CM

## 2015-04-27 DIAGNOSIS — W57XXXA Bitten or stung by nonvenomous insect and other nonvenomous arthropods, initial encounter: Secondary | ICD-10-CM

## 2015-04-27 DIAGNOSIS — E291 Testicular hypofunction: Secondary | ICD-10-CM | POA: Diagnosis not present

## 2015-04-27 DIAGNOSIS — M25562 Pain in left knee: Secondary | ICD-10-CM

## 2015-04-27 MED ORDER — DOXYCYCLINE HYCLATE 100 MG PO TABS: ORAL_TABLET | ORAL | Status: AC

## 2015-04-27 MED ORDER — TESTOSTERONE CYPIONATE 200 MG/ML IM SOLN
150.0000 mg | INTRAMUSCULAR | Status: DC
  Administered 2015-04-27: 150 mg via INTRAMUSCULAR

## 2015-04-27 MED ORDER — TRAMADOL HCL 50 MG PO TABS: 50.0000 mg | ORAL_TABLET | Freq: Three times a day (TID) | ORAL | Status: DC | PRN

## 2015-04-27 NOTE — Progress Notes (Signed)
CC: John Sharp is a 54 y.o. male is here for Tick Removal and red spot   Subjective: HPI:  Redness pain and irritation on the left side of the nose that has been present for the past 2 or 3 months. Initially he thought it was due to apparent classes but he stopped wearing his glasses and pain has persisted. Interventions have included moisturizers and antibiotic ointment. Nothing seems to make it better or worse. It's not getting better or worse. Pain is mild in severity.  Complains of a tick bite on the back of the right calf. It's itchy and has a fluctuating amount of redness that surrounds the wound that fluctuates on an hourly basis. He denies any new joint pain or skin changes elsewhere other than that described above. No fevers chills or headaches  Requesting refill on tramadol for left knee pain.     Review Of Systems Outlined In HPI  Past Medical History  Diagnosis Date  . GERD (gastroesophageal reflux disease)   . Erectile dysfunction     Past Surgical History  Procedure Laterality Date  . Appendectomy    . Left ankle repair  2012   No family history on file.  History   Social History  . Marital Status: Married    Spouse Name: N/A  . Number of Children: N/A  . Years of Education: N/A   Occupational History  . Not on file.   Social History Main Topics  . Smoking status: Current Some Day Smoker  . Smokeless tobacco: Not on file  . Alcohol Use: Not on file  . Drug Use: Not on file  . Sexual Activity: Not on file   Other Topics Concern  . Not on file   Social History Narrative     Objective: BP 116/76 mmHg  Pulse 83  Ht 6' (1.829 m)  Wt 245 lb (111.131 kg)  BMI 33.22 kg/m2  General: Alert and Oriented, No Acute Distress HEENT: Pupils equal, round, reactive to light. Conjunctivae clear. Moist because membranes. Unremarkable Lungs: Clear comfortable work of breathing Cardiac: Regular rate and rhythm.  Extremities: No peripheral edema.  Strong  peripheral pulses.  Mental Status: No depression, anxiety, nor agitation. Skin: Warm and dry. Quarter centimeter diameter region of erythema and flaking on the proximal left nose. 1 mm diameter shallow ulceration on the back of the right calf with only mild surrounding erythema  Assessment & Plan: Acea was seen today for tick removal and red spot.  Diagnoses and all orders for this visit:  Hypogonadism in male Orders: -     testosterone cypionate (DEPOTESTOSTERONE CYPIONATE) injection 150 mg; Inject 0.75 mLs (150 mg total) into the muscle every 14 (fourteen) days.  Tick bite Orders: -     doxycycline (VIBRA-TABS) 100 MG tablet; One by mouth twice a day for ten days.  Actinic keratosis  Left knee pain  Other orders -     traMADol (ULTRAM) 50 MG tablet; Take 1 tablet (50 mg total) by mouth every 8 (eight) hours as needed. for pain   Tick bite: Start doxycycline to cover for possible Lyme's disease or [facility] spotted fever but most importantly for a mild cellulitis is present around the site of the wound. Actinic keratosis: Offered cryotherapy today but he rode his motorcycle here and would have to wear eyewear that would irritate the site that would be treated, he prefer to push this off until he comes back for another testosterone injection.  Return if symptoms  worsen or fail to improve.

## 2015-05-07 ENCOUNTER — Other Ambulatory Visit: Payer: Self-pay | Admitting: Sports Medicine

## 2015-05-11 ENCOUNTER — Encounter: Payer: Self-pay | Admitting: Family Medicine

## 2015-05-11 ENCOUNTER — Ambulatory Visit (INDEPENDENT_AMBULATORY_CARE_PROVIDER_SITE_OTHER): Admitting: Family Medicine

## 2015-05-11 VITALS — BP 131/78 | HR 74 | Wt 235.0 lb

## 2015-05-11 DIAGNOSIS — L570 Actinic keratosis: Secondary | ICD-10-CM

## 2015-05-11 DIAGNOSIS — E291 Testicular hypofunction: Secondary | ICD-10-CM

## 2015-05-11 MED ORDER — TESTOSTERONE CYPIONATE 200 MG/ML IM SOLN
150.0000 mg | Freq: Once | INTRAMUSCULAR | Status: AC
  Administered 2015-05-11: 150 mg via INTRAMUSCULAR

## 2015-05-11 NOTE — Progress Notes (Signed)
CC: John Sharp is a 54 y.o. male is here for check rough lesion on nose and testosterone injection   Subjective: HPI:  Rough flaky spot on the left nose that has been present for matter of months. It seems to be getting larger and more painful. It's only tender to the touch. No interventions as of yet. Denies skin lesions elsewhere. No unintentional weight loss fevers, chills, discharge at the site of discomfort nor swollen lymph nodes. Symptoms are mild in severity. Nothing seems to make it better or worse other than that described above   Review Of Systems Outlined In HPI  Past Medical History  Diagnosis Date  . GERD (gastroesophageal reflux disease)   . Erectile dysfunction     Past Surgical History  Procedure Laterality Date  . Appendectomy    . Left ankle repair  2012   No family history on file.  History   Social History  . Marital Status: Married    Spouse Name: N/A  . Number of Children: N/A  . Years of Education: N/A   Occupational History  . Not on file.   Social History Main Topics  . Smoking status: Current Some Day Smoker  . Smokeless tobacco: Not on file  . Alcohol Use: Not on file  . Drug Use: Not on file  . Sexual Activity: Not on file   Other Topics Concern  . Not on file   Social History Narrative     Objective: BP 131/78 mmHg  Pulse 74  Wt 235 lb (106.595 kg)  Vital signs reviewed. General: Alert and Oriented, No Acute Distress HEENT: Pupils equal, round, reactive to light. Conjunctivae clear.  External ears unremarkable.  Moist mucous membranes. Lungs: Clear and comfortable work of breathing, speaking in full sentences without accessory muscle use. Cardiac: Regular rate and rhythm.  Neuro: CN II-XII grossly intact, gait normal. Extremities: No peripheral edema.  Strong peripheral pulses.  Mental Status: No depression, anxiety, nor agitation. Logical though process. Skin: Warm and dry. Left superior lateral side of the nose there is a 1  cm long by half centimeter diameter patch of flaky skin slightly erythematous tender to the touch. Assessment & Plan: John Sharp was seen today for check rough lesion on nose and testosterone injection.  Diagnoses and all orders for this visit:  Male hypogonadism Orders: -     testosterone cypionate (DEPOTESTOSTERONE CYPIONATE) injection 150 mg; Inject 0.75 mLs (150 mg total) into the muscle once.  Actinic keratoses   Actinic keratosis: Treated with cryotherapy today after verbal consent obtained. If this does not slough off after a few days to weeks will repeat it when he comes back for testosterone in the future. Cryotherapy Procedure Note  Pre-operative Diagnosis: Actinic keratosis  Post-operative Diagnosis: Actinic keratosis  Locations: left nose  Indications: premalignant  Anesthesia: none  Procedure Details  History of allergy to iodine: no. Pacemaker? no.  Patient informed of risks (permanent scarring, infection, light or dark discoloration, bleeding, infection, weakness, numbness and recurrence of the lesion) and benefits of the procedure and verbal informed consent obtained.  The areas are treated with liquid nitrogen therapy, frozen until ice ball extended 2 mm beyond lesion, allowed to thaw, and treated again. The patient tolerated procedure well.  The patient was instructed on post-op care, warned that there may be blister formation, redness and pain. Recommend OTC analgesia as needed for pain.  Condition: Stable  Complications: none.  Plan: 1. Instructed to keep the area dry and covered for  24-48h and clean thereafter. 2. Warning signs of infection were reviewed.   3. Recommended that the patient use OTC analgesics as needed for pain.  4. Return PRN   Return if symptoms worsen or fail to improve.

## 2015-05-15 ENCOUNTER — Telehealth: Payer: Self-pay | Admitting: Physician Assistant

## 2015-05-15 NOTE — Telephone Encounter (Signed)
Patient's wife walked in and adv he needs refill of Gabapentin 600mg  sent to [facility]. Thanks

## 2015-05-15 NOTE — Telephone Encounter (Signed)
Patient does not take this medication.

## 2015-05-23 ENCOUNTER — Other Ambulatory Visit: Payer: Self-pay | Admitting: Family Medicine

## 2015-05-25 ENCOUNTER — Ambulatory Visit (INDEPENDENT_AMBULATORY_CARE_PROVIDER_SITE_OTHER): Admitting: Sports Medicine

## 2015-05-25 VITALS — BP 135/89 | HR 79 | Wt 235.0 lb

## 2015-05-25 DIAGNOSIS — E291 Testicular hypofunction: Secondary | ICD-10-CM

## 2015-05-25 MED ORDER — TESTOSTERONE CYPIONATE 200 MG/ML IM SOLN
150.0000 mg | Freq: Once | INTRAMUSCULAR | Status: AC
  Administered 2015-05-25: 150 mg via INTRAMUSCULAR

## 2015-05-25 NOTE — Progress Notes (Signed)
   Subjective:    Patient ID: John Sharp, male    DOB: 01-31-1961, 54 y.o.   MRN: [id number]  HPI Patient came into office today for testosterone injection. Denies chest pain, shortness of breath, headaches and problems associated with taking this medication. Patient states he has had no abnornal mood swings.    Review of Systems     Objective:   Physical Exam        Assessment & Plan:  Patient tolerated injection in [place] well without complications. Patient advised to schedule his next injection for 2 weeks from today.

## 2015-05-25 NOTE — Assessment & Plan Note (Signed)
Testosterone injection as above. 

## 2015-06-06 ENCOUNTER — Ambulatory Visit

## 2015-06-08 ENCOUNTER — Ambulatory Visit (INDEPENDENT_AMBULATORY_CARE_PROVIDER_SITE_OTHER): Admitting: Sports Medicine

## 2015-06-08 VITALS — BP 127/88 | HR 76 | Wt 235.0 lb

## 2015-06-08 DIAGNOSIS — E291 Testicular hypofunction: Secondary | ICD-10-CM | POA: Diagnosis not present

## 2015-06-08 MED ORDER — TESTOSTERONE CYPIONATE 200 MG/ML IM SOLN
200.0000 mg | Freq: Once | INTRAMUSCULAR | Status: AC
  Administered 2015-06-08: 200 mg via INTRAMUSCULAR

## 2015-06-08 NOTE — Progress Notes (Signed)
   Subjective:    Patient ID: John Sharp, male    DOB: 1961-04-12, 54 y.o.   MRN: [id number]  HPI  Patient came into office today for testosterone injection. Denies chest pain, shortness of breath, headaches and problems associated with taking this medication. Patient states he has had no abnornal mood swings.   Review of Systems     Objective:   Physical Exam        Assessment & Plan:  Patient tolerated injection in [place] well without complications. Patient advised to schedule his next injection for 2 weeks from today.

## 2015-06-08 NOTE — Assessment & Plan Note (Signed)
Testosterone injection as above. 

## 2015-06-21 ENCOUNTER — Ambulatory Visit (INDEPENDENT_AMBULATORY_CARE_PROVIDER_SITE_OTHER): Admitting: Sports Medicine

## 2015-06-21 VITALS — BP 138/85 | HR 73 | Wt 235.0 lb

## 2015-06-21 DIAGNOSIS — E291 Testicular hypofunction: Secondary | ICD-10-CM | POA: Diagnosis not present

## 2015-06-21 MED ORDER — TESTOSTERONE CYPIONATE 200 MG/ML IM SOLN
150.0000 mg | Freq: Once | INTRAMUSCULAR | Status: AC
  Administered 2015-06-21: 150 mg via INTRAMUSCULAR

## 2015-06-21 NOTE — Assessment & Plan Note (Signed)
Testosterone injection as above. 

## 2015-06-21 NOTE — Progress Notes (Signed)
   Subjective:    Patient ID: John Sharp, male    DOB: 08-21-1961, 54 y.o.   MRN: [id number]  HPI  Patient came into office today for testosterone injection. Denies chest pain, shortness of breath, headaches and problems associated with taking this medication. Patient states he has had no abnornal mood swings.   Review of Systems     Objective:   Physical Exam        Assessment & Plan:  Patient tolerated injection in [place] well without complications. Patient advised to schedule his next injection for 2 weeks from today.

## 2015-06-22 ENCOUNTER — Ambulatory Visit

## 2015-06-25 ENCOUNTER — Other Ambulatory Visit: Payer: Self-pay | Admitting: Sports Medicine

## 2015-07-06 ENCOUNTER — Ambulatory Visit (INDEPENDENT_AMBULATORY_CARE_PROVIDER_SITE_OTHER): Admitting: Physician Assistant

## 2015-07-06 VITALS — BP 149/94 | HR 94 | Wt 235.0 lb

## 2015-07-06 DIAGNOSIS — E291 Testicular hypofunction: Secondary | ICD-10-CM | POA: Diagnosis not present

## 2015-07-06 MED ORDER — TESTOSTERONE CYPIONATE 200 MG/ML IM SOLN
150.0000 mg | Freq: Once | INTRAMUSCULAR | Status: AC
  Administered 2015-07-06: 150 mg via INTRAMUSCULAR

## 2015-07-06 NOTE — Progress Notes (Signed)
   Subjective:    Patient ID: John Sharp, male    DOB: [birth year], 54 y.o.   MRN: [id number]  HPI  Patient came into office today for testosterone injection. Denies chest pain, shortness of breath, headaches and problems associated with taking this medication. Patient states he has had no abnornal mood swings.   Review of Systems     Objective:   Physical Exam        Assessment & Plan:  Patient tolerated injection in [place] well without complications. Patient advised to schedule his next injection for 2 weeks from today.

## 2015-07-11 ENCOUNTER — Other Ambulatory Visit: Payer: Self-pay | Admitting: Sports Medicine

## 2015-07-11 ENCOUNTER — Other Ambulatory Visit: Payer: Self-pay | Admitting: *Deleted

## 2015-07-11 MED ORDER — GABAPENTIN 300 MG PO CAPS: 300.0000 mg | ORAL_CAPSULE | Freq: Three times a day (TID) | ORAL | Status: DC

## 2015-07-20 ENCOUNTER — Ambulatory Visit (INDEPENDENT_AMBULATORY_CARE_PROVIDER_SITE_OTHER): Admitting: Physician Assistant

## 2015-07-20 VITALS — BP 135/84 | HR 82 | Wt 235.0 lb

## 2015-07-20 DIAGNOSIS — E291 Testicular hypofunction: Secondary | ICD-10-CM | POA: Diagnosis not present

## 2015-07-20 MED ORDER — TESTOSTERONE CYPIONATE 200 MG/ML IM SOLN
150.0000 mg | Freq: Once | INTRAMUSCULAR | Status: AC
  Administered 2015-07-20: 150 mg via INTRAMUSCULAR

## 2015-07-20 NOTE — Progress Notes (Signed)
   Subjective:    Patient ID: John Sharp, male    DOB: 09/10/1961, 54 y.o.   MRN: [id number]  HPI  Patient came into office today for testosterone injection. Denies chest pain, shortness of breath, headaches and problems associated with taking this medication. Patient states he has had no abnornal mood swings.   Review of Systems     Objective:   Physical Exam        Assessment & Plan:  Patient tolerated injection in [place] well without complications. Patient advised to schedule his next injection for 2 weeks from today. 

## 2015-07-23 NOTE — Progress Notes (Signed)
Next nurse visit changed into an office visit. Pt aware.

## 2015-08-01 ENCOUNTER — Other Ambulatory Visit: Payer: Self-pay | Admitting: Physician Assistant

## 2015-08-02 ENCOUNTER — Ambulatory Visit

## 2015-08-03 ENCOUNTER — Ambulatory Visit

## 2015-08-03 ENCOUNTER — Ambulatory Visit (INDEPENDENT_AMBULATORY_CARE_PROVIDER_SITE_OTHER): Admitting: Physician Assistant

## 2015-08-03 ENCOUNTER — Encounter: Payer: Self-pay | Admitting: Physician Assistant

## 2015-08-03 VITALS — BP 131/75 | HR 82 | Ht 72.0 in | Wt 239.0 lb

## 2015-08-03 DIAGNOSIS — Z13220 Encounter for screening for lipoid disorders: Secondary | ICD-10-CM

## 2015-08-03 DIAGNOSIS — Z131 Encounter for screening for diabetes mellitus: Secondary | ICD-10-CM

## 2015-08-03 DIAGNOSIS — R5383 Other fatigue: Secondary | ICD-10-CM | POA: Diagnosis not present

## 2015-08-03 DIAGNOSIS — E291 Testicular hypofunction: Secondary | ICD-10-CM

## 2015-08-03 DIAGNOSIS — Z79899 Other long term (current) drug therapy: Secondary | ICD-10-CM

## 2015-08-03 MED ORDER — SUCRALFATE 1 G PO TABS: 1.0000 g | ORAL_TABLET | Freq: Three times a day (TID) | ORAL | Status: DC

## 2015-08-03 MED ORDER — TESTOSTERONE CYPIONATE 200 MG/ML IM SOLN
200.0000 mg | Freq: Once | INTRAMUSCULAR | Status: AC
  Administered 2015-08-03: 200 mg via INTRAMUSCULAR

## 2015-08-03 NOTE — Patient Instructions (Addendum)
Gabapentin( how frequent taking it) Mobic are you taking?   Consider probiotic.  Tumeric(inflammation) may want to consider for inflammation.

## 2015-08-03 NOTE — Progress Notes (Addendum)
John Sharp is a 54 y.o. male who presents to [facility] today for testosterone injection. He states that he has not been feeling well since Tuesday and that he has a decrease in appetite, but has been eating normally. He has also been experiencing chills, hot flashes, and dizziness. He denies any abdominal pain, N/V/D, or blood in his stool. He also denies headache, myalgias, fever, or chest pain. He has a history of elevated PSA, but is not currently experiencing urinary symptoms including as dysuria, oliguria, or weak stream. He is not sure if he is taking mobic or if he has missed any doses of Neurontin.    Past Medical History  Diagnosis Date  . GERD (gastroesophageal reflux disease)   . Erectile dysfunction    Past Surgical History  Procedure Laterality Date  . Appendectomy    . Left ankle repair  2012   Social History  Substance Use Topics  . Smoking status: Current Some Day Smoker  . Smokeless tobacco: Not on file  . Alcohol Use: Not on file   family history is not on file.  ROS as above Medications: Current Outpatient Prescriptions  Medication Sig Dispense Refill  . albuterol (PROVENTIL HFA) 108 (90 BASE) MCG/ACT inhaler Inhale 2 puffs into the lungs every 6 (six) hours as needed for wheezing or shortness of breath. 3 each 2  . AMBULATORY NON FORMULARY MEDICATION CPAP full face mask and associated equipment. 1 Device 0  . aspirin 325 MG tablet Take 325 mg by mouth daily.    [name] CIALIS 2.5 MG TABS TAKE 1 TABLET DAILY FOR ERECTILE DYSFUNCTION 1 tablet 0  . fish oil-omega-3 fatty acids 1000 MG capsule Take 2 g by mouth daily.    [name] gabapentin (NEURONTIN) 300 MG capsule Take 1 capsule (300 mg total) by mouth 3 (three) times daily. 90 capsule 1  . meloxicam (MOBIC) 15 MG tablet TAKE 1 TABLET EVERY MORNING WITH BREAKFAST FOR 2 WEEKS THEN TAKE 1 TABLET EVERY DAY AS NEEDED FOR PA 30 tablet 1  . metaxalone (SKELAXIN) 800 MG tablet Take 1 tablet  (800 mg total) by mouth 3 (three) times daily. 90 tablet 0  . mometasone (NASONEX) 50 MCG/ACT nasal spray Place 2 sprays into the nose daily. 17 g 12  . montelukast (SINGULAIR) 10 MG tablet TAKE 1 TABLET AT BEDTIME 90 tablet 0  . Multiple Vitamin (MULTIVITAMIN) capsule Take 1 capsule by mouth daily.    . pantoprazole (PROTONIX) 40 MG tablet TAKE 1 TABLET (40 MG TOTAL) BY MOUTH 2 (TWO) TIMES DAILY. 180 tablet 4  . Pyridoxine HCl (VITAMIN B-6) 250 MG tablet Take 250 mg by mouth daily.    . SKELAXIN 800 MG tablet TAKE 1 TABLET THREE TIMES A DAY 90 tablet 0  . testosterone cypionate (DEPOTESTOTERONE CYPIONATE) 200 MG/ML injection Inject 150 mg into the muscle every 14 (fourteen) days.    . traMADol (ULTRAM) 50 MG tablet TAKE 1 TABLET EVERY 8 HOURS AS NEEDED FOR PAIN 60 tablet 0  . vitamin B-12 (CYANOCOBALAMIN) 250 MCG tablet Take 250 mcg by mouth daily.    . vitamin C (ASCORBIC ACID) 500 MG tablet Take 500 mg by mouth daily.     No current facility-administered medications for this visit.   No Known Allergies   Exam:  BP 131/75 mmHg  Pulse 82  Ht 6' (1.829 m)  Wt 239 lb (108.41 kg)  BMI 32.41 kg/m2 Gen: Well NAD HEENT: EOMI,  MMM Lungs:  Normal work of breathing. CTABL Heart: RRR no MRG Abd: NABS, mild distention. No pain, tenderness, or guarding elicited on light or deep palpation. No masses palpated Exts: Brisk capillary refill, warm and well perfused.   No results found for this or any previous visit (from the past 24 hour(s)). No results found.   Assessment and Plan:  Hypogonadism- Administered testosterone injection in office today. Will check testosterone, cbc, psa, hepatic function in one week.   Elevated PSA- Ordered lab work to evaluate PSA levels. Could be due to testosterone injections. AUA 0. Denies any urological symptoms.   Bloating/abdominal fullness/no energy- Possible medication induced. Instructed patient to reconcile medications at home and discontinue mobic.  mobic could be making GERD worse and causing symptoms. Pt does not seem to be taking gabapentin as directed could be having side effects from going on and off. Certainly could be a mild gastroenteritis like viral. Patient instructed to call if symptoms do not improve within a week.   Fasting labs ordered today.   Reviewed and changes made by Iran Planas PA-C

## 2015-08-07 ENCOUNTER — Other Ambulatory Visit: Payer: Self-pay | Admitting: *Deleted

## 2015-08-07 MED ORDER — TRAMADOL HCL 50 MG PO TABS: 50.0000 mg | ORAL_TABLET | Freq: Three times a day (TID) | ORAL | Status: DC | PRN

## 2015-08-10 ENCOUNTER — Ambulatory Visit (INDEPENDENT_AMBULATORY_CARE_PROVIDER_SITE_OTHER): Admitting: Physician Assistant

## 2015-08-10 VITALS — Temp 98.8°F

## 2015-08-10 DIAGNOSIS — Z23 Encounter for immunization: Secondary | ICD-10-CM | POA: Diagnosis not present

## 2015-08-11 LAB — CBC WITH DIFFERENTIAL/PLATELET
Basophils Absolute: 0 10*3/uL (ref 0.0–0.1)
Basophils Relative: 0 % (ref 0–1)
EOS PCT: 2 % (ref 0–5)
Eosinophils Absolute: 0.1 10*3/uL (ref 0.0–0.7)
HEMATOCRIT: 40.8 % (ref 39.0–52.0)
HEMOGLOBIN: 14.3 g/dL (ref 13.0–17.0)
LYMPHS PCT: 39 % (ref 12–46)
Lymphs Abs: 2.1 10*3/uL (ref 0.7–4.0)
MCH: 30.4 pg (ref 26.0–34.0)
MCHC: 35 g/dL (ref 30.0–36.0)
MCV: 86.8 fL (ref 78.0–100.0)
MONO ABS: 0.6 10*3/uL (ref 0.1–1.0)
MONOS PCT: 10 % (ref 3–12)
MPV: 10.3 fL (ref 8.6–12.4)
NEUTROS ABS: 2.7 10*3/uL (ref 1.7–7.7)
Neutrophils Relative %: 49 % (ref 43–77)
Platelets: 214 10*3/uL (ref 150–400)
RBC: 4.7 MIL/uL (ref 4.22–5.81)
RDW: 13.5 % (ref 11.5–15.5)
WBC: 5.5 10*3/uL (ref 4.0–10.5)

## 2015-08-11 LAB — LIPID PANEL
CHOLESTEROL: 136 mg/dL (ref 125–200)
HDL: 37 mg/dL — AB (ref >=40)
LDL Cholesterol: 78 mg/dL (ref <130)
TRIGLYCERIDES: 105 mg/dL (ref <150)
Total CHOL/HDL Ratio: 3.7 Ratio (ref <=5.0)
VLDL: 21 mg/dL (ref <30)

## 2015-08-11 LAB — COMPLETE METABOLIC PANEL WITH GFR
ALBUMIN: 4 g/dL (ref 3.6–5.1)
ALK PHOS: 56 U/L (ref 40–115)
ALT: 25 U/L (ref 9–46)
AST: 26 U/L (ref 10–35)
BUN: 12 mg/dL (ref 7–25)
CALCIUM: 9 mg/dL (ref 8.6–10.3)
CHLORIDE: 102 mmol/L (ref 98–110)
CO2: 29 mmol/L (ref 20–31)
Creat: 1.13 mg/dL (ref 0.70–1.33)
GFR, EST AFRICAN AMERICAN: 85 mL/min (ref >=60)
GFR, EST NON AFRICAN AMERICAN: 74 mL/min (ref >=60)
Glucose, Bld: 81 mg/dL (ref 65–99)
POTASSIUM: 4.3 mmol/L (ref 3.5–5.3)
Sodium: 141 mmol/L (ref 135–146)
Total Bilirubin: 0.5 mg/dL (ref 0.2–1.2)
Total Protein: 6.4 g/dL (ref 6.1–8.1)

## 2015-08-11 LAB — PSA, TOTAL AND FREE
PSA FREE PCT: 18 % — AB (ref >25)
PSA, Free: 0.76 ng/mL
PSA: 4.18 ng/mL — AB (ref <=4.00)

## 2015-08-11 LAB — TSH: TSH: 2.679 u[IU]/mL (ref 0.350–4.500)

## 2015-08-11 LAB — VITAMIN B12: VITAMIN B 12: 565 pg/mL (ref 211–911)

## 2015-08-13 LAB — TESTOSTERONE, FREE, TOTAL, SHBG
Sex Hormone Binding: 11 nmol/L (ref 10–50)
TESTOSTERONE FREE: 179.6 pg/mL (ref 47.0–244.0)
TESTOSTERONE-% FREE: 3.3 % — AB (ref 1.6–2.9)
TESTOSTERONE: 543 ng/dL (ref 300–890)

## 2015-08-17 ENCOUNTER — Ambulatory Visit (INDEPENDENT_AMBULATORY_CARE_PROVIDER_SITE_OTHER): Admitting: Sports Medicine

## 2015-08-17 VITALS — BP 140/92 | HR 77

## 2015-08-17 DIAGNOSIS — E291 Testicular hypofunction: Secondary | ICD-10-CM

## 2015-08-17 MED ORDER — TESTOSTERONE CYPIONATE 200 MG/ML IM SOLN
200.0000 mg | Freq: Once | INTRAMUSCULAR | Status: AC
  Administered 2015-08-17: 200 mg via INTRAMUSCULAR

## 2015-08-17 NOTE — Progress Notes (Signed)
   Subjective:    Patient ID: John Sharp, male    DOB: 06-Sep-1961, 54 y.o.   MRN: [id number]  HPI  Javoni is here for a testosterone injection.  Denies SOB, chest pain, headaches, and problems with medications.   Review of Systems     Objective:   Physical Exam  [place]ney tolerated injection well without any complication.  Pt advised to make next appointment in 14 days for testosterone injection.      Assessment & Plan:

## 2015-08-31 ENCOUNTER — Ambulatory Visit (INDEPENDENT_AMBULATORY_CARE_PROVIDER_SITE_OTHER): Admitting: Family Medicine

## 2015-08-31 VITALS — BP 136/82 | HR 88 | Wt 230.0 lb

## 2015-08-31 DIAGNOSIS — E291 Testicular hypofunction: Secondary | ICD-10-CM

## 2015-08-31 MED ORDER — TESTOSTERONE CYPIONATE 200 MG/ML IM SOLN
150.0000 mg | Freq: Once | INTRAMUSCULAR | Status: AC
  Administered 2015-08-31: 150 mg via INTRAMUSCULAR

## 2015-08-31 NOTE — Progress Notes (Signed)
   Subjective:    Patient ID: John Sharp, male    DOB: 15-May-1961, 54 y.o.   MRN: [id number]  HPI    Review of Systems     Objective:   Physical Exam        Assessment & Plan:  Agree with below.   Beatrice Lecher, MD

## 2015-08-31 NOTE — Progress Notes (Signed)
   Subjective:    Patient ID: John Sharp, male    DOB: 07/31/1961, 54 y.o.   MRN: [id number]  HPI  Patient came into office today for testosterone injection. Denies chest pain, shortness of breath, headaches and problems associated with taking this medication. Patient states he has had no abnornal mood swings.   Review of Systems     Objective:   Physical Exam        Assessment & Plan:  Patient tolerated injection in [place] well without complications. Patient advised to schedule his next injection for 2 weeks from today. 

## 2015-09-03 ENCOUNTER — Other Ambulatory Visit: Payer: Self-pay | Admitting: Physician Assistant

## 2015-09-14 ENCOUNTER — Ambulatory Visit (INDEPENDENT_AMBULATORY_CARE_PROVIDER_SITE_OTHER): Admitting: Physician Assistant

## 2015-09-14 VITALS — BP 132/77 | HR 80 | Wt 241.0 lb

## 2015-09-14 DIAGNOSIS — E291 Testicular hypofunction: Secondary | ICD-10-CM

## 2015-09-14 MED ORDER — TESTOSTERONE CYPIONATE 200 MG/ML IM SOLN
150.0000 mg | Freq: Once | INTRAMUSCULAR | Status: AC
  Administered 2015-09-14: 150 mg via INTRAMUSCULAR

## 2015-09-14 NOTE — Progress Notes (Signed)
   Subjective:    Patient ID: John Sharp, male    DOB: [birth year], 54 y.o.   MRN: [id number]  HPI  Patient came into office today for testosterone injection. Denies chest pain, shortness of breath, headaches and problems associated with taking this medication. Patient states he has had no abnornal mood swings. Wife accompanied Pt to visit today, reports Pt has been complaining of Rt knee pain.   Review of Systems     Objective:   Physical Exam        Assessment & Plan:  Patient tolerated injection in [place] well without complications. Patient advised to schedule and appt with Dr. Dianah Field for his knee pain and next injection for 2 weeks from today.

## 2015-09-28 ENCOUNTER — Ambulatory Visit (INDEPENDENT_AMBULATORY_CARE_PROVIDER_SITE_OTHER): Admitting: Sports Medicine

## 2015-09-28 ENCOUNTER — Encounter: Payer: Self-pay | Admitting: Sports Medicine

## 2015-09-28 VITALS — BP 152/86 | HR 76 | Wt 241.0 lb

## 2015-09-28 DIAGNOSIS — M170 Bilateral primary osteoarthritis of knee: Secondary | ICD-10-CM | POA: Diagnosis not present

## 2015-09-28 DIAGNOSIS — E291 Testicular hypofunction: Secondary | ICD-10-CM

## 2015-09-28 MED ORDER — TESTOSTERONE CYPIONATE 200 MG/ML IM SOLN
150.0000 mg | Freq: Once | INTRAMUSCULAR | Status: AC
  Administered 2015-09-28: 150 mg via INTRAMUSCULAR

## 2015-09-28 MED ORDER — TRAMADOL-ACETAMINOPHEN 37.5-325 MG PO TABS: 1.0000 | ORAL_TABLET | Freq: Three times a day (TID) | ORAL | Status: DC | PRN

## 2015-09-28 NOTE — Progress Notes (Signed)
  Subjective:    CC: Bilateral knee pain  HPI: John Sharp is a pleasant 54 year old male with known mild knee osteoarthritis, about a year and a half ago we started meloxicam, he did eventually develop some dyspepsia, he does have a history of Barrett's esophagus. He now has mild to moderate pain that he localizes at the medial joint line of both knees with gelling, no mechanical symptoms. Pain is improved slightly with tramadol and Tylenol. He doesn't desire any aggressive treatment.  Past medical history, Surgical history, Family history not pertinant except as noted below, Social history, Allergies, and medications have been entered into the medical record, reviewed, and no changes needed.   Review of Systems: No fevers, chills, night sweats, weight loss, chest pain, or shortness of breath.   Objective:    General: Well Developed, well nourished, and in no acute distress.  Neuro: Alert and oriented x3, extra-ocular muscles intact, sensation grossly intact.  HEENT: Normocephalic, atraumatic, pupils equal round reactive to light, neck supple, no masses, no lymphadenopathy, thyroid nonpalpable.  Skin: Warm and dry, no rashes. Cardiac: Regular rate and rhythm, no murmurs rubs or gallops, no lower extremity edema.  Respiratory: Clear to auscultation bilaterally. Not using accessory muscles, speaking in full sentences. Bilateral knees: Minimal swelling with tenderness to the medial joint line bilaterally ROM normal in flexion and extension and lower leg rotation. Ligaments with solid consistent endpoints including ACL, PCL, LCL, MCL. Negative Mcmurray's and provocative meniscal tests. Non painful patellar compression. Patellar and quadriceps tendons unremarkable. Hamstring and quadriceps strength is normal.  Impression and Recommendations:

## 2015-09-28 NOTE — Assessment & Plan Note (Signed)
Very mild pain at the medial joint line without major mechanical symptoms. Did have some dyspepsia with meloxicam, history of Barrett's esophagus. Now taking occasional Tylenol and occasional tramadol, I'm going to switch him to Ultracet. Meniscal tear rehabilitation exercises. Return to see me in an as-needed basis.

## 2015-10-12 ENCOUNTER — Ambulatory Visit (INDEPENDENT_AMBULATORY_CARE_PROVIDER_SITE_OTHER): Admitting: Physician Assistant

## 2015-10-12 VITALS — BP 139/89 | HR 93 | Ht 72.0 in | Wt 253.0 lb

## 2015-10-12 DIAGNOSIS — E291 Testicular hypofunction: Secondary | ICD-10-CM | POA: Diagnosis not present

## 2015-10-12 MED ORDER — TESTOSTERONE CYPIONATE 200 MG/ML IM SOLN
150.0000 mg | INTRAMUSCULAR | Status: DC
  Administered 2015-10-12: 150 mg via INTRAMUSCULAR

## 2015-10-12 NOTE — Progress Notes (Signed)
Patient was in the office for testosterone injection. 150 ml of depotestosterone was give RUOQ. Patient denied any headaches, shortness of breath, dizziness or mood changes. Rhonda Cunningham,CMA

## 2015-10-14 ENCOUNTER — Other Ambulatory Visit: Payer: Self-pay | Admitting: Physician Assistant

## 2015-10-24 ENCOUNTER — Ambulatory Visit: Admitting: Osteopathic Medicine

## 2015-10-26 ENCOUNTER — Ambulatory Visit (INDEPENDENT_AMBULATORY_CARE_PROVIDER_SITE_OTHER): Admitting: Family Medicine

## 2015-10-26 ENCOUNTER — Ambulatory Visit

## 2015-10-26 ENCOUNTER — Encounter: Payer: Self-pay | Admitting: Family Medicine

## 2015-10-26 VITALS — BP 126/79 | HR 78 | Temp 98.7°F | Wt 254.0 lb

## 2015-10-26 DIAGNOSIS — R059 Cough, unspecified: Secondary | ICD-10-CM

## 2015-10-26 DIAGNOSIS — R05 Cough: Secondary | ICD-10-CM

## 2015-10-26 DIAGNOSIS — E291 Testicular hypofunction: Secondary | ICD-10-CM | POA: Diagnosis not present

## 2015-10-26 MED ORDER — HYDROCOD POLST-CPM POLST ER 10-8 MG/5ML PO SUER: 5.0000 mL | Freq: Two times a day (BID) | ORAL | Status: DC | PRN

## 2015-10-26 MED ORDER — TESTOSTERONE CYPIONATE 200 MG/ML IM SOLN
150.0000 mg | Freq: Once | INTRAMUSCULAR | Status: AC
  Administered 2015-10-26: 150 mg via INTRAMUSCULAR

## 2015-10-26 NOTE — Progress Notes (Signed)
CC: John Sharp is a 54 y.o. male is here for Cough and Hypogonadism   Subjective: HPI:  Ever since Monday he's had a nonproductive cough. It's accompanied by nasal congestion. He states he feels fine other than his cough. Symptoms are mild in severity. Nothing seems to make it worse but it is improved with DayQuil and NyQuil. He denies chest pain, shortness of breath, wheezing or fever. Denies facial pressure or headache.   Review Of Systems Outlined In HPI  Past Medical History  Diagnosis Date  . GERD (gastroesophageal reflux disease)   . Erectile dysfunction     Past Surgical History  Procedure Laterality Date  . Appendectomy    . Left ankle repair  2012   No family history on file.  Social History   Social History  . Marital Status: Married    Spouse Name: N/A  . Number of Children: N/A  . Years of Education: N/A   Occupational History  . Not on file.   Social History Main Topics  . Smoking status: Current Some Day Smoker  . Smokeless tobacco: Not on file  . Alcohol Use: Not on file  . Drug Use: Not on file  . Sexual Activity: Not on file   Other Topics Concern  . Not on file   Social History Narrative     Objective: BP 126/79 mmHg  Pulse 78  Temp(Src) 98.7 F (37.1 C) (Oral)  Wt 254 lb (115.214 kg)  General: Alert and Oriented, No Acute Distress HEENT: Pupils equal, round, reactive to light. Conjunctivae clear.  External ears unremarkable, canals clear with intact TMs with appropriate landmarks.  Middle ear appears open without effusion. Pink inferior turbinates.  Moist mucous membranes, pharynx without inflammation nor lesions.  Neck supple without palpable lymphadenopathy nor abnormal masses. Lungs: Clear to auscultation bilaterally, no wheezing/ronchi/rales.  Comfortable work of breathing. Good air movement. Extremities: No peripheral edema.  Strong peripheral pulses.  Mental Status: No depression, anxiety, nor agitation. Skin: Warm and  dry.  Assessment & Plan: Zamar was seen today for cough and hypogonadism.  Diagnoses and all orders for this visit:  Hypogonadism in male -     testosterone cypionate (DEPOTESTOSTERONE CYPIONATE) injection 150 mg; Inject 0.75 mLs (150 mg total) into the muscle once.  Cough  Other orders -     chlorpheniramine-HYDROcodone (TUSSIONEX PENNKINETIC ER) 10-8 MG/5ML SUER; Take 5 mLs by mouth every 12 (twelve) hours as needed for cough.   Cough: Reassurance provided most likely viral upper respiratory illness. His lungs sound great. He wants to know if there is some cough medicine intake, he has tolerated Tussionex in the past. If no better on Monday call for an antibiotic.   Return if symptoms worsen or fail to improve.

## 2015-10-30 ENCOUNTER — Other Ambulatory Visit: Payer: Self-pay | Admitting: Physician Assistant

## 2015-11-09 ENCOUNTER — Other Ambulatory Visit: Payer: Self-pay

## 2015-11-09 ENCOUNTER — Ambulatory Visit (INDEPENDENT_AMBULATORY_CARE_PROVIDER_SITE_OTHER): Admitting: Physician Assistant

## 2015-11-09 VITALS — BP 135/99 | HR 87

## 2015-11-09 DIAGNOSIS — E291 Testicular hypofunction: Secondary | ICD-10-CM

## 2015-11-09 MED ORDER — SUCRALFATE 1 G PO TABS: 1.0000 g | ORAL_TABLET | Freq: Three times a day (TID) | ORAL | Status: DC

## 2015-11-09 MED ORDER — TESTOSTERONE CYPIONATE 200 MG/ML IM SOLN
150.0000 mg | Freq: Once | INTRAMUSCULAR | Status: AC
  Administered 2015-11-09: 150 mg via INTRAMUSCULAR

## 2015-11-09 NOTE — Progress Notes (Signed)
   Subjective:    Patient ID: John Sharp, male    DOB: 04-08-1961, 54 y.o.   MRN: [id number]  HPI   Patient was in the office for testosterone injection. 150 ml of depotestosterone was give RUOQ. Patient denied any headaches, shortness of breath, dizziness or mood changes.     Review of Systems     Objective:   Physical Exam        Assessment & Plan:  Patient tolerated injection in RUO[place] well without complications. Patient advised to schedule his next injection in 14 days.

## 2015-11-23 ENCOUNTER — Ambulatory Visit

## 2015-11-26 ENCOUNTER — Ambulatory Visit

## 2015-11-30 ENCOUNTER — Ambulatory Visit (INDEPENDENT_AMBULATORY_CARE_PROVIDER_SITE_OTHER): Admitting: Physician Assistant

## 2015-11-30 VITALS — BP 142/89 | HR 75

## 2015-11-30 DIAGNOSIS — E291 Testicular hypofunction: Secondary | ICD-10-CM | POA: Diagnosis not present

## 2015-11-30 MED ORDER — TESTOSTERONE CYPIONATE 200 MG/ML IM SOLN
150.0000 mg | Freq: Once | INTRAMUSCULAR | Status: AC
  Administered 2015-11-30: 150 mg via INTRAMUSCULAR

## 2015-11-30 NOTE — Progress Notes (Signed)
   Subjective:    Patient ID: John Sharp, male    DOB: 08-26-1961, 55 y.o.   MRN: [id number]  HPI  John Sharp is here for a testosterone injection. Denies chest pain, shortness of breath, headaches or mood changes.    Review of Systems     Objective:   Physical Exam        Assessment & Plan:  Patient tolerated injection well without complications. Patient advised to schedule next injection 14 days from today.

## 2015-12-06 ENCOUNTER — Other Ambulatory Visit: Payer: Self-pay

## 2015-12-06 MED ORDER — MELOXICAM 15 MG PO TABS: 15.0000 mg | ORAL_TABLET | Freq: Every day | ORAL | Status: DC

## 2015-12-06 MED ORDER — GABAPENTIN 300 MG PO CAPS: ORAL_CAPSULE | ORAL | Status: DC

## 2015-12-14 ENCOUNTER — Ambulatory Visit (INDEPENDENT_AMBULATORY_CARE_PROVIDER_SITE_OTHER): Admitting: Physician Assistant

## 2015-12-14 VITALS — BP 146/98 | HR 81

## 2015-12-14 DIAGNOSIS — E291 Testicular hypofunction: Secondary | ICD-10-CM

## 2015-12-14 MED ORDER — TESTOSTERONE CYPIONATE 200 MG/ML IM SOLN: 150.0000 mg | Freq: Once | INTRAMUSCULAR | Status: DC

## 2015-12-14 NOTE — Progress Notes (Signed)
   Subjective:    Patient ID: John Sharp, male    DOB: 1961-09-07, 55 y.o.   MRN: [id number]  HPI  Patient came into office today for testosterone injection. Denies chest pain, shortness of breath, headaches and problems associated with taking this medication. Patient states he has had no abnornal mood swings.   Review of Systems     Objective:   Physical Exam        Assessment & Plan:  Patient tolerated injection in [place] well without complications. Patient advised to schedule his next injection for 2 weeks from today.

## 2015-12-27 ENCOUNTER — Ambulatory Visit (INDEPENDENT_AMBULATORY_CARE_PROVIDER_SITE_OTHER): Admitting: Family Medicine

## 2015-12-27 VITALS — BP 137/88 | HR 79

## 2015-12-27 DIAGNOSIS — E291 Testicular hypofunction: Secondary | ICD-10-CM

## 2015-12-27 MED ORDER — TESTOSTERONE CYPIONATE 200 MG/ML IM SOLN
150.0000 mg | Freq: Once | INTRAMUSCULAR | Status: AC
  Administered 2015-12-27: 150 mg via INTRAMUSCULAR

## 2015-12-27 NOTE — Progress Notes (Signed)
Agree with below. \Catherine Metheney, MD  

## 2015-12-27 NOTE — Progress Notes (Signed)
Mr. Owens Shark presents to clinic today for a testosterone injection.  Pt tolerated well with no complications. -Izell [place] Allis, RMA

## 2016-01-11 ENCOUNTER — Ambulatory Visit (INDEPENDENT_AMBULATORY_CARE_PROVIDER_SITE_OTHER): Admitting: Family Medicine

## 2016-01-11 VITALS — BP 128/76 | HR 88

## 2016-01-11 DIAGNOSIS — E291 Testicular hypofunction: Secondary | ICD-10-CM

## 2016-01-11 MED ORDER — TESTOSTERONE CYPIONATE 200 MG/ML IM SOLN
150.0000 mg | Freq: Once | INTRAMUSCULAR | Status: AC
  Administered 2016-01-11: 150 mg via INTRAMUSCULAR

## 2016-01-11 NOTE — Progress Notes (Signed)
Patient came into office today for testosterone injection. Denies chest pain, shortness of breath, headaches and problems associated with taking this medication. Patient states he has had no abnornal mood swings. Patient tolerated injection in [place] well without complications. Patient advised to schedule his next injection for 2 weeks from today. 

## 2016-01-11 NOTE — Progress Notes (Signed)
Agree with below. \Arnet Hofferber, MD  

## 2016-01-25 ENCOUNTER — Ambulatory Visit (INDEPENDENT_AMBULATORY_CARE_PROVIDER_SITE_OTHER): Admitting: Physician Assistant

## 2016-01-25 VITALS — BP 137/93 | HR 74 | Wt 240.0 lb

## 2016-01-25 DIAGNOSIS — E291 Testicular hypofunction: Secondary | ICD-10-CM

## 2016-01-25 MED ORDER — TESTOSTERONE CYPIONATE 200 MG/ML IM SOLN
150.0000 mg | Freq: Once | INTRAMUSCULAR | Status: AC
  Administered 2016-01-25: 150 mg via INTRAMUSCULAR

## 2016-01-25 NOTE — Progress Notes (Signed)
Patient came into office today for testosterone injection. Denies chest pain, shortness of breath, headaches and problems associated with taking this medication. Patient states he has had no abnornal mood swings. Patient tolerated injection in [place] without complications. Patient advised to schedule his next injection for 2 weeks from today.

## 2016-01-27 ENCOUNTER — Other Ambulatory Visit: Payer: Self-pay | Admitting: Physician Assistant

## 2016-02-08 ENCOUNTER — Ambulatory Visit (INDEPENDENT_AMBULATORY_CARE_PROVIDER_SITE_OTHER): Admitting: Physician Assistant

## 2016-02-08 VITALS — BP 108/74 | HR 90

## 2016-02-08 DIAGNOSIS — E291 Testicular hypofunction: Secondary | ICD-10-CM

## 2016-02-08 MED ORDER — TESTOSTERONE CYPIONATE 200 MG/ML IM SOLN
150.0000 mg | Freq: Once | INTRAMUSCULAR | Status: AC
  Administered 2016-02-08: 150 mg via INTRAMUSCULAR

## 2016-02-08 NOTE — Progress Notes (Signed)
John Sharp is here for a testosterone injection. Denies chest pain, shortness of breath, headaches or mood changes.   Patient tolerated injection well without complications. Patient advised to schedule next injection 14 days from today.

## 2016-02-12 ENCOUNTER — Other Ambulatory Visit: Payer: Self-pay | Admitting: Physician Assistant

## 2016-02-14 ENCOUNTER — Other Ambulatory Visit: Payer: Self-pay | Admitting: Physician Assistant

## 2016-02-21 ENCOUNTER — Ambulatory Visit (INDEPENDENT_AMBULATORY_CARE_PROVIDER_SITE_OTHER): Admitting: Osteopathic Medicine

## 2016-02-21 VITALS — BP 105/70 | HR 94 | Wt 248.0 lb

## 2016-02-21 DIAGNOSIS — E291 Testicular hypofunction: Secondary | ICD-10-CM | POA: Diagnosis not present

## 2016-02-21 MED ORDER — TESTOSTERONE CYPIONATE 200 MG/ML IM SOLN
150.0000 mg | Freq: Once | INTRAMUSCULAR | Status: AC
  Administered 2016-02-21: 150 mg via INTRAMUSCULAR

## 2016-02-21 NOTE — Progress Notes (Signed)
John Sharp is here for a testosterone injection. Denies chest pain, shortness of breath, headaches or mood changes.    Patient tolerated injection well without complications. Patient advised to schedule next injection 14 days from today.

## 2016-02-21 NOTE — Progress Notes (Signed)
Nurses reviewed, nothing to add.

## 2016-03-07 ENCOUNTER — Ambulatory Visit (INDEPENDENT_AMBULATORY_CARE_PROVIDER_SITE_OTHER): Admitting: Physician Assistant

## 2016-03-07 VITALS — BP 134/96 | HR 84

## 2016-03-07 DIAGNOSIS — E291 Testicular hypofunction: Secondary | ICD-10-CM

## 2016-03-07 MED ORDER — TESTOSTERONE CYPIONATE 200 MG/ML IM SOLN
150.0000 mg | Freq: Once | INTRAMUSCULAR | Status: AC
Start: 2016-03-07 — End: 2016-03-07
  Administered 2016-03-07: 150 mg via INTRAMUSCULAR

## 2016-03-07 NOTE — Progress Notes (Signed)
John Sharp is here for a testosterone injection. Denies chest pain, shortness of breath, headaches or mood changes.   Patient tolerated injection well without complications. Patient advised to schedule next injection 14 days from today.

## 2016-03-21 ENCOUNTER — Ambulatory Visit (INDEPENDENT_AMBULATORY_CARE_PROVIDER_SITE_OTHER): Admitting: Family Medicine

## 2016-03-21 VITALS — BP 125/80 | HR 86

## 2016-03-21 DIAGNOSIS — E291 Testicular hypofunction: Secondary | ICD-10-CM | POA: Diagnosis not present

## 2016-03-21 MED ORDER — TESTOSTERONE CYPIONATE 200 MG/ML IM SOLN
150.0000 mg | Freq: Once | INTRAMUSCULAR | Status: AC
  Administered 2016-03-21: 150 mg via INTRAMUSCULAR

## 2016-03-21 NOTE — Progress Notes (Signed)
   Subjective:    Patient ID: John Sharp, male    DOB: [birth year], 55 y.o.   MRN: [id number] Pt in for testosterone injection.  Given LUOQ with no complications. Pt already has appointment for next injection in 2 weeks.  Beatris Ship, CMA HPI    Review of Systems     Objective:   Physical Exam        Assessment & Plan:

## 2016-04-04 ENCOUNTER — Ambulatory Visit (INDEPENDENT_AMBULATORY_CARE_PROVIDER_SITE_OTHER): Admitting: Physician Assistant

## 2016-04-04 VITALS — BP 138/96 | HR 85

## 2016-04-04 DIAGNOSIS — E291 Testicular hypofunction: Secondary | ICD-10-CM

## 2016-04-04 MED ORDER — TESTOSTERONE CYPIONATE 200 MG/ML IM SOLN
150.0000 mg | Freq: Once | INTRAMUSCULAR | Status: AC
  Administered 2016-04-04: 150 mg via INTRAMUSCULAR

## 2016-04-04 NOTE — Progress Notes (Signed)
   Subjective:    Patient ID: John Sharp, male    DOB: 04/16/1961, 55 y.o.   MRN: [id number]  HPI  Patient came into office today for testosterone injection. Denies chest pain, shortness of breath, headaches and problems associated with taking this medication. Patient states he has had no abnornal mood swings.  Review of Systems     Objective:   Physical Exam        Assessment & Plan:  Patient tolerated injection in [place] well without complications. Patient advised to schedule his next injection for 2 weeks from today.

## 2016-04-18 ENCOUNTER — Ambulatory Visit (INDEPENDENT_AMBULATORY_CARE_PROVIDER_SITE_OTHER): Admitting: Physician Assistant

## 2016-04-18 VITALS — BP 122/89 | HR 93 | Wt 248.0 lb

## 2016-04-18 DIAGNOSIS — E291 Testicular hypofunction: Secondary | ICD-10-CM

## 2016-04-18 MED ORDER — TESTOSTERONE CYPIONATE 200 MG/ML IM SOLN
150.0000 mg | Freq: Once | INTRAMUSCULAR | Status: AC
Start: 2016-04-18 — End: 2016-04-18
  Administered 2016-04-18: 150 mg via INTRAMUSCULAR

## 2016-04-18 NOTE — Patient Instructions (Signed)
Pt advised to make appointment in 14 days for next testosterone injection. -EMH/RMA

## 2016-04-18 NOTE — Progress Notes (Signed)
Viral presents to the clinic for his testosterone injection in the [place]. Pt denies chest pain, SOB, and headache. Pt tolerated injection well without any complications. -EMH/RMA

## 2016-05-02 ENCOUNTER — Ambulatory Visit (INDEPENDENT_AMBULATORY_CARE_PROVIDER_SITE_OTHER): Admitting: Physician Assistant

## 2016-05-02 VITALS — BP 132/97 | HR 82 | Wt 253.0 lb

## 2016-05-02 DIAGNOSIS — E291 Testicular hypofunction: Secondary | ICD-10-CM | POA: Diagnosis not present

## 2016-05-02 MED ORDER — TESTOSTERONE CYPIONATE 200 MG/ML IM SOLN
150.0000 mg | Freq: Once | INTRAMUSCULAR | Status: AC
  Administered 2016-05-02: 150 mg via INTRAMUSCULAR

## 2016-05-02 NOTE — Progress Notes (Signed)
Patient came into office today for testosterone injection. Denies chest pain, shortness of breath, headaches and problems associated with taking this medication. Patient states he has had no abnornal mood swings. Patient tolerated injection in [place] well without complications. Patient advised to schedule his next injection for 2 weeks from today. 

## 2016-05-15 ENCOUNTER — Other Ambulatory Visit: Payer: Self-pay | Admitting: Physician Assistant

## 2016-05-16 ENCOUNTER — Encounter: Payer: Self-pay | Admitting: Physician Assistant

## 2016-05-16 ENCOUNTER — Ambulatory Visit (INDEPENDENT_AMBULATORY_CARE_PROVIDER_SITE_OTHER): Admitting: Physician Assistant

## 2016-05-16 VITALS — BP 137/91 | HR 73 | Wt 252.0 lb

## 2016-05-16 DIAGNOSIS — E291 Testicular hypofunction: Secondary | ICD-10-CM | POA: Diagnosis not present

## 2016-05-16 MED ORDER — TESTOSTERONE CYPIONATE 200 MG/ML IM SOLN
150.0000 mg | Freq: Once | INTRAMUSCULAR | Status: AC
  Administered 2016-05-16: 150 mg via INTRAMUSCULAR

## 2016-05-16 NOTE — Patient Instructions (Signed)
Pt advised to make next appointment for t-shot in 14 days.

## 2016-05-16 NOTE — Progress Notes (Signed)
Pt presents for his bi-weekly testosterone injection.  Denies chest pain, SOB, headache, and abnormal mood swings. Patient tolerated injection in [place] well without complications. -EMH/RMA

## 2016-05-30 ENCOUNTER — Ambulatory Visit (INDEPENDENT_AMBULATORY_CARE_PROVIDER_SITE_OTHER): Admitting: Physician Assistant

## 2016-05-30 VITALS — BP 132/86 | HR 78 | Wt 253.0 lb

## 2016-05-30 DIAGNOSIS — E291 Testicular hypofunction: Secondary | ICD-10-CM

## 2016-05-30 MED ORDER — TESTOSTERONE CYPIONATE 200 MG/ML IM SOLN
150.0000 mg | Freq: Once | INTRAMUSCULAR | Status: AC
  Administered 2016-05-30: 150 mg via INTRAMUSCULAR

## 2016-05-30 NOTE — Progress Notes (Signed)
Patient came into office today for testosterone injection. Denies chest pain, shortness of breath, headaches and problems associated with taking this medication. Patient states he has had no abnornal mood swings. Patient tolerated injection in [place] well without complications. Patient advised to schedule his next injection for 2 weeks from today. 

## 2016-06-13 ENCOUNTER — Ambulatory Visit (INDEPENDENT_AMBULATORY_CARE_PROVIDER_SITE_OTHER): Admitting: Physician Assistant

## 2016-06-13 VITALS — BP 131/82 | HR 91 | Wt 232.0 lb

## 2016-06-13 DIAGNOSIS — E291 Testicular hypofunction: Secondary | ICD-10-CM

## 2016-06-13 MED ORDER — TESTOSTERONE CYPIONATE 200 MG/ML IM SOLN
150.0000 mg | Freq: Once | INTRAMUSCULAR | Status: AC
  Administered 2016-06-13: 150 mg via INTRAMUSCULAR

## 2016-06-13 NOTE — Progress Notes (Signed)
Patient here for testosterone injection. Patient denies dizziness, blurred vision, mood changes.

## 2016-06-27 ENCOUNTER — Ambulatory Visit

## 2016-07-04 ENCOUNTER — Ambulatory Visit (INDEPENDENT_AMBULATORY_CARE_PROVIDER_SITE_OTHER): Admitting: Physician Assistant

## 2016-07-04 VITALS — BP 136/80 | HR 84 | Wt 252.0 lb

## 2016-07-04 DIAGNOSIS — E291 Testicular hypofunction: Secondary | ICD-10-CM

## 2016-07-04 MED ORDER — TESTOSTERONE CYPIONATE 200 MG/ML IM SOLN
150.0000 mg | Freq: Once | INTRAMUSCULAR | Status: AC
  Administered 2016-07-04: 150 mg via INTRAMUSCULAR

## 2016-07-04 NOTE — Progress Notes (Signed)
Patient came into office today for testosterone injection. Denies chest pain, shortness of breath, headaches and problems associated with taking this medication. Patient states he has had no abnornal mood swings. Patient tolerated injection in [place] well without complications. Patient advised to schedule his next injection for 2 weeks from today. 

## 2016-07-07 ENCOUNTER — Other Ambulatory Visit: Payer: Self-pay | Admitting: Physician Assistant

## 2016-07-11 ENCOUNTER — Ambulatory Visit

## 2016-07-21 ENCOUNTER — Encounter: Payer: Self-pay | Admitting: Physician Assistant

## 2016-07-21 ENCOUNTER — Ambulatory Visit (INDEPENDENT_AMBULATORY_CARE_PROVIDER_SITE_OTHER): Admitting: Physician Assistant

## 2016-07-21 VITALS — BP 130/98 | HR 84 | Wt 250.0 lb

## 2016-07-21 DIAGNOSIS — E291 Testicular hypofunction: Secondary | ICD-10-CM

## 2016-07-21 MED ORDER — TESTOSTERONE CYPIONATE 200 MG/ML IM SOLN
200.0000 mg | Freq: Once | INTRAMUSCULAR | Status: AC
  Administered 2016-07-21: 150 mg via INTRAMUSCULAR
  Administered 2016-07-21: 200 mg via INTRAMUSCULAR

## 2016-07-21 NOTE — Progress Notes (Signed)
John Sharp presents to clinic for testosterone injection. Denies chest pain, headaches, SOB, and mood changes.  Pt tolerated injection in the LUO[place] well without any complications. Pt advised to schedule next appointment in 14 days. -EMH/RMA

## 2016-07-27 ENCOUNTER — Other Ambulatory Visit: Payer: Self-pay | Admitting: Physician Assistant

## 2016-08-08 ENCOUNTER — Ambulatory Visit (INDEPENDENT_AMBULATORY_CARE_PROVIDER_SITE_OTHER): Admitting: Family Medicine

## 2016-08-08 VITALS — BP 130/88 | HR 78 | Temp 97.8°F

## 2016-08-08 DIAGNOSIS — Z1159 Encounter for screening for other viral diseases: Secondary | ICD-10-CM

## 2016-08-08 DIAGNOSIS — E291 Testicular hypofunction: Secondary | ICD-10-CM

## 2016-08-08 DIAGNOSIS — R972 Elevated prostate specific antigen [PSA]: Secondary | ICD-10-CM

## 2016-08-08 DIAGNOSIS — Z13220 Encounter for screening for lipoid disorders: Secondary | ICD-10-CM

## 2016-08-08 DIAGNOSIS — Z23 Encounter for immunization: Secondary | ICD-10-CM | POA: Diagnosis not present

## 2016-08-08 DIAGNOSIS — Z114 Encounter for screening for human immunodeficiency virus [HIV]: Secondary | ICD-10-CM

## 2016-08-08 DIAGNOSIS — Z0000 Encounter for general adult medical examination without abnormal findings: Secondary | ICD-10-CM

## 2016-08-08 MED ORDER — TESTOSTERONE CYPIONATE 200 MG/ML IM SOLN
150.0000 mg | Freq: Once | INTRAMUSCULAR | Status: AC
  Administered 2016-08-08: 150 mg via INTRAMUSCULAR

## 2016-08-08 NOTE — Progress Notes (Signed)
Patient came into office today for testosterone injection. Denies chest pain, shortness of breath, headaches and problems associated with taking this medication. Patient states he has had no abnornal mood swings. Patient tolerated injection in [place] well without complications. Patient advised to schedule his next injection for 2 weeks from today. Pt advised he is due for blood work, will have labs completed next week.   Patient also wanted to get the flu vaccination today. Patient tolerated injection of flu immunization in [place] well, with no immediate complications. Advised to contact our office with any questions/concerns.

## 2016-08-08 NOTE — Progress Notes (Signed)
Agree with above.  Royetta Probus, MD  

## 2016-08-13 ENCOUNTER — Ambulatory Visit: Admitting: Osteopathic Medicine

## 2016-08-15 LAB — CBC WITH DIFFERENTIAL/PLATELET
BASOS ABS: 0 {cells}/uL (ref 0–200)
BASOS PCT: 0 %
EOS ABS: 92 {cells}/uL (ref 15–500)
EOS PCT: 2 %
HCT: 46.4 % (ref 38.5–50.0)
Hemoglobin: 16.1 g/dL (ref 13.2–17.1)
LYMPHS PCT: 36 %
Lymphs Abs: 1656 cells/uL (ref 850–3900)
MCH: 31.1 pg (ref 27.0–33.0)
MCHC: 34.7 g/dL (ref 32.0–36.0)
MCV: 89.7 fL (ref 80.0–100.0)
MONOS PCT: 9 %
MPV: 11.7 fL (ref 7.5–12.5)
Monocytes Absolute: 414 cells/uL (ref 200–950)
NEUTROS ABS: 2438 {cells}/uL (ref 1500–7800)
Neutrophils Relative %: 53 %
PLATELETS: 205 10*3/uL (ref 140–400)
RBC: 5.17 MIL/uL (ref 4.20–5.80)
RDW: 12.8 % (ref 11.0–15.0)
WBC: 4.6 10*3/uL (ref 3.8–10.8)

## 2016-08-16 LAB — COMPLETE METABOLIC PANEL WITH GFR
ALBUMIN: 4 g/dL (ref 3.6–5.1)
ALK PHOS: 55 U/L (ref 40–115)
ALT: 33 U/L (ref 9–46)
AST: 31 U/L (ref 10–35)
BILIRUBIN TOTAL: 0.8 mg/dL (ref 0.2–1.2)
BUN: 17 mg/dL (ref 7–25)
CO2: 27 mmol/L (ref 20–31)
CREATININE: 1.13 mg/dL (ref 0.70–1.33)
Calcium: 9.5 mg/dL (ref 8.6–10.3)
Chloride: 102 mmol/L (ref 98–110)
GFR, EST AFRICAN AMERICAN: 85 mL/min (ref >=60)
GFR, EST NON AFRICAN AMERICAN: 73 mL/min (ref >=60)
Glucose, Bld: 100 mg/dL — ABNORMAL HIGH (ref 65–99)
Potassium: 4.1 mmol/L (ref 3.5–5.3)
Sodium: 140 mmol/L (ref 135–146)
TOTAL PROTEIN: 6.8 g/dL (ref 6.1–8.1)

## 2016-08-16 LAB — LIPID PANEL
Cholesterol: 162 mg/dL (ref 125–200)
HDL: 44 mg/dL (ref >=40)
LDL CALC: 96 mg/dL (ref <130)
Total CHOL/HDL Ratio: 3.7 Ratio (ref <=5.0)
Triglycerides: 111 mg/dL (ref <150)
VLDL: 22 mg/dL (ref <30)

## 2016-08-16 LAB — HIV ANTIBODY (ROUTINE TESTING W REFLEX): HIV: NONREACTIVE

## 2016-08-16 LAB — HEPATITIS C ANTIBODY: HCV AB: NEGATIVE

## 2016-08-16 LAB — PSA: PSA: 3.3 ng/mL (ref <=4.0)

## 2016-08-16 LAB — TESTOSTERONE: TESTOSTERONE: 365 ng/dL (ref 250–827)

## 2016-08-21 ENCOUNTER — Other Ambulatory Visit: Payer: Self-pay | Admitting: *Deleted

## 2016-08-21 MED ORDER — TESTOSTERONE CYPIONATE 200 MG/ML IM SOLN: 175.0000 mg | INTRAMUSCULAR | 0 refills | Status: DC

## 2016-08-22 ENCOUNTER — Ambulatory Visit (INDEPENDENT_AMBULATORY_CARE_PROVIDER_SITE_OTHER): Admitting: Family Medicine

## 2016-08-22 VITALS — BP 110/81 | HR 94

## 2016-08-22 DIAGNOSIS — E291 Testicular hypofunction: Secondary | ICD-10-CM

## 2016-08-22 MED ORDER — TESTOSTERONE CYPIONATE 200 MG/ML IM SOLN
175.0000 mg | Freq: Once | INTRAMUSCULAR | Status: AC
  Administered 2016-08-22: 175 mg via INTRAMUSCULAR

## 2016-08-22 NOTE — Progress Notes (Signed)
Testosterone given LUOQ without complications. 

## 2016-09-05 ENCOUNTER — Ambulatory Visit (INDEPENDENT_AMBULATORY_CARE_PROVIDER_SITE_OTHER): Admitting: Physician Assistant

## 2016-09-05 VITALS — BP 136/91 | HR 87

## 2016-09-05 DIAGNOSIS — E291 Testicular hypofunction: Secondary | ICD-10-CM

## 2016-09-05 MED ORDER — TESTOSTERONE CYPIONATE 200 MG/ML IM SOLN
175.0000 mg | Freq: Once | INTRAMUSCULAR | Status: AC
  Administered 2016-09-05: 175 mg via INTRAMUSCULAR

## 2016-09-05 NOTE — Progress Notes (Signed)
Patient came into office today for testosterone injection. Denies chest pain, shortness of breath, headaches and problems associated with taking this medication. Patient states he has had no abnornal mood swings. Patient tolerated injection in [place] well without complications. Patient advised to schedule his next injection for 2 weeks from today. 

## 2016-09-12 ENCOUNTER — Telehealth: Payer: Self-pay | Admitting: *Deleted

## 2016-09-12 DIAGNOSIS — E291 Testicular hypofunction: Secondary | ICD-10-CM

## 2016-09-12 NOTE — Telephone Encounter (Signed)
Testosterone ordered & faxed to lab to recheck levels since changing dose.

## 2016-09-13 LAB — TESTOSTERONE: TESTOSTERONE: 496 ng/dL (ref 250–827)

## 2016-09-13 NOTE — Telephone Encounter (Signed)
Perfect. Stay at same dose as long as patient feeling good.

## 2016-09-19 ENCOUNTER — Encounter: Payer: Self-pay | Admitting: Physician Assistant

## 2016-09-19 ENCOUNTER — Ambulatory Visit (INDEPENDENT_AMBULATORY_CARE_PROVIDER_SITE_OTHER): Admitting: Physician Assistant

## 2016-09-19 ENCOUNTER — Ambulatory Visit

## 2016-09-19 VITALS — BP 139/82 | HR 85 | Ht 72.0 in | Wt 258.0 lb

## 2016-09-19 DIAGNOSIS — R059 Cough, unspecified: Secondary | ICD-10-CM

## 2016-09-19 DIAGNOSIS — R05 Cough: Secondary | ICD-10-CM

## 2016-09-19 DIAGNOSIS — G250 Essential tremor: Secondary | ICD-10-CM | POA: Diagnosis not present

## 2016-09-19 DIAGNOSIS — E291 Testicular hypofunction: Secondary | ICD-10-CM

## 2016-09-19 DIAGNOSIS — J0100 Acute maxillary sinusitis, unspecified: Secondary | ICD-10-CM

## 2016-09-19 MED ORDER — HYDROCOD POLST-CPM POLST ER 10-8 MG/5ML PO SUER: 5.0000 mL | Freq: Two times a day (BID) | ORAL | 0 refills | Status: DC | PRN

## 2016-09-19 MED ORDER — AZITHROMYCIN 250 MG PO TABS: ORAL_TABLET | ORAL | 0 refills | Status: DC

## 2016-09-19 MED ORDER — TESTOSTERONE CYPIONATE 200 MG/ML IM SOLN: 200.0000 mg | INTRAMUSCULAR | 0 refills | Status: DC

## 2016-09-19 MED ORDER — TESTOSTERONE CYPIONATE 200 MG/ML IM SOLN
175.0000 mg | Freq: Once | INTRAMUSCULAR | Status: AC
  Administered 2016-09-19: 175 mg via INTRAMUSCULAR

## 2016-09-19 NOTE — Patient Instructions (Signed)
Essential Tremor A tremor is trembling or shaking that you cannot control. Most tremors affect the hands or arms. Tremors can also affect the head, vocal cords, face, and other parts of the body.  Essential tremor is a tremor without a known cause.  CAUSES Essential tremor has no known cause.  RISK FACTORS You may be at greater risk of essential tremor if:   You have a family member with essential tremor.   You are age 54 or older.   You take certain medicines. SIGNS AND SYMPTOMS The main sign of a tremor is uncontrolled and unintentional rhythmic shaking of a body part.  You may have difficulty eating with a spoon or fork.   You may have difficulty writing.   You may nod your head up and down or side to side.   You may have a quivering voice.  Your tremors:  May get worse over time.   May come and go.   May be more noticeable on one side of your body.   May get worse due to stress, fatigue, caffeine, and extreme heat or cold.  DIAGNOSIS Your health care provider can diagnose essential tremor based on your symptoms, medical history, and a physical examination. There is no single test to diagnose an essential tremor. However, your health care provider may perform a variety of tests to rule out other conditions. Tests may include:   Blood and urine tests.   Imaging studies of your brain, such as:   CT scan.   MRI.   A test that measures involuntary muscle movement (electromyogram). TREATMENT Your tremors may go away without treatment. Mild tremors may not need treatment if they do not affect your day-to-day life. Severe tremors may need to be treated using one or a combination of the following options:   Medicines. This may include medicine that is injected.  Lifestyle changes.   Physical therapy.  HOME CARE INSTRUCTIONS  Take medicines only as directed by your health care provider.   Limit alcohol intake to no more than 1 drink per day for  nonpregnant women and 2 drinks per day for men. One drink equals 12 oz of beer, 5 oz of wine, or 1 oz of hard liquor.  Do not use any tobacco products, including cigarettes, chewing tobacco, or electronic cigarettes. If you need help quitting, ask your health care provider.  Take medicines only as directed by your health care provider.   Avoid extreme heat or cold.   Limit the amount of caffeine you consumeas directed by your health care provider.   Try to get eight hours of sleep each night.  Find ways to manage your stress, such as meditation or yoga.  Keep all follow-up visits as directed by your health care provider. This is important. This includes any physical therapy visits. SEEK MEDICAL CARE IF:  You experience any changes in the location or intensity of your tremors.   You start having a tremor after starting a new medicine.   You have tremor with other symptoms such as:   Numbness.   Tingling.   Pain.   Weakness.   Your tremor gets worse.   Your tremor interferes with your daily life.    This information is not intended to replace advice given to you by your health care provider. Make sure you discuss any questions you have with your health care provider.   Document Released: 11/17/2014 Document Reviewed: 11/17/2014 Elsevier Interactive Patient Education 2016 [employer]  

## 2016-09-19 NOTE — Progress Notes (Signed)
   Subjective:    Patient ID: John Sharp, male    DOB: 1961-02-23, 55 y.o.   MRN: [id number]  HPI  Pt presents to the clinic to go over labs and to discuss a few concerns.   He has had a cough for 4 days. Dry not productive. No wheezing, SOB, fever, chills, body aches. Taking OTC cough medications. Does hurt in his center chest to cough. Starting to devleop sinus pain and pressure.   He has also noticed an increase in his tremor. He notices it more when drinking a cup of coffee or writing. He has had for many years but seems to be getting worse. No problems dropping things or walking.     Review of Systems  All other systems reviewed and are negative.      Objective:   Physical Exam  Constitutional: He is oriented to person, place, and time. He appears well-developed and well-nourished.  HENT:  Head: Normocephalic and atraumatic.  Right Ear: External ear normal.  Left Ear: External ear normal.  Nose: Nose normal.  Mouth/Throat: Oropharynx is clear and moist. No oropharyngeal exudate.  Eyes: Conjunctivae are normal. Right eye exhibits no discharge. Left eye exhibits no discharge.  Neck: Normal range of motion. Neck supple. No thyromegaly present.  Cardiovascular: Normal rate, regular rhythm and normal heart sounds.   Pulmonary/Chest: Effort normal and breath sounds normal. He has no wheezes.  Tenderness to palpation over sternum.   Lymphadenopathy:    He has no cervical adenopathy.  Neurological: He is alert and oriented to person, place, and time. He has normal reflexes. No cranial nerve deficit. Coordination normal.  DTR's intact.  Tremor with intention.  NO resting tremor.   Psychiatric: He has a normal mood and affect. His behavior is normal.          Assessment & Plan:  Marland KitchenMarland KitchenDiagnoses and all orders for this visit:  Male hypogonadism -     testosterone cypionate (DEPOTESTOSTERONE CYPIONATE) injection 175 mg; Inject 0.88 mLs (175 mg total) into the muscle once. -      testosterone cypionate (DEPOTESTOSTERONE CYPIONATE) 200 MG/ML injection; Inject 1 mL (200 mg total) into the muscle every 14 (fourteen) days.  Essential tremor -     primidone (MYSOLINE) 50 MG tablet; Take one half tablet at bedtime may increase to 1 full tablet at bedtime after 7 days if tremor persists.  Cough -     chlorpheniramine-HYDROcodone (TUSSIONEX) 10-8 MG/5ML SUER; Take 5 mLs by mouth every 12 (twelve) hours as needed for cough (cough, will cause drowsiness.).  Acute maxillary sinusitis, recurrence not specified -     azithromycin (ZITHROMAX) 250 MG tablet; Take 2 tablets now and then one tablet for 4 days.   Pt's testosterone is on lower side of normal and patient does admit to feeling more tired. Increase to 200mg  every 2 weeks and recheck testosterone in 1 month.   Reassured patient tremor is benign. Started primadone at bedtime.   Cough syrup given. Discussed symptomatic care and that symptoms appear to be viral. If symptoms persist past 5 days add zpak.

## 2016-09-21 ENCOUNTER — Other Ambulatory Visit: Payer: Self-pay | Admitting: Physician Assistant

## 2016-09-21 MED ORDER — PRIMIDONE 50 MG PO TABS: ORAL_TABLET | ORAL | 0 refills | Status: DC

## 2016-09-24 ENCOUNTER — Telehealth: Payer: Self-pay | Admitting: Physician Assistant

## 2016-09-24 ENCOUNTER — Other Ambulatory Visit: Payer: Self-pay | Admitting: Physician Assistant

## 2016-09-24 ENCOUNTER — Other Ambulatory Visit: Payer: Self-pay | Admitting: *Deleted

## 2016-09-24 DIAGNOSIS — R059 Cough, unspecified: Secondary | ICD-10-CM

## 2016-09-24 DIAGNOSIS — R05 Cough: Secondary | ICD-10-CM

## 2016-09-24 MED ORDER — HYDROCOD POLST-CPM POLST ER 10-8 MG/5ML PO SUER: 5.0000 mL | Freq: Two times a day (BID) | ORAL | 0 refills | Status: DC | PRN

## 2016-09-24 NOTE — Telephone Encounter (Signed)
PLEASE ADVISE.

## 2016-09-24 NOTE — Progress Notes (Signed)
Pt is feeling better with a residual cough at bedtime. Will refill once. Pt aware no further refills without office visit.

## 2016-09-24 NOTE — Telephone Encounter (Signed)
Pt's wife called request to know if pt can get another round of that cough syrup called in to pharmacy before they go out of town. Hydrocodone (Tussinex). Thanks

## 2016-09-24 NOTE — Telephone Encounter (Signed)
Pt.notified

## 2016-09-24 NOTE — Telephone Encounter (Signed)
I printed. He will have to pick up. No further refills until office visit.

## 2016-10-06 ENCOUNTER — Ambulatory Visit (INDEPENDENT_AMBULATORY_CARE_PROVIDER_SITE_OTHER): Admitting: Physician Assistant

## 2016-10-06 VITALS — BP 124/76 | HR 101

## 2016-10-06 DIAGNOSIS — E291 Testicular hypofunction: Secondary | ICD-10-CM

## 2016-10-06 MED ORDER — TESTOSTERONE CYPIONATE 200 MG/ML IM SOLN
175.0000 mg | Freq: Once | INTRAMUSCULAR | Status: AC
  Administered 2016-10-06: 175 mg via INTRAMUSCULAR

## 2016-10-06 NOTE — Progress Notes (Signed)
Pt is here for testosterone injection. Pt tolerated injection well in the [place].

## 2016-10-17 ENCOUNTER — Ambulatory Visit (INDEPENDENT_AMBULATORY_CARE_PROVIDER_SITE_OTHER): Admitting: Physician Assistant

## 2016-10-17 DIAGNOSIS — E291 Testicular hypofunction: Secondary | ICD-10-CM | POA: Diagnosis not present

## 2016-10-17 MED ORDER — TESTOSTERONE CYPIONATE 200 MG/ML IM SOLN
200.0000 mg | INTRAMUSCULAR | Status: DC
  Administered 2016-10-17: 200 mg via INTRAMUSCULAR

## 2016-10-17 NOTE — Progress Notes (Signed)
Patient was in the office for Testosterone injection. 200 ml was given LUOQ. Patient denied any headaches, SOB, or dizziness. Patient scheduled appt for next injection in 2 weeks. Rhonda Cunningham,CMA

## 2016-10-24 ENCOUNTER — Ambulatory Visit

## 2016-10-30 ENCOUNTER — Ambulatory Visit

## 2016-11-04 ENCOUNTER — Ambulatory Visit (INDEPENDENT_AMBULATORY_CARE_PROVIDER_SITE_OTHER): Admitting: Physician Assistant

## 2016-11-04 VITALS — BP 125/85 | HR 80

## 2016-11-04 DIAGNOSIS — E291 Testicular hypofunction: Secondary | ICD-10-CM

## 2016-11-04 MED ORDER — TESTOSTERONE CYPIONATE 200 MG/ML IM SOLN
200.0000 mg | INTRAMUSCULAR | Status: DC
  Administered 2016-11-04: 200 mg via INTRAMUSCULAR

## 2016-11-04 NOTE — Progress Notes (Signed)
Patient was in office for Testosterone injection 200 ml of DepoTestosterone was given RUOQ. Patient denied any dizziness, headaches or SOB. Rhonda Cunningham,CMA

## 2016-11-07 ENCOUNTER — Ambulatory Visit

## 2016-11-14 ENCOUNTER — Ambulatory Visit (INDEPENDENT_AMBULATORY_CARE_PROVIDER_SITE_OTHER): Admitting: Physician Assistant

## 2016-11-14 VITALS — BP 127/92 | HR 83

## 2016-11-14 DIAGNOSIS — E291 Testicular hypofunction: Secondary | ICD-10-CM

## 2016-11-14 MED ORDER — TESTOSTERONE CYPIONATE 200 MG/ML IM SOLN
175.0000 mg | Freq: Once | INTRAMUSCULAR | Status: AC
  Administered 2016-11-14: 175 mg via INTRAMUSCULAR

## 2016-11-14 NOTE — Progress Notes (Signed)
Patient came into office today for testosterone injection. Denies chest pain, shortness of breath, headaches and problems associated with taking this medication. Patient states he has had no abnornal mood swings. Patient tolerated injection in [place] well without complications. Patient advised to schedule his next injection for 2 weeks from today. 

## 2016-11-18 ENCOUNTER — Ambulatory Visit

## 2016-11-21 ENCOUNTER — Ambulatory Visit

## 2016-11-28 ENCOUNTER — Ambulatory Visit (INDEPENDENT_AMBULATORY_CARE_PROVIDER_SITE_OTHER): Admitting: Physician Assistant

## 2016-11-28 VITALS — BP 104/71 | HR 86 | Resp 18 | Wt 230.0 lb

## 2016-11-28 DIAGNOSIS — E291 Testicular hypofunction: Secondary | ICD-10-CM | POA: Diagnosis not present

## 2016-11-28 MED ORDER — TESTOSTERONE CYPIONATE 200 MG/ML IM SOLN
200.0000 mg | Freq: Once | INTRAMUSCULAR | Status: AC
  Administered 2016-11-28: 200 mg via INTRAMUSCULAR

## 2016-11-28 NOTE — Progress Notes (Signed)
Patient came into office today for testosterone injection. Denies chest pain, shortness of breath, headaches and problems associated with taking this medication. Patient states he has had no abnornal mood swings. Patient tolerated injection in [place] well without complications. Patient advised to schedule his next injection for 2 weeks from today. 

## 2016-12-01 ENCOUNTER — Ambulatory Visit

## 2016-12-02 ENCOUNTER — Ambulatory Visit

## 2016-12-12 ENCOUNTER — Encounter: Payer: Self-pay | Admitting: Physician Assistant

## 2016-12-12 ENCOUNTER — Ambulatory Visit (INDEPENDENT_AMBULATORY_CARE_PROVIDER_SITE_OTHER): Admitting: Physician Assistant

## 2016-12-12 VITALS — BP 125/101 | HR 86 | Wt 235.0 lb

## 2016-12-12 DIAGNOSIS — E291 Testicular hypofunction: Secondary | ICD-10-CM

## 2016-12-12 MED ORDER — TESTOSTERONE CYPIONATE 200 MG/ML IM SOLN
200.0000 mg | Freq: Once | INTRAMUSCULAR | Status: AC
  Administered 2016-12-12: 200 mg via INTRAMUSCULAR

## 2016-12-12 NOTE — Progress Notes (Signed)
   Subjective:    Patient ID: John Sharp, male    DOB: [birth year], 56 y.o.   MRN: [id number]  HPI Pt is here for a testosterone injection. Denies chest pain, shortness of breath, or problems with medications.    Review of Systems  Respiratory: Negative for shortness of breath.   Cardiovascular: Negative for chest pain.       Objective:   Physical Exam        Assessment & Plan:  Pt tolerated injection well without complications. Pt advised to schedule next injection in 14 days.

## 2016-12-26 ENCOUNTER — Ambulatory Visit (INDEPENDENT_AMBULATORY_CARE_PROVIDER_SITE_OTHER): Admitting: Physician Assistant

## 2016-12-26 VITALS — BP 127/85 | HR 77 | Ht 72.0 in | Wt 232.1 lb

## 2016-12-26 DIAGNOSIS — E291 Testicular hypofunction: Secondary | ICD-10-CM | POA: Diagnosis not present

## 2016-12-26 MED ORDER — TESTOSTERONE CYPIONATE 200 MG/ML IM SOLN
175.0000 mg | Freq: Once | INTRAMUSCULAR | Status: AC
  Administered 2016-12-26: 175 mg via INTRAMUSCULAR

## 2016-12-26 NOTE — Progress Notes (Signed)
   Subjective:    Patient ID: John Sharp, male    DOB: 09-Nov-1961, 56 y.o.   MRN: [id number]  HPI Pt is here for testosterone Injection. Denies chest pain, shortness of breath, headaches and problems with medication or mood changes.   Review of Systems  Respiratory: Negative for shortness of breath.   Cardiovascular: Negative for chest pain.  Neurological: Negative for headaches.       Objective:   Physical Exam        Assessment & Plan:  Pt tolerated injection well in [place] without complications. Pt advised to schedule next injection in 14 days.

## 2016-12-28 ENCOUNTER — Other Ambulatory Visit: Payer: Self-pay | Admitting: Physician Assistant

## 2016-12-28 DIAGNOSIS — G250 Essential tremor: Secondary | ICD-10-CM

## 2017-01-09 ENCOUNTER — Ambulatory Visit (INDEPENDENT_AMBULATORY_CARE_PROVIDER_SITE_OTHER): Admitting: Physician Assistant

## 2017-01-09 VITALS — BP 114/81 | HR 82 | Wt 243.0 lb

## 2017-01-09 DIAGNOSIS — E291 Testicular hypofunction: Secondary | ICD-10-CM | POA: Diagnosis not present

## 2017-01-09 MED ORDER — TESTOSTERONE CYPIONATE 200 MG/ML IM SOLN
175.0000 mg | Freq: Once | INTRAMUSCULAR | Status: AC
  Administered 2017-01-09: 175 mg via INTRAMUSCULAR

## 2017-01-09 NOTE — Progress Notes (Signed)
Pt is here for a testosterone injection.  Denies CP, SOB, HA, and problems with medication or mood changes.   Pt tolerated the injection well without complications in [place].  Pt advised to make an appointment in 14 days for next injection.

## 2017-01-22 ENCOUNTER — Other Ambulatory Visit: Payer: Self-pay | Admitting: Physician Assistant

## 2017-01-23 ENCOUNTER — Ambulatory Visit (INDEPENDENT_AMBULATORY_CARE_PROVIDER_SITE_OTHER): Admitting: Physician Assistant

## 2017-01-23 VITALS — BP 130/82 | HR 79 | Wt 230.0 lb

## 2017-01-23 DIAGNOSIS — E291 Testicular hypofunction: Secondary | ICD-10-CM

## 2017-01-23 MED ORDER — TESTOSTERONE CYPIONATE 200 MG/ML IM SOLN
175.0000 mg | Freq: Once | INTRAMUSCULAR | Status: AC
  Administered 2017-01-23: 175 mg via INTRAMUSCULAR

## 2017-01-23 NOTE — Progress Notes (Signed)
Pt is here for testosterone injection.  Denies chest pain, SOB, headaches, or problems with medication or mood changes.  Patient tolerated injection well and without complications in [place].  Dosage verified by K. Marvel Plan, LPN.  Pt advised to schedule appointment in 14 days.

## 2017-02-05 ENCOUNTER — Ambulatory Visit (INDEPENDENT_AMBULATORY_CARE_PROVIDER_SITE_OTHER): Admitting: Physician Assistant

## 2017-02-05 VITALS — BP 126/86 | HR 78 | Resp 18

## 2017-02-05 DIAGNOSIS — E291 Testicular hypofunction: Secondary | ICD-10-CM | POA: Diagnosis not present

## 2017-02-05 MED ORDER — TESTOSTERONE CYPIONATE 200 MG/ML IM SOLN
175.0000 mg | Freq: Once | INTRAMUSCULAR | Status: AC
  Administered 2017-02-05: 175 mg via INTRAMUSCULAR

## 2017-02-05 NOTE — Progress Notes (Signed)
   Subjective:    Patient ID: John Sharp, male    DOB: 07-30-1961, 56 y.o.   MRN: [id number]  HPI Patient here for testosterone injection. Denies any medication related problems with chest pain, shortness of breath, headaches or mood changes.   Review of Systems     Objective:   Physical Exam        Assessment & Plan:  Patient tolerated injection well without complications. Advised patient to schedule next injection in 2 weeks.

## 2017-02-09 ENCOUNTER — Other Ambulatory Visit: Payer: Self-pay | Admitting: Physician Assistant

## 2017-02-20 ENCOUNTER — Ambulatory Visit (INDEPENDENT_AMBULATORY_CARE_PROVIDER_SITE_OTHER): Admitting: Family Medicine

## 2017-02-20 VITALS — BP 128/80 | HR 83

## 2017-02-20 DIAGNOSIS — E291 Testicular hypofunction: Secondary | ICD-10-CM

## 2017-02-20 MED ORDER — TESTOSTERONE CYPIONATE 200 MG/ML IM SOLN
175.0000 mg | Freq: Once | INTRAMUSCULAR | Status: AC
  Administered 2017-02-20: 175 mg via INTRAMUSCULAR

## 2017-02-20 NOTE — Progress Notes (Signed)
Patient came into office today for testosterone injection. Denies chest pain, shortness of breath, headaches and problems associated with taking this medication. Patient states he has had no abnornal mood swings. Patient tolerated injection in [place] well without complications. Patient advised to schedule his next injection for 2 weeks from today. 

## 2017-02-20 NOTE — Progress Notes (Signed)
Lab orders placed. Will give to Pt at upcoming 2 week appt.

## 2017-02-20 NOTE — Progress Notes (Signed)
Needs repeat PSA, CBC and test. He is due.

## 2017-02-20 NOTE — Addendum Note (Signed)
Addended by: Huel Cote on: 02/20/2017 03:35 PM   Modules accepted: Orders

## 2017-03-06 ENCOUNTER — Ambulatory Visit (INDEPENDENT_AMBULATORY_CARE_PROVIDER_SITE_OTHER): Admitting: Physician Assistant

## 2017-03-06 VITALS — BP 97/68 | HR 96

## 2017-03-06 DIAGNOSIS — E291 Testicular hypofunction: Secondary | ICD-10-CM

## 2017-03-06 MED ORDER — TESTOSTERONE CYPIONATE 200 MG/ML IM SOLN
175.0000 mg | Freq: Once | INTRAMUSCULAR | Status: AC
  Administered 2017-03-06: 175 mg via INTRAMUSCULAR

## 2017-03-06 MED ORDER — TESTOSTERONE CYPIONATE 200 MG/ML IM SOLN
200.0000 mg | Freq: Once | INTRAMUSCULAR | Status: DC
Start: 2017-03-06 — End: 2017-03-06

## 2017-03-06 NOTE — Progress Notes (Signed)
John Sharp presents to the clinic for a testosterone injection. Denies SOB, chest pain, headache, and mood changes. Pt tolerated injection in [place] well without complications.  Pt instructed to get an 8 am lab draw done before his next appointment.  Pt advised to make next appointment in 14 days. -EMH/RMA  I agree with the above plan. Iran Planas PA-C

## 2017-03-16 LAB — CBC WITH DIFFERENTIAL/PLATELET
Basophils Absolute: 57 cells/uL (ref 0–200)
Basophils Relative: 1 %
EOS PCT: 1 %
Eosinophils Absolute: 57 cells/uL (ref 15–500)
HEMATOCRIT: 48.7 % (ref 38.5–50.0)
Hemoglobin: 16.2 g/dL (ref 13.2–17.1)
LYMPHS PCT: 29 %
Lymphs Abs: 1653 cells/uL (ref 850–3900)
MCH: 30.2 pg (ref 27.0–33.0)
MCHC: 33.3 g/dL (ref 32.0–36.0)
MCV: 90.9 fL (ref 80.0–100.0)
MONO ABS: 456 {cells}/uL (ref 200–950)
MONOS PCT: 8 %
MPV: 11.3 fL (ref 7.5–12.5)
NEUTROS PCT: 61 %
Neutro Abs: 3477 cells/uL (ref 1500–7800)
PLATELETS: 198 10*3/uL (ref 140–400)
RBC: 5.36 MIL/uL (ref 4.20–5.80)
RDW: 14.1 % (ref 11.0–15.0)
WBC: 5.7 10*3/uL (ref 3.8–10.8)

## 2017-03-16 LAB — PSA: PSA: 3.3 ng/mL (ref <=4.0)

## 2017-03-17 LAB — TESTOSTERONE: Testosterone: 408 ng/dL (ref 250–827)

## 2017-03-17 NOTE — Progress Notes (Signed)
Call pt: labs look good.

## 2017-03-18 ENCOUNTER — Ambulatory Visit (INDEPENDENT_AMBULATORY_CARE_PROVIDER_SITE_OTHER): Admitting: Physician Assistant

## 2017-03-18 VITALS — BP 141/86 | HR 90 | Wt 232.0 lb

## 2017-03-18 DIAGNOSIS — E291 Testicular hypofunction: Secondary | ICD-10-CM | POA: Diagnosis not present

## 2017-03-18 MED ORDER — TESTOSTERONE CYPIONATE 200 MG/ML IM SOLN
200.0000 mg | INTRAMUSCULAR | Status: DC
  Administered 2017-03-18: 200 mg via INTRAMUSCULAR

## 2017-03-18 NOTE — Progress Notes (Signed)
Patient was in office for Testosterone injection. Patient denied any dizziness, headaches or Shortness of breath. . 200 ml of Depo testosterone given LUOQ. Rhonda Cunningham,CMA  Agree with above plan. Iran Planas PA-C

## 2017-03-20 ENCOUNTER — Ambulatory Visit

## 2017-04-03 ENCOUNTER — Ambulatory Visit (INDEPENDENT_AMBULATORY_CARE_PROVIDER_SITE_OTHER): Admitting: Physician Assistant

## 2017-04-03 VITALS — BP 121/80 | HR 84 | Wt 240.0 lb

## 2017-04-03 DIAGNOSIS — E291 Testicular hypofunction: Secondary | ICD-10-CM

## 2017-04-03 MED ORDER — TESTOSTERONE CYPIONATE 200 MG/ML IM SOLN
175.0000 mg | Freq: Once | INTRAMUSCULAR | Status: AC
  Administered 2017-04-03: 175 mg via INTRAMUSCULAR

## 2017-04-03 NOTE — Progress Notes (Signed)
Pt is here for testosterone injection.  Denies chest pain, shortness of breath, headaches and problems with medications or mood changes.  Patient tolerated injection well and without complications. Injection given in RUOQ.  Pt instructed to return in 14 days for next injection.  Agree with above plan. Iran Planas PA-C

## 2017-04-07 ENCOUNTER — Ambulatory Visit

## 2017-04-17 ENCOUNTER — Ambulatory Visit (INDEPENDENT_AMBULATORY_CARE_PROVIDER_SITE_OTHER): Admitting: Physician Assistant

## 2017-04-17 VITALS — BP 111/97 | HR 83

## 2017-04-17 DIAGNOSIS — E291 Testicular hypofunction: Secondary | ICD-10-CM

## 2017-04-17 MED ORDER — TESTOSTERONE CYPIONATE 200 MG/ML IM SOLN
175.0000 mg | Freq: Once | INTRAMUSCULAR | Status: AC
  Administered 2017-04-17: 175 mg via INTRAMUSCULAR

## 2017-04-17 NOTE — Progress Notes (Signed)
   Subjective:    Patient ID: John Sharp, male    DOB: 06-Apr-1961, 56 y.o.   MRN: [id number]  HPI Patient is in today for a testosterone injection. Denies any notable complications since last injection.    Review of Systems     Objective:   Physical Exam        Assessment & Plan:  Pt tolerated injection in the LUOQ without complications. Advised to return in 2 weeks for testosterone injection.  Agree with the above plan. Iran Planas PA-C

## 2017-05-01 ENCOUNTER — Ambulatory Visit (INDEPENDENT_AMBULATORY_CARE_PROVIDER_SITE_OTHER): Admitting: Sports Medicine

## 2017-05-01 VITALS — BP 136/80 | HR 74

## 2017-05-01 DIAGNOSIS — E291 Testicular hypofunction: Secondary | ICD-10-CM | POA: Diagnosis not present

## 2017-05-01 MED ORDER — TESTOSTERONE CYPIONATE 200 MG/ML IM SOLN
175.0000 mg | Freq: Once | INTRAMUSCULAR | Status: AC
  Administered 2017-05-01: 175 mg via INTRAMUSCULAR

## 2017-05-01 NOTE — Progress Notes (Signed)
Patient came into office today for testosterone injection. Denies chest pain, shortness of breath, headaches and problems associated with taking this medication. Patient states he has had no abnornal mood swings. Patient tolerated injection in [place] well without complications. Patient advised to schedule his next injection for 2 weeks from today. 

## 2017-05-18 ENCOUNTER — Ambulatory Visit

## 2017-05-20 ENCOUNTER — Ambulatory Visit (INDEPENDENT_AMBULATORY_CARE_PROVIDER_SITE_OTHER): Admitting: Osteopathic Medicine

## 2017-05-20 VITALS — BP 139/97 | HR 90

## 2017-05-20 DIAGNOSIS — E291 Testicular hypofunction: Secondary | ICD-10-CM | POA: Diagnosis not present

## 2017-05-20 MED ORDER — TESTOSTERONE CYPIONATE 200 MG/ML IM SOLN
175.0000 mg | Freq: Once | INTRAMUSCULAR | Status: AC
  Administered 2017-05-20: 175 mg via INTRAMUSCULAR

## 2017-05-20 NOTE — Progress Notes (Signed)
Pt here for testosterone injection no SOB,CP or mood swings. Injection tolerated well given on LUOQ. Pt will RTC in 2 wks for next injection.John Sharp

## 2017-05-22 ENCOUNTER — Other Ambulatory Visit: Payer: Self-pay | Admitting: Physician Assistant

## 2017-05-29 ENCOUNTER — Ambulatory Visit

## 2017-06-01 ENCOUNTER — Ambulatory Visit (INDEPENDENT_AMBULATORY_CARE_PROVIDER_SITE_OTHER): Admitting: Physician Assistant

## 2017-06-01 VITALS — BP 113/71 | HR 96 | Resp 16 | Wt 230.0 lb

## 2017-06-01 DIAGNOSIS — E291 Testicular hypofunction: Secondary | ICD-10-CM

## 2017-06-01 MED ORDER — TESTOSTERONE CYPIONATE 200 MG/ML IM SOLN
175.0000 mg | Freq: Once | INTRAMUSCULAR | Status: AC
  Administered 2017-06-01: 175 mg via INTRAMUSCULAR

## 2017-06-01 NOTE — Progress Notes (Signed)
Patient is here for testosterone injection. Denies chest pain, SOB, headaches and problems with medication or mood changes. Pt tolerated injection into RUOQ well without complications advised to schedule next injection in 2 weeks.

## 2017-06-16 ENCOUNTER — Ambulatory Visit (INDEPENDENT_AMBULATORY_CARE_PROVIDER_SITE_OTHER): Admitting: Sports Medicine

## 2017-06-16 DIAGNOSIS — N139 Obstructive and reflux uropathy, unspecified: Secondary | ICD-10-CM | POA: Diagnosis not present

## 2017-06-16 DIAGNOSIS — M7989 Other specified soft tissue disorders: Secondary | ICD-10-CM | POA: Insufficient documentation

## 2017-06-16 DIAGNOSIS — M7021 Olecranon bursitis, right elbow: Secondary | ICD-10-CM

## 2017-06-16 DIAGNOSIS — N529 Male erectile dysfunction, unspecified: Secondary | ICD-10-CM | POA: Diagnosis not present

## 2017-06-16 DIAGNOSIS — M7062 Trochanteric bursitis, left hip: Secondary | ICD-10-CM | POA: Diagnosis not present

## 2017-06-16 DIAGNOSIS — E291 Testicular hypofunction: Secondary | ICD-10-CM

## 2017-06-16 MED ORDER — TAMSULOSIN HCL 0.4 MG PO CAPS: 0.4000 mg | ORAL_CAPSULE | Freq: Every day | ORAL | 3 refills | Status: DC

## 2017-06-16 NOTE — Assessment & Plan Note (Signed)
Rehabilitation exercises given, injection if no better.

## 2017-06-16 NOTE — Assessment & Plan Note (Signed)
Starting Flomax. Return in 2 weeks to evaluate response

## 2017-06-16 NOTE — Assessment & Plan Note (Signed)
Testosterone levels are normal, no changes needed. We did explain that testosterone was not associated with erectile dysfunction.

## 2017-06-16 NOTE — Assessment & Plan Note (Signed)
Aspiration and injection, fluid sent off for cultures, crystal analysis, cell counts. Strapped with compressive dressing.

## 2017-06-16 NOTE — Progress Notes (Signed)
Subjective:    CC: Multiple issues  HPI: Right elbow swelling: Somewhat erythematous, painful. Localized over the olecranon process. No trauma, no constitutional symptoms.  Left hip pain: Localized laterally over the greater trochanter, has been exercising more lately. No radiation. Painful to lay on the lateral side.  Difficulty urinating: Endorses weak stream, dribbling, frequency, nocturia. No burning, no constitutional symptoms. Symptoms have been slowly progressive.  Erectile dysfunction: Would like a referral to urology, has tried three, 5 phosphodiesterase inhibitors, penile compressive ring, without improvement. Understands that the next step is likely intracavernosal injections. His testosterone levels have been normal but we did discuss the fact that testosterone supplementation was typically not useful in improving erectile function.  Past medical history:  Negative.  See flowsheet/record as well for more information.  Surgical history: Negative.  See flowsheet/record as well for more information.  Family history: Negative.  See flowsheet/record as well for more information.  Social history: Negative.  See flowsheet/record as well for more information.  Allergies, and medications have been entered into the medical record, reviewed, and no changes needed.   Review of Systems: No fevers, chills, night sweats, weight loss, chest pain, or shortness of breath.   Objective:    General: Well Developed, well nourished, and in no acute distress.  Neuro: Alert and oriented x3, extra-ocular muscles intact, sensation grossly intact.  HEENT: Normocephalic, atraumatic, pupils equal round reactive to light, neck supple, no masses, no lymphadenopathy, thyroid nonpalpable.  Skin: Warm and dry, no rashes. Cardiac: Regular rate and rhythm, no murmurs rubs or gallops, no lower extremity edema.  Respiratory: Clear to auscultation bilaterally. Not using accessory muscles, speaking in full  sentences. Right Elbow: Visibly swollen, warm and tender olecranon bursa Range of motion full pronation, supination, flexion, extension. Strength is full to all of the above directions Stable to varus, valgus stress. Negative moving valgus stress test. No discrete areas of tenderness to palpation. Ulnar nerve does not sublux. Negative cubital tunnel Tinel's. Left Hip: ROM IR: 60 Deg, ER: 60 Deg, Flexion: 120 Deg, Extension: 100 Deg, Abduction: 45 Deg, Adduction: 45 Deg Strength IR: 5/5, ER: 5/5, Flexion: 5/5, Extension: 5/5, Abduction: 5/5, Adduction: 5/5 Pelvic alignment unremarkable to inspection and palpation. Standing hip rotation and gait without trendelenburg / unsteadiness. Greater trochanter with mild tenderness to palpation, he did have relatively weak hip abductors on the left side compared to the right. No tenderness over piriformis. No SI joint tenderness and normal minimal SI movement.  Procedure: Real-time Ultrasound Guided aspiration/injection of right olecranon bursa  Device: GE Logiq E  Verbal informed consent obtained.  Time-out conducted.  Noted no overlying erythema, induration, or other signs of local infection.  Skin prepped in a sterile fashion.  Local anesthesia: Topical Ethyl chloride.  With sterile technique and under real time ultrasound guidance:  Aspirated 2-3 mL of clear, straw-colored fluid, syringe switched and 1 mL kenalog 40, 1 mL lidocaine injected easily. Completed without difficulty  Pain immediately resolved suggesting accurate placement of the medication.  Advised to call if fevers/chills, erythema, induration, drainage, or persistent bleeding.  Images permanently stored and available for review in the ultrasound unit.  Impression: Technically successful ultrasound guided injection.  The elbow was then strapped with compressive dressing.  Impression and Recommendations:    Olecranon bursitis, right elbow Aspiration and injection, fluid  sent off for cultures, crystal analysis, cell counts. Strapped with compressive dressing.  Trochanteric bursitis, left hip Rehabilitation exercises given, injection if no better.  Erectile dysfunction Has tried several  of the 5 phosphodiesterase inhibitors, Viagra, Cialis, Levitra without any improvement. Has tried penile compressive ring without improvement. At this point I think he is a candidate for intracavernosal prostaglandin injections.  Referral to urology.   Obstructive uropathy Starting Flomax. Return in 2 weeks to evaluate response  Male hypogonadism Testosterone levels are normal, no changes needed. We did explain that testosterone was not associated with erectile dysfunction.  I spent 40 minutes with this patient, greater than 50% was face-to-face time counseling regarding the above diagnoses, this was separate from the time spent performing the above procedure

## 2017-06-16 NOTE — Assessment & Plan Note (Signed)
Has tried several of the 5 phosphodiesterase inhibitors, Viagra, Cialis, Levitra without any improvement. Has tried penile compressive ring without improvement. At this point I think he is a candidate for intracavernosal prostaglandin injections.  Referral to urology.

## 2017-06-17 LAB — SYNOVIAL CELL COUNT + DIFF, W/ CRYSTALS
Basophils, %: 0 %
Eosinophils-Synovial: 1 % (ref 0–2)
Lymphocytes-Synovial Fld: 13 % (ref 0–74)
Monocyte/Macrophage: 42 % (ref 0–69)
Neutrophil, Synovial: 44 % — ABNORMAL HIGH (ref 0–24)
Synoviocytes, %: 0 % (ref 0–15)
WBC, Synovial: 1255 cells/uL — ABNORMAL HIGH (ref <150)

## 2017-06-19 ENCOUNTER — Ambulatory Visit

## 2017-06-21 LAB — BODY FLUID CULTURE
Gram Stain: NONE SEEN
Organism ID, Bacteria: NO GROWTH

## 2017-06-22 ENCOUNTER — Encounter: Payer: Self-pay | Admitting: Sports Medicine

## 2017-06-22 ENCOUNTER — Ambulatory Visit (INDEPENDENT_AMBULATORY_CARE_PROVIDER_SITE_OTHER): Admitting: Sports Medicine

## 2017-06-22 DIAGNOSIS — M7021 Olecranon bursitis, right elbow: Secondary | ICD-10-CM

## 2017-06-22 MED ORDER — CEFTRIAXONE SODIUM 1 G IJ SOLR
1.0000 g | Freq: Once | INTRAMUSCULAR | Status: AC
  Administered 2017-06-22: 1 g via INTRAMUSCULAR

## 2017-06-22 MED ORDER — DOXYCYCLINE HYCLATE 100 MG PO TABS: 100.0000 mg | ORAL_TABLET | Freq: Two times a day (BID) | ORAL | 0 refills | Status: AC

## 2017-06-22 MED ORDER — DOXYCYCLINE HYCLATE 100 MG PO TABS: 100.0000 mg | ORAL_TABLET | Freq: Two times a day (BID) | ORAL | 0 refills | Status: DC

## 2017-06-22 NOTE — Progress Notes (Signed)
  Subjective:    CC: Right elbow  HPI: I saw John Sharp last week, we aspirated and injected a right olecranon bursitis, cell counts have been unremarkable, crystal analysis was negative, cultures are negative. He did well through the day until he banged it on several other things. He is now having a recurrence of pain, with warmth, swelling. No constitutional symptoms, moderate, worsening.  Past medical history:  Negative.  See flowsheet/record as well for more information.  Surgical history: Negative.  See flowsheet/record as well for more information.  Family history: Negative.  See flowsheet/record as well for more information.  Social history: Negative.  See flowsheet/record as well for more information.  Allergies, and medications have been entered into the medical record, reviewed, and no changes needed.   Review of Systems: No fevers, chills, night sweats, weight loss, chest pain, or shortness of breath.   Objective:    General: Well Developed, well nourished, and in no acute distress.  Neuro: Alert and oriented x3, extra-ocular muscles intact, sensation grossly intact.  HEENT: Normocephalic, atraumatic, pupils equal round reactive to light, neck supple, no masses, no lymphadenopathy, thyroid nonpalpable.  Skin: Warm and dry, no rashes. Cardiac: Regular rate and rhythm, no murmurs rubs or gallops, no lower extremity edema.  Respiratory: Clear to auscultation bilaterally. Not using accessory muscles, speaking in full sentences. Right Elbow: Recurrence of a right olecranon bursitis, somewhat warm to palpation and a bit fluctuant. No overlying erythema. Range of motion full pronation, supination, flexion, extension. Strength is full to all of the above directions Stable to varus, valgus stress. Negative moving valgus stress test. No discrete areas of tenderness to palpation. Ulnar nerve does not sublux. Negative cubital tunnel Tinel's.  Rocephin 1 g intramuscular  Impression and  Recommendations:    Olecranon bursitis, right elbow Cell counts, crystal analysis, cultures all negative. I'm going to treat this empirically as an infection, Rocephin 1 g, doxycycline. Return in 2 weeks.  I spent 25 minutes with this patient, greater than 50% was face-to-face time counseling regarding the above diagnoses

## 2017-06-22 NOTE — Assessment & Plan Note (Signed)
Cell counts, crystal analysis, cultures all negative. I'm going to treat this empirically as an infection, Rocephin 1 g, doxycycline. Return in 2 weeks.

## 2017-07-02 ENCOUNTER — Ambulatory Visit (INDEPENDENT_AMBULATORY_CARE_PROVIDER_SITE_OTHER): Admitting: Sports Medicine

## 2017-07-02 VITALS — BP 125/67 | HR 79 | Resp 18 | Wt 208.0 lb

## 2017-07-02 DIAGNOSIS — N529 Male erectile dysfunction, unspecified: Secondary | ICD-10-CM

## 2017-07-02 DIAGNOSIS — E291 Testicular hypofunction: Secondary | ICD-10-CM | POA: Diagnosis not present

## 2017-07-02 DIAGNOSIS — M7021 Olecranon bursitis, right elbow: Secondary | ICD-10-CM | POA: Diagnosis not present

## 2017-07-02 DIAGNOSIS — N139 Obstructive and reflux uropathy, unspecified: Secondary | ICD-10-CM

## 2017-07-02 DIAGNOSIS — G4733 Obstructive sleep apnea (adult) (pediatric): Secondary | ICD-10-CM | POA: Diagnosis not present

## 2017-07-02 MED ORDER — TESTOSTERONE CYPIONATE 200 MG/ML IM SOLN: 175.0000 mg | Freq: Once | INTRAMUSCULAR | Status: DC

## 2017-07-02 NOTE — Progress Notes (Signed)
Subjective:    CC: Follow-up multiple issues  HPI: Erectile dysfunction: Has tried multiple 5 phosphodiesterase inhibitors without efficacy, Cialis, Levitra, Viagra. Has tried penis rings without success. At this point he is a candidate for intracavernosal prostaglandins injections.  Male hypogonadism: Has been on testosterone supplementation for years, ultimately when looking back he tells me that he's never noted an improvement in his energy level, he has always wanted to have sex, but his lack of drive to have sex is not based on desire but more fear of lack of performance. He is agreeable to discontinue this.  Obstructive sleep apnea: Has been minimally compliant with his CPAP machine, is continuing to wake up with excessive daytime fatigue and sleepiness.  Right olecranon bursitis: Essentially resolved now after aspiration, injection and doxycycline, cultures were negative.  Past medical history:  Negative.  See flowsheet/record as well for more information.  Surgical history: Negative.  See flowsheet/record as well for more information.  Family history: Negative.  See flowsheet/record as well for more information.  Social history: Negative.  See flowsheet/record as well for more information.  Allergies, and medications have been entered into the medical record, reviewed, and no changes needed.   Review of Systems: No fevers, chills, night sweats, weight loss, chest pain, or shortness of breath.   Objective:    General: Well Developed, well nourished, and in no acute distress.  Neuro: Alert and oriented x3, extra-ocular muscles intact, sensation grossly intact.  HEENT: Normocephalic, atraumatic, pupils equal round reactive to light, neck supple, no masses, no lymphadenopathy, thyroid nonpalpable.  Skin: Warm and dry, no rashes. Cardiac: Regular rate and rhythm, no murmurs rubs or gallops, no lower extremity edema.  Respiratory: Clear to auscultation bilaterally. Not using accessory  muscles, speaking in full sentences. Right Elbow: Unremarkable to inspection. Range of motion full pronation, supination, flexion, extension. Strength is full to all of the above directions Stable to varus, valgus stress. Negative moving valgus stress test. No discrete areas of tenderness to palpation. Ulnar nerve does not sublux. Negative cubital tunnel Tinel's.  Impression and Recommendations:    Erectile dysfunction Has tried several of the 5 phosphodiesterase inhibitors, Viagra, Cialis, Levitra without any improvement. Has tried a penile compressive ring without much improvement. I think at this point he is a candidate for intracavernosal prostaglandins injections. I would like him to touch base with one of our urologists here in [place].  Male hypogonadism I think at this point we should discontinue all testosterone supplementation. He has not noted any improvement in his sex drive, in fact he has plenty of sex drive and had plenty of sex drive before his testosterone supplementation but his lack of achieving quality erection created a fear of poor performance. He also has noted some worsening of his sleep apnea and excessive daytime sleepiness, because testosterone worsens sleep apnea this is another reason to discontinue. He's also had some fatigue in the past which she tells me has not gotten any better with testosterone supplementation, most recent testosterone levels are normal. Last but not least, he is developing some obstructive uropathy type symptoms, this is improved with Flomax but this is yet another reason to discontinue testosterone.   Obstructive uropathy Slight improvement with Flomax, I do think this will improve further as his prostate shrinks with discontinuation of testosterone.  Olecranon bursitis, right elbow Resolved after aspiration, injection, and antibiotics. Cell counts, cultures were negative.  Moderate obstructive sleep apnea Noncompliant with  CPAP, I think this is the major cause of  his fatigue. There is likely some element of depression. He will get more compliant, and discontinuation of testosterone should help.  I spent 40 minutes with this patient, greater than 50% was face-to-face time counseling regarding the above diagnoses, I spent a great time discussing the etiology of low sex drive, and erectile dysfunction and the limited applications of testosterone supplementation.

## 2017-07-02 NOTE — Assessment & Plan Note (Signed)
Has tried several of the 5 phosphodiesterase inhibitors, Viagra, Cialis, Levitra without any improvement. Has tried a penile compressive ring without much improvement. I think at this point he is a candidate for intracavernosal prostaglandins injections. I would like him to touch base with one of our urologists here in [place].

## 2017-07-02 NOTE — Assessment & Plan Note (Signed)
I think at this point we should discontinue all testosterone supplementation. He has not noted any improvement in his sex drive, in fact he has plenty of sex drive and had plenty of sex drive before his testosterone supplementation but his lack of achieving quality erection created a fear of poor performance. He also has noted some worsening of his sleep apnea and excessive daytime sleepiness, because testosterone worsens sleep apnea this is another reason to discontinue. He's also had some fatigue in the past which she tells me has not gotten any better with testosterone supplementation, most recent testosterone levels are normal. Last but not least, he is developing some obstructive uropathy type symptoms, this is improved with Flomax but this is yet another reason to discontinue testosterone.

## 2017-07-02 NOTE — Assessment & Plan Note (Signed)
Slight improvement with Flomax, I do think this will improve further as his prostate shrinks with discontinuation of testosterone.

## 2017-07-02 NOTE — Assessment & Plan Note (Signed)
Resolved after aspiration, injection, and antibiotics. Cell counts, cultures were negative.

## 2017-07-02 NOTE — Assessment & Plan Note (Signed)
Noncompliant with CPAP, I think this is the major cause of his fatigue. There is likely some element of depression. He will get more compliant, and discontinuation of testosterone should help.

## 2017-07-06 ENCOUNTER — Telehealth: Payer: Self-pay | Admitting: Sports Medicine

## 2017-07-06 MED ORDER — DOXYCYCLINE HYCLATE 100 MG PO TABS: 100.0000 mg | ORAL_TABLET | Freq: Two times a day (BID) | ORAL | 0 refills | Status: DC

## 2017-07-06 NOTE — Telephone Encounter (Signed)
Sure, one more week of doxycycline, he needs to be more careful because I'm not refilling this again.

## 2017-07-06 NOTE — Telephone Encounter (Signed)
Patient called left voicemail requesting to know if he can get another round of antibiotics called into his pharmacy he hit his elbow a couple times and he has bursitis. Pt adv Jade told him to keep it wrapped up but to call today to see if could get antibiotic. Please adv. Pt would like a call back to know if he can get more antibiotics called in

## 2017-07-08 ENCOUNTER — Ambulatory Visit (INDEPENDENT_AMBULATORY_CARE_PROVIDER_SITE_OTHER): Admitting: Family Medicine

## 2017-07-08 VITALS — BP 117/86 | HR 80 | Ht 72.0 in | Wt 210.0 lb

## 2017-07-08 DIAGNOSIS — L03113 Cellulitis of right upper limb: Secondary | ICD-10-CM

## 2017-07-08 MED ORDER — CEFDINIR 300 MG PO CAPS: 300.0000 mg | ORAL_CAPSULE | Freq: Two times a day (BID) | ORAL | 0 refills | Status: DC

## 2017-07-08 MED ORDER — DOXYCYCLINE HYCLATE 100 MG PO TABS: 100.0000 mg | ORAL_TABLET | Freq: Two times a day (BID) | ORAL | 0 refills | Status: DC

## 2017-07-08 NOTE — Progress Notes (Signed)
John Sharp is a 56 y.o. male who presents to [facility] today for right elbow pain and swelling. Patient is seen my partner Dr. Dianah Field several times for right elbow olecranon bursitis. He had aspiration and drainage on 06/16/2017. Cell count and crystal analysis are unremarkable. Culture was negative. He had recurrent swelling on August 13 and was treated with Rocephin and doxycycline. This worked over the swelling returned recently. He notes redness pain and swelling. He denies any fevers or chills vomiting or diarrhea.   Past Medical History:  Diagnosis Date  . Erectile dysfunction   . GERD (gastroesophageal reflux disease)    Past Surgical History:  Procedure Laterality Date  . APPENDECTOMY    . Left ankle repair  2012   Social History  Substance Use Topics  . Smoking status: Current Some Day Smoker  . Smokeless tobacco: Never Used  . Alcohol use Not on file     ROS:  As above   Medications: Current Outpatient Prescriptions  Medication Sig Dispense Refill  . AMBULATORY NON FORMULARY MEDICATION CPAP full face mask and associated equipment. 1 Device 0  . aspirin 325 MG tablet Take 325 mg by mouth daily.    . cefdinir (OMNICEF) 300 MG capsule Take 1 capsule (300 mg total) by mouth 2 (two) times daily. 14 capsule 0  . doxycycline (VIBRA-TABS) 100 MG tablet Take 1 tablet (100 mg total) by mouth 2 (two) times daily. 14 tablet 0  . fish oil-omega-3 fatty acids 1000 MG capsule Take 2 g by mouth daily.    . meloxicam (MOBIC) 15 MG tablet TAKE 1 TABLET DAILY 90 tablet 0  . montelukast (SINGULAIR) 10 MG tablet TAKE 1 TABLET AT BEDTIME 90 tablet 3  . Multiple Vitamin (MULTIVITAMIN) capsule Take 1 capsule by mouth daily.    . pantoprazole (PROTONIX) 40 MG tablet TAKE 1 TABLET TWICE A DAY 180 tablet 3  . primidone (MYSOLINE) 50 MG tablet TAKE ONE-HALF (1/2) TABLET AT BEDTIME, MAY INCREASE TO 1 FULL TABLET AT BEDTIME AFTER 7 DAYS IF  TREMORS PERSIST 90 tablet 0  . sucralfate (CARAFATE) 1 g tablet TAKE 1 TABLET FOUR TIMES A DAY WITH MEALS AND AT BEDTIME 120 tablet 1  . tamsulosin (FLOMAX) 0.4 MG CAPS capsule Take 1 capsule (0.4 mg total) by mouth daily. 30 capsule 3  . vitamin B-12 (CYANOCOBALAMIN) 250 MCG tablet Take 250 mcg by mouth daily.    . vitamin C (ASCORBIC ACID) 500 MG tablet Take 500 mg by mouth daily.     No current facility-administered medications for this visit.    No Known Allergies   Exam:  BP 117/86 (BP Location: Left Arm, Patient Position: Sitting, Cuff Size: Normal)   Pulse 80   Ht 6' (1.829 m)   Wt 210 lb (95.3 kg)   SpO2 98%   BMI 28.48 kg/m  General: Well Developed, well nourished, and in no acute distress.  Neuro/Psych: Alert and oriented x3, extra-ocular muscles intact, able to move all 4 extremities, sensation grossly intact. Skin: Warm and dry, no rashes noted.  Respiratory: Not using accessory muscles, speaking in full sentences, trachea midline.  Cardiovascular: Pulses palpable, no extremity edema. Abdomen: Does not appear distended. MSK:  Right elbow diffuse mild fluctuance. Minimal erythema not particularly tender. Motion intact.  Limited musculoskeletal ultrasound overlying the olecranon bursa reveals hypoechoic fluid in marble laying in the simultaneous tissue consistent with cellulitis. No definitive bursa sac seen. Normal bony structures present.  No results found for this or any previous visit (from the past 48 hour(s)). No results found.    Assessment and Plan: 56 y.o. male with cellulitis versus septic olecranon bursitis. I'm trending toward cellulitis as the swelling is diffuse and the appearance of the subcutaneous fluid on ultrasound is more consistent with cellulitis. I'm concerned that if I tried to aspirate a minimal bursitis through cellulitis will cause septic bursitis. Plan to treat with double therapy including doxycycline and Omnicef. Recommend patient  follow-up with Dr. Dianah Field or myself in 1-2 weeks.    No orders of the defined types were placed in this encounter.  Meds ordered this encounter  Medications  . doxycycline (VIBRA-TABS) 100 MG tablet    Sig: Take 1 tablet (100 mg total) by mouth 2 (two) times daily.    Dispense:  14 tablet    Refill:  0  . cefdinir (OMNICEF) 300 MG capsule    Sig: Take 1 capsule (300 mg total) by mouth 2 (two) times daily.    Dispense:  14 capsule    Refill:  0    Discussed warning signs or symptoms. Please see discharge instructions. Patient expresses understanding.

## 2017-07-08 NOTE — Patient Instructions (Signed)
Thank you for coming in today. Take doxycycline and omnicef twice daily.  Follow up with Dr T in 1-2 weeks.  Let me know if you worsen.   If we cannot get this settled down we will refer for surgery.    Try to avoid banging the elbow.   I recommend an elbow pad.    Cellulitis, Adult Cellulitis is a skin infection. The infected area is usually red and tender. This condition occurs most often in the arms and lower legs. The infection can travel to the muscles, blood, and underlying tissue and become serious. It is very important to get treated for this condition. What are the causes? Cellulitis is caused by bacteria. The bacteria enter through a break in the skin, such as a cut, burn, insect bite, open sore, or crack. What increases the risk? This condition is more likely to occur in people who:  Have a weak defense system (immune system).  Have open wounds on the skin such as cuts, burns, bites, and scrapes. Bacteria can enter the body through these open wounds.  Are older.  Have diabetes.  Have a type of long-lasting (chronic) liver disease (cirrhosis) or kidney disease.  Use IV drugs.  What are the signs or symptoms? Symptoms of this condition include:  Redness, streaking, or spotting on the skin.  Swollen area of the skin.  Tenderness or pain when an area of the skin is touched.  Warm skin.  Fever.  Chills.  Blisters.  How is this diagnosed? This condition is diagnosed based on a medical history and physical exam. You may also have tests, including:  Blood tests.  Lab tests.  Imaging tests.  How is this treated? Treatment for this condition may include:  Medicines, such as antibiotic medicines or antihistamines.  Supportive care, such as rest and application of cold or warm cloths (cold or warm compresses) to the skin.  Hospital care, if the condition is severe.  The infection usually gets better within 1-2 days of treatment. Follow these  instructions at home:  Take over-the-counter and prescription medicines only as told by your health care provider.  If you were prescribed an antibiotic medicine, take it as told by your health care provider. Do not stop taking the antibiotic even if you start to feel better.  Drink enough fluid to keep your urine clear or pale yellow.  Do not touch or rub the infected area.  Raise (elevate) the infected area above the level of your heart while you are sitting or lying down.  Apply warm or cold compresses to the affected area as told by your health care provider.  Keep all follow-up visits as told by your health care provider. This is important. These visits let your health care provider make sure a more serious infection is not developing. Contact a health care provider if:  You have a fever.  Your symptoms do not improve within 1-2 days of starting treatment.  Your bone or joint underneath the infected area becomes painful after the skin has healed.  Your infection returns in the same area or another area.  You notice a swollen bump in the infected area.  You develop new symptoms.  You have a general ill feeling (malaise) with muscle aches and pains. Get help right away if:  Your symptoms get worse.  You feel very sleepy.  You develop vomiting or diarrhea that persists.  You notice red streaks coming from the infected area.  Your red area gets larger  or turns dark in color. This information is not intended to replace advice given to you by your health care provider. Make sure you discuss any questions you have with your health care provider. Document Released: 08/06/2005 Document Revised: 03/06/2016 Document Reviewed: 09/05/2015 Elsevier Interactive Patient Education  2017 [employer].

## 2017-07-10 ENCOUNTER — Ambulatory Visit (INDEPENDENT_AMBULATORY_CARE_PROVIDER_SITE_OTHER): Admitting: Sports Medicine

## 2017-07-10 ENCOUNTER — Encounter: Payer: Self-pay | Admitting: Sports Medicine

## 2017-07-10 ENCOUNTER — Ambulatory Visit

## 2017-07-10 DIAGNOSIS — M7989 Other specified soft tissue disorders: Secondary | ICD-10-CM | POA: Diagnosis not present

## 2017-07-10 MED ORDER — SULFAMETHOXAZOLE-TRIMETHOPRIM 800-160 MG PO TABS: 1.0000 | ORAL_TABLET | Freq: Two times a day (BID) | ORAL | 0 refills | Status: DC

## 2017-07-10 MED ORDER — CEFTRIAXONE SODIUM POWD: 0 refills | Status: DC

## 2017-07-10 NOTE — Progress Notes (Addendum)
  Subjective:    CC: Worsening of arm swelling  HPI: This is a pleasant 56 year old male, unfortunately he has had worsening of swelling his right upper extremity. We initially treated him with an aspiration and injection for apparent traumatic olecranon bursitis, he never improved, so we added doxycycline, cultures continued to be negative. He was then added on Omnicef, but unfortunately had continued worsening, the entire right upper extremity is swollen and painful now. No constitutional symptoms, overall feels okay.  Past medical history:  Negative.  See flowsheet/record as well for more information.  Surgical history: Negative.  See flowsheet/record as well for more information.  Family history: Negative.  See flowsheet/record as well for more information.  Social history: Negative.  See flowsheet/record as well for more information.  Allergies, and medications have been entered into the medical record, reviewed, and no changes needed.   Review of Systems: No fevers, chills, night sweats, weight loss, chest pain, or shortness of breath.   Objective:    General: Well Developed, well nourished, and in no acute distress.  Neuro: Alert and oriented x3, extra-ocular muscles intact, sensation grossly intact.  HEENT: Normocephalic, atraumatic, pupils equal round reactive to light, neck supple, no masses, no lymphadenopathy, thyroid nonpalpable.  Skin: Warm and dry, no rashes. Cardiac: Regular rate and rhythm, no murmurs rubs or gallops, no lower extremity edema.  Respiratory: Clear to auscultation bilaterally. Not using accessory muscles, speaking in full sentences. Right upper extremity: There is erythema, induration and swelling from the mid upper arm down to the dorsal hand. Tenderness to palpation, he still has full range of motion of his elbow, as well as hand and wrist. Pulses are normal, sensation is normal.  A stat right upper extremity DVT ultrasound was negative for any evidence of  clots.  My LPN went into great detail explaining how to perform intramuscular injections, including quadrants of the gluteal region, as well as information on safety, and sterile technique. The patient's wife was shown how to reconstitute the ceftriaxone with lidocaine, draw it up, and inject, and did it with a satisfactory technique. A total of 1 g ceftriaxone was injected today into the left upper outer quadrant. Printed information on administration was provided.  Impression and Recommendations:    Swelling of arm Persistent swelling of the right arm, this started with a questionable olecranon bursitis. This is aspirated, injected, cultures and cell counts, Gram stain were all negative. He returned with worsening of symptoms, we started doxycycline and he received a shot of Rocephin, symptoms got worse, he saw Dr. Georgina Snell who added [facility], unfortunately swelling has continued to worsen involving the entire forearm and the distal half of the upper arm. Ultrasound is negative for right upper extremity DVT.  Because he has now failed to oral antibiotics, we are going to switch him to parenteral ceftriaxone 1 g every 12 hours intramuscular, he will continue these injections himself at home. I like to see him back early next week. I think we will continue these for a solid 7 days.  I'm also want to add Septra, he will discontinue all other antibiotics, I think we will get staphylococcal and streptococcal coverage with these 2 agents.   I spent 40 minutes with this patient, greater than 50% was face-to-face time counseling regarding the above diagnoses

## 2017-07-10 NOTE — Assessment & Plan Note (Signed)
Persistent swelling of the right arm, this started with a questionable olecranon bursitis. This is aspirated, injected, cultures and cell counts, Gram stain were all negative. He returned with worsening of symptoms, we started doxycycline and he received a shot of Rocephin, symptoms got worse, he saw Dr. Georgina Snell who added [facility], unfortunately swelling has continued to worsen involving the entire forearm and the distal half of the upper arm. Ultrasound is negative for right upper extremity DVT.  Because he has now failed to oral antibiotics, we are going to switch him to parenteral ceftriaxone 1 g every 12 hours intramuscular, he will continue these injections himself at home. I like to see him back early next week. I think we will continue these for a solid 7 days.  I'm also want to add Septra, he will discontinue all other antibiotics, I think we will get staphylococcal and streptococcal coverage with these 2 agents.

## 2017-07-16 ENCOUNTER — Ambulatory Visit (INDEPENDENT_AMBULATORY_CARE_PROVIDER_SITE_OTHER): Admitting: Sports Medicine

## 2017-07-16 ENCOUNTER — Encounter: Payer: Self-pay | Admitting: Sports Medicine

## 2017-07-16 DIAGNOSIS — M7989 Other specified soft tissue disorders: Secondary | ICD-10-CM

## 2017-07-16 NOTE — Progress Notes (Signed)
  Subjective:    CC: Follow-up  HPI: John Sharp is a pleasant 56 year old male, he has had a septic right olecranon bursitis for some time now, initially we did an aspiration and injection, the white blood cell count in the synovial fluid was only 12,000, mostly neutrophils. Cultures continued to be negative for a month. Crystal analysis was also negative for any evidence of gout or pseudogout. I started him on antibiotics when symptoms did not improve over the next few weeks, doxycycline. Unfortunately he continued to worsen after a total of 2 weeks of doxycycline. His whole arm was swollen at this point so we obtained a right upper extremity DVT ultrasound that was negative for any clots. I then switched to Septra and started a gram of Rocephin twice a day for a week which also didn't really provide any improvement. He remained nontoxic and afebrile the entire time. He returns today with persistent symptoms, swelling, pain over the olecranon bursa as well as somewhat distal and proximal to it.  Past medical history:  Negative.  See flowsheet/record as well for more information.  Surgical history: Negative.  See flowsheet/record as well for more information.  Family history: Negative.  See flowsheet/record as well for more information.  Social history: Negative.  See flowsheet/record as well for more information.  Allergies, and medications have been entered into the medical record, reviewed, and no changes needed.   Review of Systems: No fevers, chills, night sweats, weight loss, chest pain, or shortness of breath.   Objective:    General: Well Developed, well nourished, and in no acute distress.  Neuro: Alert and oriented x3, extra-ocular muscles intact, sensation grossly intact.  HEENT: Normocephalic, atraumatic, pupils equal round reactive to light, neck supple, no masses, no lymphadenopathy, thyroid nonpalpable.  Skin: Warm and dry, no rashes. Cardiac: Regular rate and rhythm, no murmurs rubs or  gallops, no lower extremity edema.  Respiratory: Clear to auscultation bilaterally. Not using accessory muscles, speaking in full sentences. Right Elbow: Visibly swollen, tender and warm over the olecranon bursa but also distally over the posterior ulna and proximally over the distal triceps. Range of motion full pronation, supination, flexion, extension. Strength is full to all of the above directions Stable to varus, valgus stress. Negative moving valgus stress test. Ulnar nerve does not sublux. Negative cubital tunnel Tinel's.  Impression and Recommendations:    Swelling of arm Unfortunately this represents a failure of aggressive outpatient treatment for olecranon bursitis. Cultures, crystal analysis were negative on the initial aspiration and injection one month ago. He has failed 2 weeks of doxycycline, Omnicef, Septra, as well as Rocephin 1 g intramuscular twice a day over the past week. DVT ultrasound was negative. At this point I think he needs inpatient treatment with advanced imaging looking for a collection such as a CT with IV contrast of the right upper extremity as well as IV antibiotics, and incision and drainage in the operating room if there is a collection. I have discussed this with the emergency provider at [facility], Dr. Howie Ill. The patient is heading over now.  I spent 25 minutes with this patient, greater than 50% was face-to-face time counseling regarding the above diagnoses ___________________________________________ Gwen Her. Dianah Field, M.D., ABFM., CAQSM. Primary Care and [place] Instructor of [employer] [facility] [employer]

## 2017-07-16 NOTE — Assessment & Plan Note (Signed)
Unfortunately this represents a failure of aggressive outpatient treatment for olecranon bursitis. Cultures, crystal analysis were negative on the initial aspiration and injection one month ago. He has failed 2 weeks of doxycycline, Omnicef, Septra, as well as Rocephin 1 g intramuscular twice a day over the past week. DVT ultrasound was negative. At this point I think he needs inpatient treatment with advanced imaging looking for a collection such as a CT with IV contrast of the right upper extremity as well as IV antibiotics, and incision and drainage in the operating room if there is a collection. I have discussed this with the emergency provider at [facility], Dr. Howie Ill. The patient is heading over now.

## 2017-07-17 ENCOUNTER — Ambulatory Visit: Admitting: Sports Medicine

## 2017-07-24 ENCOUNTER — Ambulatory Visit: Admitting: Sports Medicine

## 2017-10-08 ENCOUNTER — Other Ambulatory Visit: Payer: Self-pay | Admitting: Sports Medicine

## 2017-10-08 DIAGNOSIS — N139 Obstructive and reflux uropathy, unspecified: Secondary | ICD-10-CM

## 2017-11-06 ENCOUNTER — Other Ambulatory Visit: Payer: Self-pay | Admitting: Sports Medicine

## 2017-11-06 DIAGNOSIS — N139 Obstructive and reflux uropathy, unspecified: Secondary | ICD-10-CM

## 2017-12-05 ENCOUNTER — Other Ambulatory Visit: Payer: Self-pay | Admitting: Sports Medicine

## 2017-12-05 DIAGNOSIS — N139 Obstructive and reflux uropathy, unspecified: Secondary | ICD-10-CM

## 2018-01-03 ENCOUNTER — Other Ambulatory Visit: Payer: Self-pay | Admitting: Sports Medicine

## 2018-01-03 DIAGNOSIS — N139 Obstructive and reflux uropathy, unspecified: Secondary | ICD-10-CM

## 2018-01-08 ENCOUNTER — Other Ambulatory Visit: Payer: Self-pay | Admitting: Physician Assistant

## 2018-01-08 DIAGNOSIS — N139 Obstructive and reflux uropathy, unspecified: Secondary | ICD-10-CM

## 2018-02-12 ENCOUNTER — Other Ambulatory Visit: Payer: Self-pay | Admitting: *Deleted

## 2018-02-12 DIAGNOSIS — N139 Obstructive and reflux uropathy, unspecified: Secondary | ICD-10-CM

## 2018-02-12 MED ORDER — TAMSULOSIN HCL 0.4 MG PO CAPS: ORAL_CAPSULE | ORAL | 2 refills | Status: DC

## 2018-02-22 ENCOUNTER — Other Ambulatory Visit: Payer: Self-pay | Admitting: Physician Assistant

## 2018-03-01 ENCOUNTER — Other Ambulatory Visit: Payer: Self-pay | Admitting: Physician Assistant

## 2018-03-01 DIAGNOSIS — N139 Obstructive and reflux uropathy, unspecified: Secondary | ICD-10-CM

## 2018-05-02 ENCOUNTER — Other Ambulatory Visit: Payer: Self-pay | Admitting: Physician Assistant

## 2018-05-26 ENCOUNTER — Encounter: Admitting: Physician Assistant

## 2018-06-04 ENCOUNTER — Telehealth: Payer: Self-pay | Admitting: Family Medicine

## 2018-06-04 DIAGNOSIS — N139 Obstructive and reflux uropathy, unspecified: Secondary | ICD-10-CM

## 2018-06-04 DIAGNOSIS — K2100 Gastro-esophageal reflux disease with esophagitis, without bleeding: Secondary | ICD-10-CM

## 2018-06-04 DIAGNOSIS — R739 Hyperglycemia, unspecified: Secondary | ICD-10-CM

## 2018-06-04 DIAGNOSIS — Z125 Encounter for screening for malignant neoplasm of prostate: Secondary | ICD-10-CM

## 2018-06-04 DIAGNOSIS — E291 Testicular hypofunction: Secondary | ICD-10-CM

## 2018-06-04 DIAGNOSIS — G4733 Obstructive sleep apnea (adult) (pediatric): Secondary | ICD-10-CM

## 2018-06-04 NOTE — Telephone Encounter (Signed)
Labs printed prior to physical with Luvenia Starch (PCP) Monday

## 2018-06-05 LAB — COMPLETE METABOLIC PANEL WITH GFR
AG RATIO: 1.8 (calc) (ref 1.0–2.5)
ALKALINE PHOSPHATASE (APISO): 68 U/L (ref 40–115)
ALT: 26 U/L (ref 9–46)
AST: 26 U/L (ref 10–35)
Albumin: 4.6 g/dL (ref 3.6–5.1)
BUN: 17 mg/dL (ref 7–25)
CO2: 30 mmol/L (ref 20–32)
Calcium: 9.9 mg/dL (ref 8.6–10.3)
Chloride: 102 mmol/L (ref 98–110)
Creat: 0.99 mg/dL (ref 0.70–1.33)
GFR, EST NON AFRICAN AMERICAN: 85 mL/min/{1.73_m2} (ref >=60)
GFR, Est African American: 98 mL/min/{1.73_m2} (ref >=60)
GLOBULIN: 2.5 g/dL (ref 1.9–3.7)
Glucose, Bld: 102 mg/dL — ABNORMAL HIGH (ref 65–99)
POTASSIUM: 4.2 mmol/L (ref 3.5–5.3)
SODIUM: 140 mmol/L (ref 135–146)
Total Bilirubin: 0.7 mg/dL (ref 0.2–1.2)
Total Protein: 7.1 g/dL (ref 6.1–8.1)

## 2018-06-05 LAB — LIPID PANEL W/REFLEX DIRECT LDL
CHOLESTEROL: 187 mg/dL (ref <200)
HDL: 50 mg/dL (ref >40)
LDL Cholesterol (Calc): 111 mg/dL (calc) — ABNORMAL HIGH
Non-HDL Cholesterol (Calc): 137 mg/dL (calc) — ABNORMAL HIGH (ref <130)
Total CHOL/HDL Ratio: 3.7 (calc) (ref <5.0)
Triglycerides: 143 mg/dL (ref <150)

## 2018-06-05 LAB — CBC
HEMATOCRIT: 44.5 % (ref 38.5–50.0)
Hemoglobin: 15.3 g/dL (ref 13.2–17.1)
MCH: 31.2 pg (ref 27.0–33.0)
MCHC: 34.4 g/dL (ref 32.0–36.0)
MCV: 90.6 fL (ref 80.0–100.0)
MPV: 10.9 fL (ref 7.5–12.5)
Platelets: 210 10*3/uL (ref 140–400)
RBC: 4.91 10*6/uL (ref 4.20–5.80)
RDW: 12 % (ref 11.0–15.0)
WBC: 4.1 10*3/uL (ref 3.8–10.8)

## 2018-06-05 LAB — TESTOSTERONE: Testosterone: 208 ng/dL — ABNORMAL LOW (ref 250–827)

## 2018-06-05 LAB — HEMOGLOBIN A1C
Hgb A1c MFr Bld: 5.5 % of total Hgb (ref <5.7)
MEAN PLASMA GLUCOSE: 111 (calc)
eAG (mmol/L): 6.2 (calc)

## 2018-06-05 LAB — PSA: PSA: 1.6 ng/mL (ref <=4.0)

## 2018-06-07 ENCOUNTER — Encounter: Admitting: Physician Assistant

## 2018-06-08 NOTE — Telephone Encounter (Signed)
Call pt: CBC looks good. a1c is normal range. Kidney liver, glucose look good. Cholesterol looks good. PSA is much improved likely because not doing testosterone. Testosterone is ofcourse low since not supplementing.

## 2018-06-10 ENCOUNTER — Ambulatory Visit (INDEPENDENT_AMBULATORY_CARE_PROVIDER_SITE_OTHER): Admitting: Physician Assistant

## 2018-06-10 ENCOUNTER — Encounter: Payer: Self-pay | Admitting: Physician Assistant

## 2018-06-10 VITALS — BP 130/82 | HR 112 | Ht 72.0 in | Wt 240.0 lb

## 2018-06-10 DIAGNOSIS — K2100 Gastro-esophageal reflux disease with esophagitis, without bleeding: Secondary | ICD-10-CM

## 2018-06-10 DIAGNOSIS — J302 Other seasonal allergic rhinitis: Secondary | ICD-10-CM | POA: Diagnosis not present

## 2018-06-10 DIAGNOSIS — Z0000 Encounter for general adult medical examination without abnormal findings: Secondary | ICD-10-CM | POA: Diagnosis not present

## 2018-06-10 DIAGNOSIS — R21 Rash and other nonspecific skin eruption: Secondary | ICD-10-CM

## 2018-06-10 DIAGNOSIS — N139 Obstructive and reflux uropathy, unspecified: Secondary | ICD-10-CM | POA: Diagnosis not present

## 2018-06-10 MED ORDER — MONTELUKAST SODIUM 10 MG PO TABS: 10.0000 mg | ORAL_TABLET | Freq: Every day | ORAL | 3 refills | Status: DC

## 2018-06-10 MED ORDER — TAMSULOSIN HCL 0.4 MG PO CAPS: ORAL_CAPSULE | ORAL | 3 refills | Status: DC

## 2018-06-10 MED ORDER — CLOTRIMAZOLE-BETAMETHASONE 1-0.05 % EX CREA: 1.0000 "application " | TOPICAL_CREAM | Freq: Two times a day (BID) | CUTANEOUS | 0 refills | Status: DC

## 2018-06-10 MED ORDER — PANTOPRAZOLE SODIUM 40 MG PO TBEC: 40.0000 mg | DELAYED_RELEASE_TABLET | Freq: Every day | ORAL | 3 refills | Status: DC

## 2018-06-10 NOTE — Patient Instructions (Signed)

## 2018-06-10 NOTE — Progress Notes (Signed)
Subjective:    Patient ID: John Sharp, male    DOB: 11-23-1960, 57 y.o.   MRN: [id number]  HPI  Pt is a 57 yo male with GERD, OSA, ED, obstructive uropathy, hypogonadism who presents to the clinic for CPE.   Overall he is doing good. His urinary frequency/symptoms are much better with flomax and stopping testosterone. He has really not noticed any difference in energy or mood since stopping testosterone.   He admits he is not using CPAP.   .. Active Ambulatory Problems    Diagnosis Date Noted  . 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 GERD (gastroesophageal reflux disease) 05/13/2012  . Erectile dysfunction 05/24/2012  . Moderate obstructive sleep apnea 06/07/2012  . Depression 07/29/2013  . Medial epicondylitis of right elbow 03/10/2014  . Primary osteoarthritis of both knees 04/28/2014  . Essential tremor 09/19/2016  . Swelling of arm 06/16/2017  . Trochanteric bursitis, left hip 06/16/2017  . Obstructive uropathy 06/16/2017  . Seasonal allergies 06/10/2018   Resolved Ambulatory Problems    Diagnosis Date Noted  . Cough 01/06/2014  . Bilateral ankle pain 05/10/2014  . Elevated PSA 01/08/2015  . Increased laboratory test result 01/08/2015  . No energy 08/03/2015   Past Medical History:  Diagnosis Date  . Erectile dysfunction   . GERD (gastroesophageal reflux disease)    .Marland KitchenHistory reviewed. No pertinent family history. .. Social History   Socioeconomic History  . Marital status: Married    Spouse name: Not on file  . Number of children: Not on file  . Years of education: Not on file  . Highest education level: Not on file  Occupational History  . Not on file  Social Needs  . Financial resource strain: Not on file  . Food insecurity:    Worry: Not on file    Inability: Not on file  . Transportation needs:    Medical: Not on file    Non-medical: Not on file  Tobacco Use  . Smoking status: Current Some Day Smoker  . Smokeless tobacco: Never Used  Substance and  Sexual Activity  . Alcohol use: Not on file  . Drug use: Not on file  . Sexual activity: Not on file  Lifestyle  . Physical activity:    Days per week: Not on file    Minutes per session: Not on file  . Stress: Not on file  Relationships  . Social connections:    Talks on phone: Not on file    Gets together: Not on file    Attends religious service: Not on file    Active member of club or organization: Not on file    Attends meetings of clubs or organizations: Not on file    Relationship status: Not on file  . Intimate partner violence:    Fear of current or ex partner: Not on file    Emotionally abused: Not on file    Physically abused: Not on file    Forced sexual activity: Not on file  Other Topics Concern  . Not on file  Social History Narrative  . Not on file      Review of Systems  All other systems reviewed and are negative.      Objective:   Physical Exam BP 130/82   Pulse (!) 112   Ht 6' (1.829 m)   Wt 240 lb (108.9 kg)   BMI 32.55 kg/m   General Appearance:    Alert, cooperative, no distress, appears stated age  Head:  Normocephalic, without obvious abnormality, atraumatic  Eyes:    PERRL, conjunctiva/corneas clear, EOM's intact, fundi    benign, both eyes       Ears:    Normal TM's and external ear canals, both ears  Nose:   Nares normal, septum midline, mucosa normal, no drainage    or sinus tenderness  Throat:   Lips, mucosa, and tongue normal; teeth and gums normal  Neck:   Supple, symmetrical, trachea midline, no adenopathy;       thyroid:  No enlargement/tenderness/nodules; no carotid   bruit or JVD  Back:     Symmetric, no curvature, ROM normal, no CVA tenderness  Lungs:     Clear to auscultation bilaterally, respirations unlabored  Chest wall:    No tenderness or deformity  Heart:    Regular rate and rhythm, S1 and S2 normal, no murmur, rub   or gallop  Abdomen:     Soft, non-tender, bowel sounds active all four quadrants,    no masses,  no organomegaly        Extremities:   Extremities normal, atraumatic, no cyanosis or edema  Pulses:   2+ and symmetric all extremities  Skin:   Skin color, texture, turgor normal, no rashes or lesions  Lymph nodes:   Cervical, supraclavicular, and axillary nodes normal  Neurologic:   CNII-XII intact. Normal strength, sensation and reflexes      throughout         Assessment & Plan:  Marland KitchenMarland KitchenEwel was seen today for annual exam.  Diagnoses and all orders for this visit:  Routine physical examination  Obstructive uropathy -     tamsulosin (FLOMAX) 0.4 MG CAPS capsule; TAKE 1 CAPSULE(0.4 MG) BY MOUTH DAILY  Gastroesophageal reflux disease with esophagitis -     pantoprazole (PROTONIX) 40 MG tablet; Take 1 tablet (40 mg total) by mouth daily.  Seasonal allergies -     montelukast (SINGULAIR) 10 MG tablet; Take 1 tablet (10 mg total) by mouth at bedtime.  Rash -     clotrimazole-betamethasone (LOTRISONE) cream; Apply 1 application topically 2 (two) times daily.    Depression screen John Sharp 2/9 06/10/2018 07/29/2013  Decreased Interest 0 1  Down, Depressed, Hopeless 0 0  PHQ - 2 Score 0 1  Altered sleeping - 0  Tired, decreased energy - 1  Change in appetite - 0  Feeling bad or failure about yourself  - 0  Trouble concentrating - 0  Moving slowly or fidgety/restless - 0  Suicidal thoughts - 0  PHQ-9 Score - 2   .Marland KitchenStart a regular exercise program and make sure you are eating a healthy diet Try to eat 4 servings of dairy a day or take a calcium supplement (500mg  twice a day). Your vaccines are up to date.  Fasting labs done.  Colonoscopy up to date.  Medications refilled.  Discussed weight gain over last year. Pt agress to start back with some exercise and diet changes.   [name].IPSS Questionnaire (AUA-7): Over the past month.   1)  How often have you had a sensation of not emptying your bladder completely after you finish urinating?  0 - Not at all  2)  How often have you had to  urinate again less than two hours after you finished urinating? 0 - Not at all  3)  How often have you found you stopped and started again several times when you urinated?  0 - Not at all  4) How difficult have you found it  to postpone urination?  0 - Not at all  5) How often have you had a weak urinary stream?  0 - Not at all  6) How often have you had to push or strain to begin urination?  0 - Not at all  7) How many times did you most typically get up to urinate from the time you went to bed until the time you got up in the morning?  0 - None  Total score:  0-7 mildly symptomatic   8-19 moderately symptomatic   20-35 severely symptomatic

## 2018-07-08 ENCOUNTER — Ambulatory Visit: Admitting: Family Medicine

## 2018-07-09 ENCOUNTER — Encounter: Payer: Self-pay | Admitting: Physician Assistant

## 2018-07-09 ENCOUNTER — Ambulatory Visit (INDEPENDENT_AMBULATORY_CARE_PROVIDER_SITE_OTHER): Admitting: Physician Assistant

## 2018-07-09 VITALS — BP 100/83 | HR 88 | Ht 72.0 in | Wt 240.0 lb

## 2018-07-09 DIAGNOSIS — Z23 Encounter for immunization: Secondary | ICD-10-CM

## 2018-07-09 DIAGNOSIS — L600 Ingrowing nail: Secondary | ICD-10-CM

## 2018-07-09 MED ORDER — DOXYCYCLINE HYCLATE 100 MG PO TABS: 100.0000 mg | ORAL_TABLET | Freq: Two times a day (BID) | ORAL | 0 refills | Status: DC

## 2018-07-09 NOTE — Patient Instructions (Signed)
Ingrown Toenail An ingrown toenail occurs when the corner or sides of your toenail grow into the surrounding skin. The big toe is most commonly affected, but it can happen to any of your toes. If your ingrown toenail is not treated, you will be at risk for infection. What are the causes? This condition may be caused by:  Wearing shoes that are too small or tight.  Injury or trauma, such as stubbing your toe or having your toe stepped on.  Improper cutting or care of your toenails.  Being born with (congenital) nail or foot abnormalities, such as having a nail that is too big for your toe.  What increases the risk? Risk factors for an ingrown toenail include:  Age. Your nails tend to thicken as you get older, so ingrown nails are more common in older people.  Diabetes.  Cutting your toenails incorrectly.  Blood circulation problems.  What are the signs or symptoms? Symptoms may include:  Pain, soreness, or tenderness.  Redness.  Swelling.  Hardening of the skin surrounding the toe.  Your ingrown toenail may be infected if there is fluid, pus, or drainage. How is this diagnosed? An ingrown toenail may be diagnosed by medical history and physical exam. If your toenail is infected, your health care provider may test a sample of the drainage. How is this treated? Treatment depends on the severity of your ingrown toenail. Some ingrown toenails may be treated at home. More severe or infected ingrown toenails may require surgery to remove all or part of the nail. Infected ingrown toenails may also be treated with antibiotic medicines. Follow these instructions at home:  If you were prescribed an antibiotic medicine, finish all of it even if you start to feel better.  Soak your foot in warm soapy water for 20 minutes, 3 times per day or as directed by your health care provider.  Carefully lift the edge of the nail away from the sore skin by wedging a small piece of cotton under  the corner of the nail. This may help with the pain. Be careful not to cause more injury to the area.  Wear shoes that fit well. If your ingrown toenail is causing you pain, try wearing sandals, if possible.  Trim your toenails regularly and carefully. Do not cut them in a curved shape. Cut your toenails straight across. This prevents injury to the skin at the corners of the toenail.  Keep your feet clean and dry.  If you are having trouble walking and are given crutches by your health care provider, use them as directed.  Do not pick at your toenail or try to remove it yourself.  Take medicines only as directed by your health care provider.  Keep all follow-up visits as directed by your health care provider. This is important. Contact a health care provider if:  Your symptoms do not improve with treatment. Get help right away if:  You have red streaks that start at your foot and go up your leg.  You have a fever.  You have increased redness, swelling, or pain.  You have fluid, blood, or pus coming from your toenail. This information is not intended to replace advice given to you by your health care provider. Make sure you discuss any questions you have with your health care provider. Document Released: 10/24/2000 Document Revised: 03/28/2016 Document Reviewed: 09/20/2014 Elsevier Interactive Patient Education  2018 [employer]  

## 2018-07-10 ENCOUNTER — Encounter: Payer: Self-pay | Admitting: Physician Assistant

## 2018-07-10 NOTE — Progress Notes (Signed)
   Subjective:    Patient ID: John Sharp, male    DOB: 1961/08/17, 57 y.o.   MRN: [id number]  HPI Pt is a 57 yo male who presents to the clinic with painful great right toe. Hx of ingrown toenails. Started to be tender over toe a week ago but now toe is red, swollen and very painful. Not tried anything to make any better.   .. Active Ambulatory Problems    Diagnosis Date Noted  . 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 GERD (gastroesophageal reflux disease) 05/13/2012  . Erectile dysfunction 05/24/2012  . Moderate obstructive sleep apnea 06/07/2012  . Depression 07/29/2013  . Medial epicondylitis of right elbow 03/10/2014  . Primary osteoarthritis of both knees 04/28/2014  . Essential tremor 09/19/2016  . Swelling of arm 06/16/2017  . Trochanteric bursitis, left hip 06/16/2017  . Obstructive uropathy 06/16/2017  . Seasonal allergies 06/10/2018   Resolved Ambulatory Problems    Diagnosis Date Noted  . Cough 01/06/2014  . Bilateral ankle pain 05/10/2014  . Elevated PSA 01/08/2015  . Increased laboratory test result 01/08/2015  . No energy 08/03/2015   No Additional Past Medical History      Review of Systems See HPI.     Objective:   Physical Exam  Constitutional: He is oriented to person, place, and time. He appears well-developed and well-nourished.  Cardiovascular: Normal rate and regular rhythm.  Neurological: He is alert and oriented to person, place, and time.  Skin:  Great right medial paronychia red, swollen, tender, oozing pus with evidence of ingrown toenail.   Psychiatric: He has a normal mood and affect. His behavior is normal.          Assessment & Plan:  Marland KitchenMarland KitchenDiagnoses and all orders for this visit:  Ingrown right greater toenail -     doxycycline (VIBRA-TABS) 100 MG tablet; Take 1 tablet (100 mg total) by mouth 2 (two) times daily. For 10 days.  Need for immunization against influenza -     Flu Vaccine QUAD 36+ mos IM   Partial toenail removal done in  office and given doxycycline for infection.   Toenail Avulsion Procedure Note  Pre-operative Diagnosis: Right Ingrown Great toenail   Post-operative Diagnosis: Right Ingrown Great toenail  Indications: pain/infection  Anesthesia: Lidocaine 1% without epinephrine without added sodium bicarbonate  Procedure Details  History of allergy to iodine: no  The risks (including bleeding and infection) and benefits of the  procedure and Verbal informed consent obtained.  After digital block anesthesia was obtained, a tourniquet was applied for hemostasis during the procedure.  After prepping with Betadine, the offending edge of the nail was freed from the nailbed and perionychium, and then split with scissors and removed with  forceps.  All visible granulation tissue is debrided. Antibiotic and bulky dressing was applied.   Findings: Ingrown/infected toenail.   Complications: none.  Plan: 1. Soak the foot twice daily. Change dressing twice daily until healed over. 2. Warning signs of infection were reviewed.   3. Recommended that the patient use OTC acetaminophen as needed for pain.

## 2018-07-21 ENCOUNTER — Other Ambulatory Visit: Payer: Self-pay | Admitting: Physician Assistant

## 2018-07-21 DIAGNOSIS — N529 Male erectile dysfunction, unspecified: Secondary | ICD-10-CM

## 2018-07-21 NOTE — Progress Notes (Signed)
Pt request another urologist. Will make referral.

## 2018-07-30 ENCOUNTER — Telehealth: Payer: Self-pay | Admitting: Physician Assistant

## 2018-07-30 DIAGNOSIS — L600 Ingrowing nail: Secondary | ICD-10-CM

## 2018-07-30 MED ORDER — DOXYCYCLINE HYCLATE 100 MG PO TABS: 100.0000 mg | ORAL_TABLET | Freq: Two times a day (BID) | ORAL | 0 refills | Status: DC

## 2018-07-30 NOTE — Telephone Encounter (Signed)
Pt is still having problems. He feels like some toenail is left in. He feels like infection is present with red, warm, swollen. Referral for podiatry made with another round of doxycycline.

## 2018-07-30 NOTE — Telephone Encounter (Signed)
I called [facility] and Seth Bake was calling patient to get him scheduled soon - CF

## 2018-07-30 NOTE — Telephone Encounter (Signed)
Thanks

## 2018-08-02 ENCOUNTER — Ambulatory Visit: Payer: Self-pay | Admitting: Podiatry

## 2018-08-02 ENCOUNTER — Ambulatory Visit (INDEPENDENT_AMBULATORY_CARE_PROVIDER_SITE_OTHER): Admitting: Podiatry

## 2018-08-02 ENCOUNTER — Encounter: Payer: Self-pay | Admitting: Podiatry

## 2018-08-02 VITALS — BP 129/87 | HR 92 | Ht 72.0 in | Wt 240.0 lb

## 2018-08-02 DIAGNOSIS — M79671 Pain in right foot: Secondary | ICD-10-CM | POA: Diagnosis not present

## 2018-08-02 DIAGNOSIS — L600 Ingrowing nail: Secondary | ICD-10-CM

## 2018-08-02 NOTE — Patient Instructions (Signed)
Right great toe nail is healing without drainage or edema or erythema. Continue with daily soaking and antibiotic ointment dressing till the area gets completely clean and dry and pain free. Return as needed.

## 2018-08-02 NOTE — Progress Notes (Signed)
SUBJECTIVE: 57 y.o. year old male presents stating that he had done an ingrown nail surgery on right great toe medial border 2 weeks ago. Stated that the toe was red and he took 2nd round of antibiotics. Stated that the toe look better. Patient is here to be checked out. He is very active at work wearing steel toed shoes and on feet all day.    Review of Systems  Constitutional: Negative.   HENT: Negative.   Eyes: Negative.   Respiratory: Negative.   Cardiovascular: Negative.   Gastrointestinal: Negative.   Genitourinary: Negative.   Musculoskeletal: Negative.   Skin: Negative.      OBJECTIVE: DERMATOLOGIC EXAMINATION: Clean nail border following removal of nail plate at medial border. No edema or erythema noted.   VASCULAR EXAMINATION OF LOWER LIMBS: Pedal pulses are all palpable. No abnormal edema or erythema noted.  NEUROLOGIC EXAMINATION OF THE LOWER LIMBS: All epicritic and tactile sensations grossly intact. Sharp and Dull discriminatory sensations at the plantar ball of hallux is intact bilateral.   MUSCULOSKELETAL EXAMINATION: No gross deformities noted.  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  PLAN: Reviewed findings and available treatment options. Patient is to return for permanent procedure if condition returns with pain.

## 2018-09-05 ENCOUNTER — Telehealth: Payer: Self-pay | Admitting: Physician Assistant

## 2018-09-05 DIAGNOSIS — L600 Ingrowing nail: Secondary | ICD-10-CM

## 2018-09-05 MED ORDER — DOXYCYCLINE HYCLATE 100 MG PO TABS: 100.0000 mg | ORAL_TABLET | Freq: Two times a day (BID) | ORAL | 0 refills | Status: DC

## 2018-09-05 NOTE — Telephone Encounter (Signed)
I ran into patient over the weekend. Stated his right knee is red and swollen and very painful. Hx of septic bursitis. Sent doxycycline and to follow up with Dr. Darene Lamer this week to drain knee. Need to rule out infection vs gout. Can temporaly take ibuprofen 800mg  TID and ice knee. No fever.

## 2018-09-06 ENCOUNTER — Ambulatory Visit (INDEPENDENT_AMBULATORY_CARE_PROVIDER_SITE_OTHER): Admitting: Sports Medicine

## 2018-09-06 ENCOUNTER — Encounter: Payer: Self-pay | Admitting: Sports Medicine

## 2018-09-06 DIAGNOSIS — M11261 Other chondrocalcinosis, right knee: Secondary | ICD-10-CM | POA: Insufficient documentation

## 2018-09-06 DIAGNOSIS — M7041 Prepatellar bursitis, right knee: Secondary | ICD-10-CM

## 2018-09-06 HISTORY — DX: Prepatellar bursitis, right knee: M70.41

## 2018-09-06 NOTE — Progress Notes (Addendum)
Subjective:    I'm seeing this patient as a consultation for: John Planas, PA-C  CC: Right knee swelling  HPI: For the past 3 days after eating [place] food this pleasant 57 year old male has had redness, swelling on the anterior aspect of his right knee.  No fevers or chills, no trauma.  Symptoms are moderate, persistent.  He saw his PCP, and is referred here for further evaluation and definitive treatment.  She did start him on doxycycline.  I reviewed the past medical history, family history, social history, surgical history, and allergies today and no changes were needed.  Please see the problem list section below in epic for further details.  Past Medical History: Past Medical History:  Diagnosis Date  . Erectile dysfunction   . GERD (gastroesophageal reflux disease)   . Prepatellar bursitis, right knee 09/06/2018   Past Surgical History: Past Surgical History:  Procedure Laterality Date  . APPENDECTOMY    . Left ankle repair  2012   Social History: Social History   Socioeconomic History  . Marital status: Married    Spouse name: Not on file  . Number of children: Not on file  . Years of education: Not on file  . Highest education level: Not on file  Occupational History  . Not on file  Social Needs  . Financial resource strain: Not on file  . Food insecurity:    Worry: Not on file    Inability: Not on file  . Transportation needs:    Medical: Not on file    Non-medical: Not on file  Tobacco Use  . Smoking status: Current Some Day Smoker  . Smokeless tobacco: Never Used  Substance and Sexual Activity  . Alcohol use: Not on file  . Drug use: Not on file  . Sexual activity: Not on file  Lifestyle  . Physical activity:    Days per week: Not on file    Minutes per session: Not on file  . Stress: Not on file  Relationships  . Social connections:    Talks on phone: Not on file    Gets together: Not on file    Attends religious service: Not on file   Active member of club or organization: Not on file    Attends meetings of clubs or organizations: Not on file    Relationship status: Not on file  Other Topics Concern  . Not on file  Social History Narrative  . Not on file   Family History: No family history on file. Allergies: No Known Allergies Medications: See med rec.  Review of Systems: No headache, visual changes, nausea, vomiting, diarrhea, constipation, dizziness, abdominal pain, skin rash, fevers, chills, night sweats, weight loss, swollen lymph nodes, body aches, joint swelling, muscle aches, chest pain, shortness of breath, mood changes, visual or auditory hallucinations.   Objective:   General: Well Developed, well nourished, and in no acute distress.  Neuro:  Extra-ocular muscles intact, able to move all 4 extremities, sensation grossly intact.  Deep tendon reflexes tested were normal. Psych: Alert and oriented, mood congruent with affect. ENT:  Ears and nose appear unremarkable.  Hearing grossly normal. Neck: Unremarkable overall appearance, trachea midline.  No visible thyroid enlargement. Eyes: Conjunctivae and lids appear unremarkable.  Pupils equal and round. Skin: Warm and dry, no rashes noted.  Cardiovascular: Pulses palpable, no extremity edema. Right knee: Visibly swollen with a hot, erythematous, prepatellar bursa. No joint effusion. ROM normal in flexion and extension and lower leg rotation.  Ligaments with solid consistent endpoints including ACL, PCL, LCL, MCL. Negative Mcmurray's and provocative meniscal tests. Non painful patellar compression. Patellar and quadriceps tendons unremarkable. Hamstring and quadriceps strength is normal.  Procedure: Real-time Ultrasound Guided aspiration/injection of left prepatellar bursa Device: GE Logiq E  Verbal informed consent obtained.  Time-out conducted.  Noted no overlying erythema, induration, or other signs of local infection.  Skin prepped in a sterile  fashion.  Local anesthesia: Topical Ethyl chloride.  With sterile technique and under real time ultrasound guidance: Aspirated approximately 5 mL of cloudy, straw-colored fluid, syringe switched and 1 cc kenalog 40, 1 cc lidocaine injected easily. Completed without difficulty  Pain immediately resolved suggesting accurate placement of the medication.  Advised to call if fevers/chills, erythema, induration, drainage, or persistent bleeding.  Images permanently stored and available for review in the ultrasound unit.  Impression: Technically successful ultrasound guided injection.  Impression and Recommendations:   This case required medical decision making of moderate complexity.  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 Aspiration and injection. Strap with compressive dressing. Crystal analysis, cultures. When he returns we will check uric acid levels if crystal analysis confirms gout.  Arthrocentesis of the bursa is positive for calcium pyrophosphate crystals confirming pseudogout.  It should be feeling a lot better right now but in the future treatment will be colchicine twice a day for 1 week and then daily until symptoms resolved. ___________________________________________ Gwen Her. Dianah Field, M.D., ABFM., CAQSM. Primary Care and Sports Medicine [facility]  Adjunct Professor of [employer] [facility] of Medicine

## 2018-09-06 NOTE — Assessment & Plan Note (Addendum)
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 Aspiration and injection. Strap with compressive dressing. Crystal analysis, cultures. When he returns we will check uric acid levels if crystal analysis confirms gout.  Arthrocentesis of the bursa is positive for calcium pyrophosphate crystals confirming pseudogout.  It should be feeling a lot better right now but in the future treatment will be colchicine twice a day for 1 week and then daily until symptoms resolved.

## 2018-09-07 ENCOUNTER — Encounter: Payer: Self-pay | Admitting: Sports Medicine

## 2018-09-12 LAB — ANAEROBIC AND AEROBIC CULTURE
AER RESULT:: NO GROWTH
MICRO NUMBER:: 91294945
MICRO NUMBER:: 91294946
SPECIMEN QUALITY:: ADEQUATE
SPECIMEN QUALITY:: ADEQUATE

## 2018-09-12 LAB — SYNOVIAL FLUID, CRYSTAL

## 2018-09-22 ENCOUNTER — Telehealth: Payer: Self-pay

## 2018-09-22 DIAGNOSIS — M11261 Other chondrocalcinosis, right knee: Secondary | ICD-10-CM

## 2018-09-22 MED ORDER — COLCHICINE 0.6 MG PO TABS: ORAL_TABLET | ORAL | 2 refills | Status: DC

## 2018-09-22 NOTE — Telephone Encounter (Signed)
Pseudogout does not respond to antibiotics because it is not an infection, we are going to use colchicine twice a day for a week and then once daily afterwards.  Remind him that he does not have gout, it is pseudogout.

## 2018-09-22 NOTE — Telephone Encounter (Signed)
PT's gout is still hurting. PT's wife would like to see if he can get another round of antibiotic.

## 2018-09-22 NOTE — Telephone Encounter (Signed)
Pt's wife advised, verbalized understanding. She will get colchicine Rx today.

## 2018-09-27 ENCOUNTER — Encounter: Payer: Self-pay | Admitting: Family Medicine

## 2018-09-27 ENCOUNTER — Ambulatory Visit (INDEPENDENT_AMBULATORY_CARE_PROVIDER_SITE_OTHER): Admitting: Family Medicine

## 2018-09-27 VITALS — BP 121/82 | HR 76

## 2018-09-27 DIAGNOSIS — M7041 Prepatellar bursitis, right knee: Secondary | ICD-10-CM

## 2018-09-27 MED ORDER — PREDNISONE 50 MG PO TABS: 50.0000 mg | ORAL_TABLET | Freq: Every day | ORAL | 0 refills | Status: DC

## 2018-09-27 MED ORDER — DOXYCYCLINE HYCLATE 100 MG PO TABS: 100.0000 mg | ORAL_TABLET | Freq: Two times a day (BID) | ORAL | 0 refills | Status: DC

## 2018-09-27 NOTE — Progress Notes (Signed)
John Sharp is a 57 y.o. male who presents to [facility] today for follow-up of pre-patellar bursitis of right knee. On 10/27, he was started on doxycycline for red, swollen, and painful knee. He had aspiration and injection on 10/28, and was started on colchicine. Synovial fluid returned positive for calcium pyrophosphate culture was negative.[name] He continues on colchicine. He started having pain extended from his knee down his shin over the past couple of days. It hurts worse when standing from sitting. He and his wife are leaving for [facility] on Saturday for a week vacation. They are concerned that it will worsen while they are gone.     ROS:  As above  Exam:  BP 121/82   Pulse 76  General: Well Developed, well nourished, and in no acute distress.  Neuro/Psych: Alert and oriented x3, extra-ocular muscles intact, able to move all 4 extremities, sensation grossly intact. Skin: Warm and dry, no rashes noted.  Respiratory: Not using accessory muscles, speaking in full sentences, trachea midline.  Cardiovascular: Pulses palpable, no extremity edema. Abdomen: Does not appear distended. MSK:  Right knee: Mild erythema anterior knee. Fluctuance overlying patella and patellar tendon not particularly tender. Normal ROM.  Strength intact flexion and extension. Stable ligaments exam Pulses and capillary refill normal.    Lab and Radiology Results Limited musculoskeletal ultrasound right anterior knee reveals hypoechoic fluid collection in the prepatellar bursa area.  Small no bony or tendon involvement.  Normal bony tendon structures. Consistent with small prepatellar bursitis.    Assessment and Plan: 57 y.o. male with  Prepatellar bursitis of right knee: John Sharp is returning due to increased pain in his right knee since aspiration on 10/28 and concern for worsening during trip to [place] this week. On ultrasound, not enough  fluid visualized in prepatellar bursitis to aspirate at this time. Advised pt to continue colchicine. Prescribed doxycycline and prednisone to take if worsening while on their trip. Recommended getting a compression knee sleeve to improve the bursitis. Follow-up if not improving.      Meds ordered this encounter  Medications  . doxycycline (VIBRA-TABS) 100 MG tablet    Sig: Take 1 tablet (100 mg total) by mouth 2 (two) times daily.    Dispense:  14 tablet    Refill:  0  . predniSONE (DELTASONE) 50 MG tablet    Sig: Take 1 tablet (50 mg total) by mouth daily.    Dispense:  5 tablet    Refill:  0    Historical information moved to improve visibility of documentation.  Past Medical History:  Diagnosis Date  . Erectile dysfunction   . GERD (gastroesophageal reflux disease)   . Prepatellar bursitis, right knee 09/06/2018   Past Surgical History:  Procedure Laterality Date  . APPENDECTOMY    . Left ankle repair  2012   Social History   Tobacco Use  . Smoking status: Current Some Day Smoker  . Smokeless tobacco: Never Used  Substance Use Topics  . Alcohol use: Not on file   family history is not on file.  Medications: Current Outpatient Medications  Medication Sig Dispense Refill  . AMBULATORY NON FORMULARY MEDICATION CPAP full face mask and associated equipment. 1 Device 0  . aspirin 325 MG tablet Take 325 mg by mouth daily.    . Ceftriaxone Sodium POWD 1 g powder vials to be reconstituted at home and  injected intramuscular every 12 hours 7 days. Please include 3 mL  syringes, 22-gauge 1.5 inch needles, blunt draw needles. 14 g 0  . clotrimazole-betamethasone (LOTRISONE) cream Apply 1 application topically 2 (two) times daily. 30 g 0  . colchicine 0.6 MG tablet 1 tab p.o. twice daily for a week then daily 60 tablet 2  . fish oil-omega-3 fatty acids 1000 MG capsule Take 2 g by mouth daily.    . montelukast (SINGULAIR) 10 MG tablet Take 1 tablet (10 mg total) by mouth at  bedtime. 90 tablet 3  . Multiple Vitamin (MULTIVITAMIN) capsule Take 1 capsule by mouth daily.    . pantoprazole (PROTONIX) 40 MG tablet Take 1 tablet (40 mg total) by mouth daily. 90 tablet 3  . tamsulosin (FLOMAX) 0.4 MG CAPS capsule TAKE 1 CAPSULE(0.4 MG) BY MOUTH DAILY 90 capsule 3  . vitamin B-12 (CYANOCOBALAMIN) 250 MCG tablet Take 250 mcg by mouth daily.    . vitamin C (ASCORBIC ACID) 500 MG tablet Take 500 mg by mouth daily.    [name] doxycycline (VIBRA-TABS) 100 MG tablet Take 1 tablet (100 mg total) by mouth 2 (two) times daily. 14 tablet 0  . predniSONE (DELTASONE) 50 MG tablet Take 1 tablet (50 mg total) by mouth daily. 5 tablet 0   No current facility-administered medications for this visit.    No Known Allergies    Discussed warning signs or symptoms. Please see discharge instructions. Patient expresses understanding.  I personally was present and performed or re-performed the history, physical exam and medical decision-making activities of this service and have verified that the service and findings are accurately documented in the student's note. ___________________________________________ Lynne Leader M.D., ABFM., CAQSM. Primary Care and Sports Medicine Adjunct Instructor of [employer] [facility] of Medicine

## 2018-09-27 NOTE — Patient Instructions (Signed)
Thank you for coming in today. Continue colchicine.  If not improving or if worsening take the prednisone and doxycycline.  Use compression.  Use compressive knee sleeve.  Body Helix Full knee  Recheck if not improving.    Prepatellar Bursitis Rehab Ask your health care provider which exercises are safe for you. Do exercises exactly as told by your health care provider and adjust them as directed. It is normal to feel mild stretching, pulling, tightness, or discomfort as you do these exercises, but you should stop right away if you feel sudden pain or your pain gets worse.Do not begin these exercises until told by your health care provider. Stretching and range of motion exercises These exercises warm up your muscles and joints and improve the movement and flexibility of your knee. These exercises also help to relieve pain, numbness, and tingling. Exercise A: Hamstring, standing  1. Stand with your __________ foot resting on a chair. Your __________ leg should be fully extended. 2. Arch your lower back slightly. 3. Leading with your chest, lean forward at the waist until you feel a gentle stretch in the back of your __________ knee or in your thigh. You should not need to lean far to feel a stretch. 4. Hold this position for __________ seconds. Repeat __________ times. Complete this stretch __________ times a day. Exercise B: Knee flexion, active heel slides 1. Lie on your back with both knees straight. If this causes back discomfort, bend your __________ knee so your foot is flat on the floor. 2. Slowly slide your __________ heel back toward your buttocks until you feel a gentle stretch in the front of your knee or thigh. 3. Hold this position for __________ seconds. 4. Slowly slide your __________ heel back to the starting position. Repeat __________ times. Complete this stretch __________ times a day. Strengthening exercises These exercises build strength and endurance in your knee.  Endurance is the ability to use your muscles for a long time, even after they get tired. Exercise C: Quadriceps, isometric  1. Lie on your back with your __________ leg extended and your __________ knee bent. 2. Slowly tense the muscles in the front of your __________ thigh. When you do this, you should see your kneecap slide up toward your hip or see increased dimpling just above the knee. This motion will push the back of your knee down toward the surface that is under it. 3. For __________ seconds, keep the muscle as tight as you can without increasing your pain. 4. Relax the muscles slowly and completely. Repeat __________ times. Complete this exercise __________ times a day. Exercise D: Straight leg raises ( quadriceps) 1. Lie on your back with your __________ leg extended and your __________ knee bent. 2. Slowly tense the muscles in your __________ thigh. When you do this, you should see your kneecap slide up toward your hip or see increased dimpling just above the knee. 3. Keep these muscles tight as you raise your leg 4-6 inches (10-15 cm) off the floor. 4. Hold this position for __________ seconds. 5. Keep these muscles tense as you lower your leg slowly. 6. Relax your muscles slowly and completely. Repeat __________ times. Complete this exercise __________ times a day. Exercise E: Straight leg raises ( hip extensors) 1. Lie on your abdomen on a bed or a firm surface. You can put a pillow under your hips if that is more comfortable. 2. Tense your buttock muscles and lift your __________ thigh. Your __________ knee can be  bent or straight, but do not let your back arch. 3. Hold this position for __________ seconds. 4. Slowly lower your leg to the starting position. 5. Let your muscles relax completely. Repeat __________ times. Complete this exercise __________ times a day. This information is not intended to replace advice given to you by your health care provider. Make sure you  discuss any questions you have with your health care provider. Document Released: 10/27/2005 Document Revised: 07/01/2016 Document Reviewed: 07/27/2015 Elsevier Interactive Patient Education  [employer].

## 2018-10-11 ENCOUNTER — Encounter: Payer: Self-pay | Admitting: Sports Medicine

## 2018-10-11 ENCOUNTER — Ambulatory Visit (INDEPENDENT_AMBULATORY_CARE_PROVIDER_SITE_OTHER): Admitting: Sports Medicine

## 2018-10-11 ENCOUNTER — Ambulatory Visit (INDEPENDENT_AMBULATORY_CARE_PROVIDER_SITE_OTHER)

## 2018-10-11 DIAGNOSIS — M11261 Other chondrocalcinosis, right knee: Secondary | ICD-10-CM | POA: Diagnosis not present

## 2018-10-11 DIAGNOSIS — Z8781 Personal history of (healed) traumatic fracture: Secondary | ICD-10-CM

## 2018-10-11 DIAGNOSIS — Z9889 Other specified postprocedural states: Secondary | ICD-10-CM | POA: Diagnosis not present

## 2018-10-11 DIAGNOSIS — M71161 Other infective bursitis, right knee: Secondary | ICD-10-CM

## 2018-10-11 DIAGNOSIS — M25572 Pain in left ankle and joints of left foot: Secondary | ICD-10-CM

## 2018-10-11 NOTE — Assessment & Plan Note (Signed)
Pain over the left fibular plate, x-rays, evaluation for hardware loosening.

## 2018-10-11 NOTE — Progress Notes (Addendum)
Subjective:    CC: Right knee swelling  HPI: This is a pleasant 57 year old male with pseudogout, we initially diagnosed this on a arthrocentesis of his right prepatellar bursa about a month and a half ago.  He did well after injection.  He has been using colchicine once a day.  More recently he had a recurrence of symptoms, saw Dr. Georgina Snell and was appropriately started on doxycycline.  He returns with persistence of symptoms, no fevers or chills, just redness, warmth and swelling on the anterior right knee.  He is post ORIF of both ankles after a fall with a fracture many years ago.  He feels as though some of the screws may be loose on his left fibular plate.  I reviewed the past medical history, family history, social history, surgical history, and allergies today and no changes were needed.  Please see the problem list section below in epic for further details.  Past Medical History: Past Medical History:  Diagnosis Date  . Erectile dysfunction   . GERD (gastroesophageal reflux disease)   . Prepatellar bursitis, right knee 09/06/2018   Past Surgical History: Past Surgical History:  Procedure Laterality Date  . APPENDECTOMY    . Left ankle repair  2012   Social History: Social History   Socioeconomic History  . Marital status: Married    Spouse name: Not on file  . Number of children: Not on file  . Years of education: Not on file  . Highest education level: Not on file  Occupational History  . Not on file  Social Needs  . Financial resource strain: Not on file  . Food insecurity:    Worry: Not on file    Inability: Not on file  . Transportation needs:    Medical: Not on file    Non-medical: Not on file  Tobacco Use  . Smoking status: Current Some Day Smoker  . Smokeless tobacco: Never Used  Substance and Sexual Activity  . Alcohol use: Not on file  . Drug use: Not on file  . Sexual activity: Not on file  Lifestyle  . Physical activity:    Days per week: Not on  file    Minutes per session: Not on file  . Stress: Not on file  Relationships  . Social connections:    Talks on phone: Not on file    Gets together: Not on file    Attends religious service: Not on file    Active member of club or organization: Not on file    Attends meetings of clubs or organizations: Not on file    Relationship status: Not on file  Other Topics Concern  . Not on file  Social History Narrative  . Not on file   Family History: No family history on file. Allergies: No Known Allergies Medications: See med rec.  Review of Systems: No fevers, chills, night sweats, weight loss, chest pain, or shortness of breath.   Objective:    General: Well Developed, well nourished, and in no acute distress.  Neuro: Alert and oriented x3, extra-ocular muscles intact, sensation grossly intact.  HEENT: Normocephalic, atraumatic, pupils equal round reactive to light, neck supple, no masses, no lymphadenopathy, thyroid nonpalpable.  Skin: Warm and dry, no rashes. Cardiac: Regular rate and rhythm, no murmurs rubs or gallops, no lower extremity edema.  Respiratory: Clear to auscultation bilaterally. Not using accessory muscles, speaking in full sentences. Right knee: Visibly swollen prepatellar bursa with erythema, warmth. ROM normal in flexion and extension  and lower leg rotation. Ligaments with solid consistent endpoints including ACL, PCL, LCL, MCL. Negative Mcmurray's and provocative meniscal tests. Non painful patellar compression. Patellar and quadriceps tendons unremarkable. Hamstring and quadriceps strength is normal.  Procedure: Real-time Ultrasound Guided aspiration/injection of right prepatellar bursa Device: GE Logiq E  Verbal informed consent obtained.  Time-out conducted.  Noted no overlying erythema, induration, or other signs of local infection.  Skin prepped in a sterile fashion.  Local anesthesia: Topical Ethyl chloride.  With sterile technique and under  real time ultrasound guidance: Thick, cloudy, yellow fluid aspirated from the bursa.  Syringe switched and 1 cc Kenalog 40, 1 cc lidocaine injected easily. Completed without difficulty  Pain immediately resolved suggesting accurate placement of the medication.  Advised to call if fevers/chills, erythema, induration, drainage, or persistent bleeding.  Images permanently stored and available for review in the ultrasound unit.  Impression: Technically successful ultrasound guided injection.  The knee was then strapped with a compressive dressing.  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 Aspiration, injection, cell counts, crystal analysis. He did finish a course of doxycycline. Strap with compressive dressing. Increase colchicine to twice a day during flares and then back down to once a day.  Status post ORIF of fracture of ankle Pain over the left fibular plate, x-rays, evaluation for hardware loosening.  Septic prepatellar bursitis of right knee Culture is growing out Staphylococcus. Adding doxycycline, he needs to start this immediately. Ultimately this is treated with serial aspirations +/- irrigation. I would like him to see Dr. Georgina Snell today for aspiration of the prepatellar bursitis today, +/- irrigation. Would then like him to see me at the end of the week for repeat of the same. ___________________________________________ Gwen Her. Dianah Field, M.D., ABFM., CAQSM. Primary Care and Sports Medicine [facility]  Adjunct Professor of [employer] [facility] [employer]

## 2018-10-11 NOTE — Assessment & Plan Note (Signed)
Recurrent right prepatellar bursitis. Aspiration, injection, cell counts, crystal analysis. He did finish a course of doxycycline. Strap with compressive dressing. Increase colchicine to twice a day during flares and then back down to once a day.

## 2018-10-12 LAB — CELL COUNT + DIFF, W/O CRYST-SYNVL FLD
Basophils, %: 0 %
Eosinophils-Synovial: 0 % (ref 0–2)
Lymphocytes-Synovial Fld: 3 % (ref 0–74)
Monocyte/Macrophage: 10 % (ref 0–69)
Neutrophil, Synovial: 87 % — ABNORMAL HIGH (ref 0–24)
Synoviocytes, %: 0 % (ref 0–15)
WBC, Synovial: 8230 cells/uL — ABNORMAL HIGH (ref <150)

## 2018-10-12 LAB — SYNOVIAL FLUID, CRYSTAL

## 2018-10-13 ENCOUNTER — Ambulatory Visit (INDEPENDENT_AMBULATORY_CARE_PROVIDER_SITE_OTHER): Admitting: Family Medicine

## 2018-10-13 ENCOUNTER — Encounter: Payer: Self-pay | Admitting: Family Medicine

## 2018-10-13 VITALS — BP 130/81 | HR 75 | Wt 249.0 lb

## 2018-10-13 DIAGNOSIS — M71161 Other infective bursitis, right knee: Secondary | ICD-10-CM

## 2018-10-13 MED ORDER — DOXYCYCLINE HYCLATE 100 MG PO TABS: 100.0000 mg | ORAL_TABLET | Freq: Two times a day (BID) | ORAL | 0 refills | Status: DC

## 2018-10-13 NOTE — Addendum Note (Signed)
Addended by: Silverio Decamp on: 10/13/2018 11:29 AM   Modules accepted: Orders

## 2018-10-13 NOTE — Assessment & Plan Note (Signed)
Culture is growing out Staphylococcus. Adding doxycycline, he needs to start this immediately. Ultimately this is treated with serial aspirations +/- irrigation. I would like him to see Dr. Georgina Snell today for aspiration of the prepatellar bursitis today, +/- irrigation. Would then like him to see me at the end of the week for repeat of the same.

## 2018-10-13 NOTE — Patient Instructions (Signed)
Thank you for coming in today. Recheck in about 1 week or so.  Return sooner if needed.  Follow up with Dr T or myself.  Change dressing daily or if it gets dirty.  Continue compression If you cannot get it to stop bleeding let us know.  Apply firm pressure for 30 mins.  Recheck early in the morning if needed.  Or go to the ER if you gust cant get it to stop.    Skin Abscess A skin abscess is an infected area on or under your skin that contains a collection of pus and other material. An abscess may also be called a furuncle, carbuncle, or boil. An abscess can occur in or on almost any part of your body. Some abscesses break open (rupture) on their own. Most continue to get worse unless they are treated. The infection can spread deeper into the body and eventually into your blood, which can make you feel ill. Treatment usually involves draining the abscess. What are the causes? An abscess occurs when germs, often bacteria, pass through your skin and cause an infection. This may be caused by:  A scrape or cut on your skin.  A puncture wound through your skin, including a needle injection.  Blocked oil or sweat glands.  Blocked and infected hair follicles.  A cyst that forms beneath your skin (sebaceous cyst) and becomes infected.  What increases the risk? This condition is more likely to develop in people who:  Have a weak body defense system (immune system).  Have diabetes.  Have dry and irritated skin.  Get frequent injections or use illegal IV drugs.  Have a foreign body in a wound, such as a splinter.  Have problems with their lymph system or veins.  What are the signs or symptoms? An abscess may start as a painful, firm bump under the skin. Over time, the abscess may get larger or become softer. Pus may appear at the top of the abscess, causing pressure and pain. It may eventually break through the skin and drain. Other symptoms  include:  Redness.  Warmth.  Swelling.  Tenderness.  A sore on the skin.  How is this diagnosed? This condition is diagnosed based on your medical history and a physical exam. A sample of pus may be taken from the abscess to find out what is causing the infection and what antibiotics can be used to treat it. You also may have:  Blood tests to look for signs of infection or spread of an infection to your blood.  Imaging studies such as ultrasound, CT scan, or MRI if the abscess is deep.  How is this treated? Small abscesses that drain on their own may not need treatment. Treatment for an abscess that does not rupture on its own may include:  Warm compresses applied to the area several times per day.  Incision and drainage. Your health care provider will make an incision to open the abscess and will remove pus and any foreign body or dead tissue. The incision area may be packed with gauze to keep it open for a few days while it heals.  Antibiotic medicines to treat infection. For a severe abscess, you may first get antibiotics through an IV and then change to oral antibiotics.  Follow these instructions at home: Abscess Care  If you have an abscess that has not drained, place a warm, clean, wet washcloth over the abscess several times a day. Do this as told by your health care  provider.  Follow instructions from your health care provider about how to take care of your abscess. Make sure you: ? Cover the abscess with a bandage (dressing). ? Change your dressing or gauze as told by your health care provider. ? Wash your hands with soap and water before you change the dressing or gauze. If soap and water are not available, use hand sanitizer.  Check your abscess every day for signs of a worsening infection. Check for: ? More redness, swelling, or pain. ? More fluid or blood. ? Warmth. ? More pus or a bad smell. Medicines  Take over-the-counter and prescription medicines only  as told by your health care provider.  If you were prescribed an antibiotic medicine, take it as told by your health care provider. Do not stop taking the antibiotic even if you start to feel better. General instructions  To avoid spreading the infection: ? Do not share personal care items, towels, or hot tubs with others. ? Avoid making skin contact with other people.  Keep all follow-up visits as told by your health care provider. This is important. Contact a health care provider if:  You have more redness, swelling, or pain around your abscess.  You have more fluid or blood coming from your abscess.  Your abscess feels warm to the touch.  You have more pus or a bad smell coming from your abscess.  You have a fever.  You have muscle aches.  You have chills or a general ill feeling. Get help right away if:  You have severe pain.  You see red streaks on your skin spreading away from the abscess. This information is not intended to replace advice given to you by your health care provider. Make sure you discuss any questions you have with your health care provider. Document Released: 08/06/2005 Document Revised: 06/22/2016 Document Reviewed: 09/05/2015 Elsevier Interactive Patient Education  [employer].

## 2018-10-13 NOTE — Progress Notes (Signed)
Called patient's wife with results and she voices understanding. Patient has a follow up with Dr. Georgina Snell on 10/13/18.

## 2018-10-13 NOTE — Progress Notes (Signed)
John Sharp is a 57 y.o. male who presents to [facility]: [facility] today for septic prepatellar bursitis.  John Sharp has been seen multiple times by my partner Dr. Dianah Field and myself for right knee prepatellar bursitis do originally to pseudogout but subsequently found septic with recent aspiration 2 days ago.  Early culture results show staph aureus.  Sensitivity is not yet back.  His bursitis has reaccumulated become red and painful.  He was prescribed doxycycline today but has not yet picked it up.  He is here today for either repeat aspiration or incision and drainage.  He denies fevers or chills currently.  He notes it is painful and tender to touch.  He had a similar episode of olecranon bursitis ultimately requiring bursectomy in the past.   ROS as above:  Exam:  BP 130/81   Pulse 75   Wt 249 lb (112.9 kg)   BMI 33.77 kg/m  Wt Readings from Last 5 Encounters:  10/13/18 249 lb (112.9 kg)  08/02/18 240 lb (108.9 kg)  07/09/18 240 lb (108.9 kg)  06/10/18 240 lb (108.9 kg)  07/16/17 208 lb (94.3 kg)    Gen: Well NAD HEENT: EOMI,  MMM Lungs: Normal work of breathing. CTABL Heart: RRR no MRG Abd: NABS, Soft. Nondistended, Nontender Exts: Brisk capillary refill, warm and well perfused.  Right knee large erythematous prepatellar bursa with fluctuance.  No drainage.  Tender to touch.  Abscess incision and drainage. Consent obtained and timeout performed. Skin cleaned with alcohol, and cold spray applied. 6 mL of a 50-50 mixture of lidocaine and Marcaine without epinephrine injected achieving good anesthesia. Skin was again cleaned with Betadine.  A sharp incision was made to the area of fluctuance. The incision was widened and pus was expressed.  Amount of reddish thick purulent discharge spontaneously drained.   Blunt dissection was used to break up loculations.  Further pus was expressed. Patient tolerated the procedure well. A large bulky dressing applied and compressed with Ace wrap.   Lab and Radiology Results Results for orders placed or performed in visit on 10/11/18 (from the past 72 hour(s))  Synovial fluid, crystal     Status: None   Collection Time: 10/11/18  2:25 PM  Result Value Ref Range   Site NOT GIVEN    Crystals, Fluid  NONE SEEN /HPF    Comment: . Positive for calcium pyrophosphate crystals . Intracellular   Cell Count + Diff, w/o Cryst, Synvl Fld.     Status: Abnormal   Collection Time: 10/11/18  2:25 PM  Result Value Ref Range   Site NOT GIVEN    Color, Synovial YELLOW STRAW/YELL   Appearance-Synovial TURBID (A) CLEAR/HAZY   WBC, Synovial 8,230 (H) <150 cells/uL   Neutrophil, Synovial 87 (H) 0 - 24 %   Lymphocytes-Synovial Fld 3 0 - 74 %   Monocyte/Macrophage 10 0 - 69 %   Eosinophils-Synovial 0 0 - 2 %   Basophils, % 0 0 %   Synoviocytes, % 0 0 - 15 %  Anaerobic and Aerobic Culture     Status: Abnormal (Preliminary result)   Collection Time: 10/11/18  2:32 PM  Result Value Ref Range   MICRO NUMBER: [id number]    SPECIMEN QUALITY: Adequate    Source: SYNOVIAL FLUID    STATUS: PRELIMINARY    GRAM STAIN: (A)     Many White blood cells seen No epithelial cells seen No organisms seen  Dg Ankle Complete Left  Result Date: 10/11/2018 CLINICAL DATA:  Left ankle pain. EXAM: LEFT ANKLE COMPLETE - 3+ VIEW COMPARISON:  None. FINDINGS: Status post surgical internal fixation of the distal left fibula and medial malleolus. No fracture or dislocation is noted. No soft tissue abnormality is noted. Joint spaces are intact. IMPRESSION: Postsurgical changes as described above. No acute abnormality seen in the left ankle. Electronically Signed   By: Marijo Conception, M.D.   On: 10/11/2018 18:47      Assessment and Plan: 57 y.o. male with  Right knee septic prepatellar bursitis.  Patient has positive culture on aspirate 2 days  ago and has had fluid reaccumulation since.  I am not optimistic about serial aspiration given the prompt reaccumulation of fluid and the severity of his septic bursitis appearance on exam today.  After discussion with patient plan to proceed with incision and drainage of prepatellar bursa abscess.  Plan to recheck with Dr. Dianah Field or myself in 1 week.  Discussed dressing changes.  Start doxycycline as prescribed by Dr. Dianah Field today.    Historical information moved to improve visibility of documentation.  Past Medical History:  Diagnosis Date  . Erectile dysfunction   . GERD (gastroesophageal reflux disease)   . Prepatellar bursitis, right knee 09/06/2018   Past Surgical History:  Procedure Laterality Date  . APPENDECTOMY    . Left ankle repair  2012   Social History   Tobacco Use  . Smoking status: Current Some Day Smoker  . Smokeless tobacco: Never Used  Substance Use Topics  . Alcohol use: Not on file   family history is not on file.  Medications: Current Outpatient Medications  Medication Sig Dispense Refill  . AMBULATORY NON FORMULARY MEDICATION CPAP full face mask and associated equipment. 1 Device 0  . aspirin 325 MG tablet Take 325 mg by mouth daily.    . Ceftriaxone Sodium POWD 1 g powder vials to be reconstituted at home and  injected intramuscular every 12 hours 7 days. Please include 3 mL syringes, 22-gauge 1.5 inch needles, blunt draw needles. 14 g 0  . clotrimazole-betamethasone (LOTRISONE) cream Apply 1 application topically 2 (two) times daily. 30 g 0  . colchicine 0.6 MG tablet 1 tab p.o. twice daily for a week then daily 60 tablet 2  . doxycycline (VIBRA-TABS) 100 MG tablet Take 1 tablet (100 mg total) by mouth 2 (two) times daily for 7 days. 14 tablet 0  . fish oil-omega-3 fatty acids 1000 MG capsule Take 2 g by mouth daily.    . montelukast (SINGULAIR) 10 MG tablet Take 1 tablet (10 mg total) by mouth at bedtime. 90 tablet 3  . Multiple Vitamin  (MULTIVITAMIN) capsule Take 1 capsule by mouth daily.    . pantoprazole (PROTONIX) 40 MG tablet Take 1 tablet (40 mg total) by mouth daily. 90 tablet 3  . tamsulosin (FLOMAX) 0.4 MG CAPS capsule TAKE 1 CAPSULE(0.4 MG) BY MOUTH DAILY 90 capsule 3  . vitamin B-12 (CYANOCOBALAMIN) 250 MCG tablet Take 250 mcg by mouth daily.    . vitamin C (ASCORBIC ACID) 500 MG tablet Take 500 mg by mouth daily.     No current facility-administered medications for this visit.    No Known Allergies   Discussed warning signs or symptoms. Please see discharge instructions. Patient expresses understanding.

## 2018-10-16 LAB — ANAEROBIC AND AEROBIC CULTURE
MICRO NUMBER:: 91439763
MICRO NUMBER:: 91439764
SPECIMEN QUALITY:: ADEQUATE
SPECIMEN QUALITY:: ADEQUATE

## 2018-10-20 ENCOUNTER — Ambulatory Visit (INDEPENDENT_AMBULATORY_CARE_PROVIDER_SITE_OTHER): Admitting: Family Medicine

## 2018-10-20 ENCOUNTER — Encounter: Payer: Self-pay | Admitting: Family Medicine

## 2018-10-20 DIAGNOSIS — M71161 Other infective bursitis, right knee: Secondary | ICD-10-CM

## 2018-10-20 MED ORDER — DOXYCYCLINE HYCLATE 100 MG PO TABS: 100.0000 mg | ORAL_TABLET | Freq: Two times a day (BID) | ORAL | 0 refills | Status: AC

## 2018-10-20 NOTE — Patient Instructions (Addendum)
Thank you for coming in today. Keep the wound covered with ointment until it heals.  Continue compression.  Recheck with me or Dr T as needed.   If it worsens again fill and start taking the doxycycline.   Ok to back down to 1 daily colchicine.

## 2018-10-21 NOTE — Progress Notes (Signed)
John Sharp is a 57 y.o. male who presents to [facility]: [facility] today for follow-up right knee septic prepatellar bursitis.  Patient was seen multiple times by my partner Dr. Dianah Sharp for right knee pain and swelling thought to be prepatellar bursitis due to trauma as well as pseudogout.  He had a fluid aspiration on December 2 which showed bacterial growth.  He saw me on the fourth after fluid reaccumulated and was red and painful and tender.  I proceeded with incision and drainage of septic prepatellar bursitis.  In the interim he has done quite well and notes the redness pain and swelling has almost completely resolved.  He notes a small amount of drainage still is happy with how things are going.  He has been using antibiotic ointment and compression which has been helpful.  He finished his course of oral doxycycline.   ROS as above:  Exam:  BP 127/89   Pulse 99  Wt Readings from Last 5 Encounters:  10/13/18 249 lb (112.9 kg)  08/02/18 240 lb (108.9 kg)  07/09/18 240 lb (108.9 kg)  06/10/18 240 lb (108.9 kg)  07/16/17 208 lb (94.3 kg)    Gen: Well NAD HEENT: EOMI,  MMM Lungs: Normal work of breathing. CTABL Heart: RRR no MRG Abd: NABS, Soft. Nondistended, Nontender Exts: Brisk capillary refill, warm and well perfused.  Right knee well-appearing tiny prepatellar bursa swelling.  No erythema nontender normal motion.  Small amount of yellowish drainage present.  Lab and Radiology Results Anaerobic and Aerobic Culture  Order: [id number]  Status:  Edited Result - FINAL  Visible to patient:  No (Not Released)  Next appt:  None  Dx:  Pseudogout of right prepatellar bursa  Specimen Information: Joint; Synovial Fluid     Component 10d ago  MICRO NUMBER: [id number] P  SPECIMEN QUALITY: Adequate P  Source: SYNOVIAL FLUID P  STATUS: ADDENDUM - PRELIMINARY VC  GRAM  STAIN: Many White blood cells seen No epithelial cells seen No organisms seenAbnormal  P  ANA RESULT: No anaerobes isolated to date, continuing incubation. P  MICRO NUMBER: [id number]   SPECIMEN QUALITY: Adequate   SOURCE: SYNOVIAL FLUID   STATUS: FINAL   AER ISOLATE 1: Staphylococcus aureusAbnormal    Comment: Heavy growth of Staphylococcus aureus This isolate demonstrates inducible clindamycin resistance.  Resulting Agency [facility]  Susceptibility    Staphylococcus aureus    AEROBIC BACTERIA, CULT POSITIVE 1    CIPROFLOXACIN <=0.5  Sensitive    CLINDAMYCIN NR  Resistant    ERYTHROMYCIN >=8  Resistant    GENTAMICIN <=0.5  Sensitive    LEVOFLOXACIN 0.25  Sensitive    OXACILLIN <=0.25  Sensitive1    TETRACYCLINE <=1  Sensitive    TRIMETH/SULFA <=10  Sensitive2    VANCOMYCIN 1  Sensitive         1 Oxacillin-susceptible staphylococci are susceptible to other penicillinase-stable penicillins (e.g. Methicillin, Nafcillin), beta- lactam/beta-lactamase inhibitor combinations, and cephems with staphylococcal indications, including Cefazolin.             Assessment and Plan: 57 y.o. male with right knee prepatellar bursitis status post incision and drainage of septic bursitis on December 4.  Culture did show staph aureus sensitive to doxycycline.  Patient recently completed doxycycline course.  Clinically he is doing quite well.  Plan for watchful waiting with continued compression and dressing changes until the wound heals fully.  If worsening I have printed a backup  prescription for doxycycline patient can recheck with me or Dr. Dianah Sharp in the near future as needed.  I spent 15 minutes with this patient, greater than 50% was face-to-face time counseling regarding differential diagnosis treatment plan and next steps..   No orders of the defined types were placed in this encounter.  Meds ordered this encounter  Medications  . doxycycline (VIBRA-TABS) 100 MG tablet    Sig: Take  1 tablet (100 mg total) by mouth 2 (two) times daily for 10 days.    Dispense:  20 tablet    Refill:  0     Historical information moved to improve visibility of documentation.  Past Medical History:  Diagnosis Date  . Erectile dysfunction   . GERD (gastroesophageal reflux disease)   . Prepatellar bursitis, right knee 09/06/2018   Past Surgical History:  Procedure Laterality Date  . APPENDECTOMY    . Left ankle repair  2012   Social History   Tobacco Use  . Smoking status: Current Some Day Smoker  . Smokeless tobacco: Never Used  Substance Use Topics  . Alcohol use: Not on file   family history is not on file.  Medications: Current Outpatient Medications  Medication Sig Dispense Refill  . AMBULATORY NON FORMULARY MEDICATION CPAP full face mask and associated equipment. 1 Device 0  . aspirin 325 MG tablet Take 325 mg by mouth daily.    . clotrimazole-betamethasone (LOTRISONE) cream Apply 1 application topically 2 (two) times daily. 30 g 0  . colchicine 0.6 MG tablet 1 tab p.o. twice daily for a week then daily 60 tablet 2  . doxycycline (VIBRA-TABS) 100 MG tablet Take 1 tablet (100 mg total) by mouth 2 (two) times daily for 10 days. 20 tablet 0  . fish oil-omega-3 fatty acids 1000 MG capsule Take 2 g by mouth daily.    . montelukast (SINGULAIR) 10 MG tablet Take 1 tablet (10 mg total) by mouth at bedtime. 90 tablet 3  . Multiple Vitamin (MULTIVITAMIN) capsule Take 1 capsule by mouth daily.    . pantoprazole (PROTONIX) 40 MG tablet Take 1 tablet (40 mg total) by mouth daily. 90 tablet 3  . tamsulosin (FLOMAX) 0.4 MG CAPS capsule TAKE 1 CAPSULE(0.4 MG) BY MOUTH DAILY 90 capsule 3  . vitamin B-12 (CYANOCOBALAMIN) 250 MCG tablet Take 250 mcg by mouth daily.    . vitamin C (ASCORBIC ACID) 500 MG tablet Take 500 mg by mouth daily.     No current facility-administered medications for this visit.    No Known Allergies   Discussed warning signs or symptoms. Please see discharge  instructions. Patient expresses understanding.

## 2018-11-08 ENCOUNTER — Ambulatory Visit: Admitting: Sports Medicine

## 2018-11-15 ENCOUNTER — Other Ambulatory Visit: Payer: Self-pay

## 2018-11-15 DIAGNOSIS — J302 Other seasonal allergic rhinitis: Secondary | ICD-10-CM

## 2018-11-15 DIAGNOSIS — K2100 Gastro-esophageal reflux disease with esophagitis, without bleeding: Secondary | ICD-10-CM

## 2018-11-15 MED ORDER — PANTOPRAZOLE SODIUM 40 MG PO TBEC: 40.0000 mg | DELAYED_RELEASE_TABLET | Freq: Every day | ORAL | 3 refills | Status: DC

## 2018-11-15 MED ORDER — MONTELUKAST SODIUM 10 MG PO TABS: 10.0000 mg | ORAL_TABLET | Freq: Every day | ORAL | 3 refills | Status: DC

## 2018-11-15 NOTE — Telephone Encounter (Signed)
Patient's wife came into office today and wanted me to let you know that her husband needed these medication refilled. I have pended both medications for refill. Thanks!

## 2019-01-06 ENCOUNTER — Ambulatory Visit (INDEPENDENT_AMBULATORY_CARE_PROVIDER_SITE_OTHER): Admitting: Family Medicine

## 2019-01-06 VITALS — BP 117/72 | HR 92 | Temp 97.8°F | Ht 72.0 in

## 2019-01-06 DIAGNOSIS — R6889 Other general symptoms and signs: Secondary | ICD-10-CM | POA: Diagnosis not present

## 2019-01-06 DIAGNOSIS — J101 Influenza due to other identified influenza virus with other respiratory manifestations: Secondary | ICD-10-CM

## 2019-01-06 LAB — POCT INFLUENZA A/B
Influenza A, POC: POSITIVE — AB
Influenza B, POC: NEGATIVE

## 2019-01-06 MED ORDER — AZELASTINE HCL 0.1 % NA SOLN: 1.0000 | Freq: Two times a day (BID) | NASAL | 12 refills | Status: DC

## 2019-01-06 MED ORDER — OSELTAMIVIR PHOSPHATE 75 MG PO CAPS: 75.0000 mg | ORAL_CAPSULE | Freq: Two times a day (BID) | ORAL | 0 refills | Status: DC

## 2019-01-06 NOTE — Progress Notes (Signed)
John Sharp is a 58 y.o. male who presents to [facility] today for cough nasal congestion runny nose.  Symptoms present for about a day.  Patient has tried over-the-counter medications which worked pretty well.  His cough is not particularly bothersome.  He denies significant shortness of breath runny nose chest pain or palpitations.  He notes positive sick contacts at work.  He did receive a flu vaccine this year.   ROS as above:  Exam:  BP 117/72   Pulse 92   Temp 97.8 F (36.6 C) (Oral)   Ht 6' (1.829 m)   SpO2 97%   BMI 33.77 kg/m  Wt Readings from Last 5 Encounters:  10/13/18 249 lb (112.9 kg)  08/02/18 240 lb (108.9 kg)  07/09/18 240 lb (108.9 kg)  06/10/18 240 lb (108.9 kg)  07/16/17 208 lb (94.3 kg)    Gen: Well NAD HEENT: EOMI,  MMM clear nasal discharge.  Mildly inflamed nasal turbinates.  Retracted tympanic membranes bilaterally.  Nonerythematous nontender. Lungs: Normal work of breathing. CTABL Heart: RRR no MRG Abd: NABS, Soft. Nondistended, Nontender Exts: Brisk capillary refill, warm and well perfused.   Lab and Radiology Results Results for orders placed or performed in visit on 01/06/19 (from the past 72 hour(s))  POCT Influenza A/B     Status: Abnormal   Collection Time: 01/06/19  9:54 AM  Result Value Ref Range   Influenza A, POC Positive (A) Negative   Influenza B, POC Negative Negative   No results found.    Assessment and Plan: 58 y.o. male with influenza.  Patient has a mild course likely because he has been vaccinated.  Will treat symptomatically with over-the-counter medications and with Astelin nasal spray.  Additionally use Tamiflu.  We will also independently prescribe Tamiflu preventatively for his wife.  Work note provided.  Recheck as needed.  PDMP not reviewed this encounter. Orders Placed This Encounter    Procedures  . POCT Influenza A/B   Meds ordered this encounter  Medications  . azelastine (ASTELIN) 0.1 % nasal spray    Sig: Place 1 spray into both nostrils 2 (two) times daily. Use in each nostril as directed    Dispense:  30 mL    Refill:  12  . oseltamivir (TAMIFLU) 75 MG capsule    Sig: Take 1 capsule (75 mg total) by mouth 2 (two) times daily.    Dispense:  10 capsule    Refill:  0     Historical information moved to improve visibility of documentation.  Past Medical History:  Diagnosis Date  . Erectile dysfunction   . GERD (gastroesophageal reflux disease)   . Prepatellar bursitis, right knee 09/06/2018   Past Surgical History:  Procedure Laterality Date  . APPENDECTOMY    . Left ankle repair  2012   Social History   Tobacco Use  . Smoking status: Current Some Day Smoker  . Smokeless tobacco: Never Used  Substance Use Topics  . Alcohol use: Not on file   family history is not on file.  Medications: Current Outpatient Medications  Medication Sig Dispense Refill  . AMBULATORY NON FORMULARY MEDICATION CPAP full face mask and associated equipment. 1 Device 0  . aspirin 325 MG tablet Take 325 mg by mouth daily.    . clotrimazole-betamethasone (LOTRISONE) cream Apply 1 application topically 2 (two) times daily. 30 g 0  . colchicine 0.6 MG tablet 1 tab p.o. twice  daily for a week then daily 60 tablet 2  . fish oil-omega-3 fatty acids 1000 MG capsule Take 2 g by mouth daily.    . montelukast (SINGULAIR) 10 MG tablet Take 1 tablet (10 mg total) by mouth at bedtime. 90 tablet 3  . Multiple Vitamin (MULTIVITAMIN) capsule Take 1 capsule by mouth daily.    . pantoprazole (PROTONIX) 40 MG tablet Take 1 tablet (40 mg total) by mouth daily. 90 tablet 3  . tamsulosin (FLOMAX) 0.4 MG CAPS capsule TAKE 1 CAPSULE(0.4 MG) BY MOUTH DAILY 90 capsule 3  . vitamin B-12 (CYANOCOBALAMIN) 250 MCG tablet Take 250 mcg by mouth daily.    . vitamin C (ASCORBIC ACID) 500 MG tablet Take  500 mg by mouth daily.    [name] azelastine (ASTELIN) 0.1 % nasal spray Place 1 spray into both nostrils 2 (two) times daily. Use in each nostril as directed 30 mL 12  . oseltamivir (TAMIFLU) 75 MG capsule Take 1 capsule (75 mg total) by mouth 2 (two) times daily. 10 capsule 0   No current facility-administered medications for this visit.    No Known Allergies   Discussed warning signs or symptoms. Please see discharge instructions. Patient expresses understanding.

## 2019-01-06 NOTE — Patient Instructions (Addendum)
Thank you for coming in today. I think this is a respiratory virus.  Continue over the counter medicines as needed.  Add the nasal spray.  Let me know if you worsen or have worse cough.  Call or go to the emergency room if you get worse, have trouble breathing, have chest pains, or palpitations.     Influenza, Adult Influenza, more commonly known as "the flu," is a viral infection that mainly affects the respiratory tract. The respiratory tract includes organs that help you breathe, such as the lungs, nose, and throat. The flu causes many symptoms similar to the common cold along with high fever and body aches. The flu spreads easily from person to person (is contagious). Getting a flu shot (influenza vaccination) every year is the best way to prevent the flu. What are the causes? This condition is caused by the influenza virus. You can get the virus by:  Breathing in droplets that are in the air from an infected person's cough or sneeze.  Touching something that has been exposed to the virus (has been contaminated) and then touching your mouth, nose, or eyes. What increases the risk? The following factors may make you more likely to get the flu:  Not washing or sanitizing your hands often.  Having close contact with many people during cold and flu season.  Touching your mouth, eyes, or nose without first washing or sanitizing your hands.  Not getting a yearly (annual) flu shot. You may have a higher risk for the flu, including serious problems such as a lung infection (pneumonia), if you:  Are older than 65.  Are pregnant.  Have a weakened disease-fighting system (immune system). You may have a weakened immune system if you: ? Have HIV or AIDS. ? Are undergoing chemotherapy. ? Are taking medicines that reduce (suppress) the activity of your immune system.  Have a long-term (chronic) illness, such as heart disease, kidney disease, diabetes, or lung disease.  Have a liver  disorder.  Are severely overweight (morbidly obese).  Have anemia. This is a condition that affects your red blood cells.  Have asthma. What are the signs or symptoms? Symptoms of this condition usually begin suddenly and last 4-14 days. They may include:  Fever and chills.  Headaches, body aches, or muscle aches.  Sore throat.  Cough.  Runny or stuffy (congested) nose.  Chest discomfort.  Poor appetite.  Weakness or fatigue.  Dizziness.  Nausea or vomiting. How is this diagnosed? This condition may be diagnosed based on:  Your symptoms and medical history.  A physical exam.  Swabbing your nose or throat and testing the fluid for the influenza virus. How is this treated? If the flu is diagnosed early, you can be treated with medicine that can help reduce how severe the illness is and how long it lasts (antiviral medicine). This may be given by mouth (orally) or through an IV. Taking care of yourself at home can help relieve symptoms. Your health care provider may recommend:  Taking over-the-counter medicines.  Drinking plenty of fluids. In many cases, the flu goes away on its own. If you have severe symptoms or complications, you may be treated in a hospital. Follow these instructions at home: Activity  Rest as needed and get plenty of sleep.  Stay home from work or school as told by your health care provider. Unless you are visiting your health care provider, avoid leaving home until your fever has been gone for 24 hours without taking medicine.  Eating and drinking  Take an oral rehydration solution (ORS). This is a drink that is sold at pharmacies and retail stores.  Drink enough fluid to keep your urine pale yellow.  Drink clear fluids in small amounts as you are able. Clear fluids include water, ice chips, diluted fruit juice, and low-calorie sports drinks.  Eat bland, easy-to-digest foods in small amounts as you are able. These foods include bananas,  applesauce, rice, lean meats, toast, and crackers.  Avoid drinking fluids that contain a lot of sugar or caffeine, such as energy drinks, regular sports drinks, and soda.  Avoid alcohol.  Avoid spicy or fatty foods. General instructions      Take over-the-counter and prescription medicines only as told by your health care provider.  Use a cool mist humidifier to add humidity to the air in your home. This can make it easier to breathe.  Cover your mouth and nose when you cough or sneeze.  Wash your hands with soap and water often, especially after you cough or sneeze. If soap and water are not available, use alcohol-based hand sanitizer.  Keep all follow-up visits as told by your health care provider. This is important. How is this prevented?   Get an annual flu shot. You may get the flu shot in late summer, fall, or winter. Ask your health care provider when you should get your flu shot.  Avoid contact with people who are sick during cold and flu season. This is generally fall and winter. Contact a health care provider if:  You develop new symptoms.  You have: ? Chest pain. ? Diarrhea. ? A fever.  Your cough gets worse.  You produce more mucus.  You feel nauseous or you vomit. Get help right away if:  You develop shortness of breath or difficulty breathing.  Your skin or nails turn a bluish color.  You have severe pain or stiffness in your neck.  You develop a sudden headache or sudden pain in your face or ear.  You cannot eat or drink without vomiting. Summary  Influenza, more commonly known as "the flu," is a viral infection that primarily affects your respiratory tract.  Symptoms of the flu usually begin suddenly and last 4-14 days.  Getting an annual flu shot is the best way to prevent getting the flu.  Stay home from work or school as told by your health care provider. Unless you are visiting your health care provider, avoid leaving home until your  fever has been gone for 24 hours without taking medicine.  Keep all follow-up visits as told by your health care provider. This is important. This information is not intended to replace advice given to you by your health care provider. Make sure you discuss any questions you have with your health care provider. Document Released: 10/24/2000 Document Revised: 04/14/2018 Document Reviewed: 04/14/2018 Elsevier Interactive Patient Education  2019 [employer].

## 2019-01-31 ENCOUNTER — Other Ambulatory Visit: Payer: Self-pay | Admitting: Sports Medicine

## 2019-01-31 DIAGNOSIS — M11261 Other chondrocalcinosis, right knee: Secondary | ICD-10-CM

## 2019-01-31 MED ORDER — COLCHICINE 0.6 MG PO TABS: ORAL_TABLET | ORAL | 2 refills | Status: DC

## 2019-02-01 ENCOUNTER — Telehealth: Payer: Self-pay | Admitting: Physician Assistant

## 2019-02-01 ENCOUNTER — Ambulatory Visit (INDEPENDENT_AMBULATORY_CARE_PROVIDER_SITE_OTHER): Admitting: Physician Assistant

## 2019-02-01 ENCOUNTER — Other Ambulatory Visit: Payer: Self-pay

## 2019-02-01 VITALS — BP 128/88 | HR 87

## 2019-02-01 DIAGNOSIS — M542 Cervicalgia: Secondary | ICD-10-CM | POA: Diagnosis not present

## 2019-02-01 MED ORDER — PREDNISONE 50 MG PO TABS: ORAL_TABLET | ORAL | 0 refills | Status: DC

## 2019-02-01 NOTE — Telephone Encounter (Signed)
Appointment has been scheduled for today. No further questions at this time.

## 2019-02-01 NOTE — Telephone Encounter (Signed)
Wife called stating that patient might have a pinched nerve on the left side of neck. This is has been going on for 4 days. Would like to know if patient needs to come in or if they can do a telephone visit. Please advise.

## 2019-02-01 NOTE — Progress Notes (Signed)
..  Virtual Visit via Telephone Note  I connected with John Sharp on 02/02/19 at  4:00 PM EDT by telephone and verified that I am speaking with the correct person using two identifiers.   I discussed the limitations, risks, security and privacy concerns of performing an evaluation and management service by telephone and the availability of in person appointments. I also discussed with the patient that there may be a patient responsible charge related to this service. The patient expressed understanding and agreed to proceed.    History of Present Illness: Pt is a 58 yo male who calls into office with left sided neck pain. Pain started 2-3 days ago and not improving. Rates 4-7 ranging throughout the day. No known injury. Denies any heavy lifting. Denies any radiation of pain or numbness and tingling into extremities. No weakness of upper arm strength. He is using tylenol and voltaren gel with heating pad with little relief. Hurts to turn his he to the right.   Pt had some neck pain in 2014. Normal xray of cspine.   .. Active Ambulatory Problems    Diagnosis Date Noted  . 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 GERD (gastroesophageal reflux disease) 05/13/2012  . Erectile dysfunction 05/24/2012  . Moderate obstructive sleep apnea 06/07/2012  . Depression 07/29/2013  . Medial epicondylitis of right elbow 03/10/2014  . Primary osteoarthritis of both knees 04/28/2014  . Essential tremor 09/19/2016  . Swelling of arm 06/16/2017  . Trochanteric bursitis, left hip 06/16/2017  . Obstructive uropathy 06/16/2017  . Seasonal allergies 06/10/2018  . Pseudogout of right prepatellar bursa 09/06/2018  . Status post ORIF of fracture of ankle 10/11/2018  . Septic prepatellar bursitis of right knee 10/13/2018   Resolved Ambulatory Problems    Diagnosis Date Noted  . Cough 01/06/2014  . Bilateral ankle pain 05/10/2014  . Elevated PSA 01/08/2015  . Increased laboratory test result 01/08/2015  . No energy  08/03/2015   Past Medical History:  Diagnosis Date  . Prepatellar bursitis, right knee 09/06/2018   Review of med, allergy and problem list.    Observations/Objective: No acute distress  .[name] Today's Vitals   02/01/19 1633  BP: 128/88  Pulse: 87   There is no height or weight on file to calculate BMI.   Assessment and Plan: Marland KitchenMarland KitchenDiagnoses and all orders for this visit:  Neck pain -     predniSONE (DELTASONE) 50 MG tablet; One tab PO daily for 5 days.   No red flag symptoms. No radiculopathy. Treated with burst of prednisone. Discussed symptomatic care with exercises, heat, ice, biofreeze, icy hot patches, tens unit. Consider massage but right now closed down due to covid. Follow up in office with any arm weakness, numbness or tingling down arm. Avoid NSAIDs due to GERD. Continue with tylenol.   Follow Up Instructions:    I discussed the assessment and treatment plan with the patient. The patient was provided an opportunity to ask questions and all were answered. The patient agreed with the plan and demonstrated an understanding of the instructions.   The patient was advised to call back or seek an in-person evaluation if the symptoms worsen or if the condition fails to improve as anticipated.  I provided 15 minutes of non-face-to-face time during this encounter.   Iran Planas, PA-C

## 2019-02-01 NOTE — Telephone Encounter (Signed)
We can start with telephone visit but may need office visit at some point.

## 2019-02-02 ENCOUNTER — Encounter: Payer: Self-pay | Admitting: Physician Assistant

## 2019-02-08 ENCOUNTER — Other Ambulatory Visit: Payer: Self-pay | Admitting: Physician Assistant

## 2019-02-08 MED ORDER — AZELASTINE HCL 0.1 % NA SOLN: 1.0000 | Freq: Two times a day (BID) | NASAL | 0 refills | Status: DC

## 2019-02-11 ENCOUNTER — Other Ambulatory Visit: Payer: Self-pay | Admitting: Sports Medicine

## 2019-02-14 ENCOUNTER — Other Ambulatory Visit: Payer: Self-pay | Admitting: Sports Medicine

## 2019-02-14 DIAGNOSIS — M11261 Other chondrocalcinosis, right knee: Secondary | ICD-10-CM

## 2019-04-06 ENCOUNTER — Encounter: Payer: Self-pay | Admitting: Physician Assistant

## 2019-04-06 ENCOUNTER — Ambulatory Visit (INDEPENDENT_AMBULATORY_CARE_PROVIDER_SITE_OTHER): Admitting: Physician Assistant

## 2019-04-06 VITALS — BP 139/87 | HR 87 | Temp 98.7°F | Ht 72.0 in | Wt 229.0 lb

## 2019-04-06 DIAGNOSIS — M542 Cervicalgia: Secondary | ICD-10-CM | POA: Insufficient documentation

## 2019-04-06 DIAGNOSIS — K2100 Gastro-esophageal reflux disease with esophagitis, without bleeding: Secondary | ICD-10-CM

## 2019-04-06 MED ORDER — PREDNISONE 50 MG PO TABS: ORAL_TABLET | ORAL | 0 refills | Status: DC

## 2019-04-06 NOTE — Patient Instructions (Signed)
Neck Exercises  Neck exercises can be important for many reasons:   They can help you to improve and maintain flexibility in your neck. This can be especially important as you age.   They can help to make your neck stronger. This can make movement easier.   They can reduce or prevent neck pain.   They may help your upper back.  Ask your health care provider which neck exercises would be best for you.  Exercises to improve neck flexibility  Neck stretch  Repeat this exercise 3-5 times.  1. Do this exercise while standing or while sitting in a chair.  2. Place your feet flat on the floor, shoulder-width apart.  3. Slowly turn your head to the right. Turn it all the way to the right so you can look over your right shoulder. Do not tilt or tip your head.  4. Hold this position for 10-30 seconds.  5. Slowly turn your head to the left, to look over your left shoulder.  6. Hold this position for 10-30 seconds.    Neck retraction  Repeat this exercise 8-10 times. Do this 3-4 times a day or as told by your health care provider.  1. Do this exercise while standing or while sitting in a sturdy chair.  2. Look straight ahead. Do not bend your neck.  3. Use your fingers to push your chin backward. Do not bend your neck for this movement. Continue to face straight ahead. If you are doing the exercise properly, you will feel a slight sensation in your throat and a stretch at the back of your neck.  4. Hold the stretch for 1-2 seconds. Relax and repeat.  Exercises to improve neck strength  Neck press  Repeat this exercise 10 times. Do it first thing in the morning and right before bed or as told by your health care provider.  1. Lie on your back on a firm bed or on the floor with a pillow under your head.  2. Use your neck muscles to push your head down on the pillow and straighten your spine.  3. Hold the position as well as you can. Keep your head facing up and your chin tucked.  4. Slowly count to 5 while holding this  position.  5. Relax for a few seconds. Then repeat.  Isometric strengthening  Do a full set of these exercises 2 times a day or as told by your health care provider.  1. Sit in a supportive chair and place your hand on your forehead.  2. Push forward with your head and neck while pushing back with your hand. Hold for 10 seconds.  3. Relax. Then repeat the exercise 3 times.  4. Next, do thesequence again, this time putting your hand against the back of your head. Use your head and neck to push backward against the hand pressure.  5. Finally, do the same exercise on either side of your head, pushing sideways against the pressure of your hand.  Prone head lifts  Repeat this exercise 5 times. Do this 2 times a day or as told by your health care provider.  1. Lie face-down, resting on your elbows so that your chest and upper back are raised.  2. Start with your head facing downward, near your chest. Position your chin either on or near your chest.  3. Slowly lift your head upward. Lift until you are looking straight ahead. Then continue lifting your head as far back as you   can stretch.  4. Hold your head up for 5 seconds. Then slowly lower it to your starting position.  Supine head lifts  Repeat this exercise 8-10 times. Do this 2 times a day or as told by your health care provider.  1. Lie on your back, bending your knees to point to the ceiling and keeping your feet flat on the floor.  2. Lift your head slowly off the floor, raising your chin toward your chest.  3. Hold for 5 seconds.  4. Relax and repeat.  Scapular retraction  Repeat this exercise 5 times. Do this 2 times a day or as told by your health care provider.  1. Stand with your arms at your sides. Look straight ahead.  2. Slowly pull both shoulders backward and downward until you feel a stretch between your shoulder blades in your upper back.  3. Hold for 10-30 seconds.  4. Relax and repeat.  Contact a health care provider if:   Your neck pain or  discomfort gets much worse when you do an exercise.   Your neck pain or discomfort does not improve within 2 hours after you exercise.  If you have any of these problems, stop exercising right away. Do not do the exercises again unless your health care provider says that you can.  Get help right away if:   You develop sudden, severe neck pain. If this happens, stop exercising right away. Do not do the exercises again unless your health care provider says that you can.  This information is not intended to replace advice given to you by your health care provider. Make sure you discuss any questions you have with your health care provider.  Document Released: 10/08/2015 Document Revised: 03/02/2018 Document Reviewed: 05/07/2015  Elsevier Interactive Patient Education  2019 [employer]

## 2019-04-06 NOTE — Progress Notes (Signed)
   Subjective:    Patient ID: John Sharp, male    DOB: 08-Nov-1961, 58 y.o.   MRN: [id number]  HPI  Pt is a 58 yo male who presents to the clinic with left sided neck pain since yesterday. He admits he slept on 3 pillows and likely the culprit. She slept on 3 pillows to help with his reflux after eating really bad for memorial day weekend parties. He had similar neck pain in march and prednisone worked really well. He denies any known injury. He denies any arm weakness or numbness and tingling. He has not tried anything to make better.   .. Active Ambulatory Problems    Diagnosis Date Noted  . 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 GERD (gastroesophageal reflux disease) 05/13/2012  . Erectile dysfunction 05/24/2012  . Moderate obstructive sleep apnea 06/07/2012  . Depression 07/29/2013  . Medial epicondylitis of right elbow 03/10/2014  . Primary osteoarthritis of both knees 04/28/2014  . Essential tremor 09/19/2016  . Swelling of arm 06/16/2017  . Trochanteric bursitis, left hip 06/16/2017  . Obstructive uropathy 06/16/2017  . Seasonal allergies 06/10/2018  . Pseudogout of right prepatellar bursa 09/06/2018  . Status post ORIF of fracture of ankle 10/11/2018  . Septic prepatellar bursitis of right knee 10/13/2018  . Musculoskeletal neck pain 04/06/2019   Resolved Ambulatory Problems    Diagnosis Date Noted  . Cough 01/06/2014  . Bilateral ankle pain 05/10/2014  . Elevated PSA 01/08/2015  . Increased laboratory test result 01/08/2015  . No energy 08/03/2015   Past Medical History:  Diagnosis Date  . Prepatellar bursitis, right knee 09/06/2018     Review of Systems  All other systems reviewed and are negative.      Objective:   Physical Exam Vitals signs reviewed.  Constitutional:      Appearance: Normal appearance.  HENT:     Head: Normocephalic and atraumatic.  Musculoskeletal:     Comments: Decreased ROM to the right due to pain.  No tenderness over c-spine.  Tight  left sided paraspinal muscles.   Neurological:     General: No focal deficit present.     Mental Status: He is alert and oriented to person, place, and time.  Psychiatric:        Mood and Affect: Mood normal.        Behavior: Behavior normal.           Assessment & Plan:  Marland KitchenMarland KitchenVivek was seen today for neck pain.  Diagnoses and all orders for this visit:  Musculoskeletal neck pain -     predniSONE (DELTASONE) 50 MG tablet; One tab PO daily for 5 days.  Gastroesophageal reflux disease with esophagitis   Pt's wife has a muscle relaxer he will use that. Prednisone burst given.discussed neck stretches. Consider using tens unit at home. Wear icy hot patch over area. Consider massage if and when open due to [place].  Avoid NSAIDs due to GERD. Instructed to take pepcid before bed on nights where he has eaten poorly to control GERD. Follow up as needed.

## 2019-05-30 ENCOUNTER — Ambulatory Visit: Admitting: Physician Assistant

## 2019-06-01 ENCOUNTER — Telehealth: Payer: Self-pay | Admitting: Physician Assistant

## 2019-06-01 ENCOUNTER — Ambulatory Visit: Admitting: Physician Assistant

## 2019-06-01 DIAGNOSIS — Z13220 Encounter for screening for lipoid disorders: Secondary | ICD-10-CM

## 2019-06-01 DIAGNOSIS — Z0000 Encounter for general adult medical examination without abnormal findings: Secondary | ICD-10-CM

## 2019-06-01 DIAGNOSIS — Z131 Encounter for screening for diabetes mellitus: Secondary | ICD-10-CM

## 2019-06-01 DIAGNOSIS — Z79899 Other long term (current) drug therapy: Secondary | ICD-10-CM

## 2019-06-01 DIAGNOSIS — Z125 Encounter for screening for malignant neoplasm of prostate: Secondary | ICD-10-CM

## 2019-06-02 NOTE — Telephone Encounter (Signed)
Requested labs

## 2019-06-07 LAB — PSA: PSA: 2.4 ng/mL (ref <=4.0)

## 2019-06-07 LAB — CBC WITH DIFFERENTIAL/PLATELET
Absolute Monocytes: 376 cells/uL (ref 200–950)
Basophils Absolute: 28 cells/uL (ref 0–200)
Basophils Relative: 0.6 %
Eosinophils Absolute: 42 cells/uL (ref 15–500)
Eosinophils Relative: 0.9 %
HCT: 45.8 % (ref 38.5–50.0)
Hemoglobin: 15.5 g/dL (ref 13.2–17.1)
Lymphs Abs: 1636 cells/uL (ref 850–3900)
MCH: 31.3 pg (ref 27.0–33.0)
MCHC: 33.8 g/dL (ref 32.0–36.0)
MCV: 92.3 fL (ref 80.0–100.0)
MPV: 10.8 fL (ref 7.5–12.5)
Monocytes Relative: 8 %
Neutro Abs: 2618 cells/uL (ref 1500–7800)
Neutrophils Relative %: 55.7 %
Platelets: 207 10*3/uL (ref 140–400)
RBC: 4.96 10*6/uL (ref 4.20–5.80)
RDW: 12.3 % (ref 11.0–15.0)
Total Lymphocyte: 34.8 %
WBC: 4.7 10*3/uL (ref 3.8–10.8)

## 2019-06-07 LAB — COMPLETE METABOLIC PANEL WITH GFR
AG Ratio: 1.8 (calc) (ref 1.0–2.5)
ALT: 21 U/L (ref 9–46)
AST: 21 U/L (ref 10–35)
Albumin: 4.4 g/dL (ref 3.6–5.1)
Alkaline phosphatase (APISO): 61 U/L (ref 35–144)
BUN: 16 mg/dL (ref 7–25)
CO2: 26 mmol/L (ref 20–32)
Calcium: 9.5 mg/dL (ref 8.6–10.3)
Chloride: 102 mmol/L (ref 98–110)
Creat: 1.08 mg/dL (ref 0.70–1.33)
GFR, Est African American: 88 mL/min/{1.73_m2} (ref >=60)
GFR, Est Non African American: 76 mL/min/{1.73_m2} (ref >=60)
Globulin: 2.5 g/dL (calc) (ref 1.9–3.7)
Glucose, Bld: 105 mg/dL — ABNORMAL HIGH (ref 65–99)
Potassium: 4.1 mmol/L (ref 3.5–5.3)
Sodium: 140 mmol/L (ref 135–146)
Total Bilirubin: 0.6 mg/dL (ref 0.2–1.2)
Total Protein: 6.9 g/dL (ref 6.1–8.1)

## 2019-06-07 LAB — LIPID PANEL W/REFLEX DIRECT LDL
Cholesterol: 184 mg/dL (ref <200)
HDL: 48 mg/dL (ref >=40)
LDL Cholesterol (Calc): 109 mg/dL (calc) — ABNORMAL HIGH
Non-HDL Cholesterol (Calc): 136 mg/dL (calc) — ABNORMAL HIGH (ref <130)
Total CHOL/HDL Ratio: 3.8 (calc) (ref <5.0)
Triglycerides: 154 mg/dL — ABNORMAL HIGH (ref <150)

## 2019-06-07 LAB — HEMOGLOBIN A1C
Hgb A1c MFr Bld: 5.4 % of total Hgb (ref <5.7)
Mean Plasma Glucose: 108 (calc)
eAG (mmol/L): 6 (calc)

## 2019-06-07 NOTE — Telephone Encounter (Signed)
Call pt: cholesterol looks pretty good. TG up just a hair. Exercise and fish oil could help with that. a1c is a little better than last recheck, not a diabetic. Kidney and liver look great. PSA up a little from one year ago but in normal range. 10 year CV risk is 7.3 percent. AHA does not recommend statin therapy until 7.5 percent. Find out what eye doctor he went to. I would like to request last eye report of changes in his eye and needs to do labs.

## 2019-06-22 ENCOUNTER — Ambulatory Visit (INDEPENDENT_AMBULATORY_CARE_PROVIDER_SITE_OTHER): Admitting: Sports Medicine

## 2019-06-22 ENCOUNTER — Other Ambulatory Visit: Payer: Self-pay

## 2019-06-22 ENCOUNTER — Ambulatory Visit (INDEPENDENT_AMBULATORY_CARE_PROVIDER_SITE_OTHER)

## 2019-06-22 DIAGNOSIS — M5412 Radiculopathy, cervical region: Secondary | ICD-10-CM | POA: Diagnosis not present

## 2019-06-22 MED ORDER — PREDNISONE 50 MG PO TABS: ORAL_TABLET | ORAL | 0 refills | Status: DC

## 2019-06-22 NOTE — Progress Notes (Signed)
Subjective:    CC: Neck pain  HPI: This is a pleasant 58 year old male, he has pain in the left side of his neck, with numbness and tingling radiating down to the left upper shoulder and upper arm but not past the elbow.  Worse with reaching out with his elbow extended and arm abducted.  No progressive weakness, no trauma, no constitutional symptoms.  Symptoms are moderate, persistent.  I reviewed the past medical history, family history, social history, surgical history, and allergies today and no changes were needed.  Please see the problem list section below in epic for further details.  Past Medical History: Past Medical History:  Diagnosis Date  . Erectile dysfunction   . GERD (gastroesophageal reflux disease)   . Prepatellar bursitis, right knee 09/06/2018   Past Surgical History: Past Surgical History:  Procedure Laterality Date  . APPENDECTOMY    . Left ankle repair  2012   Social History: Social History   Socioeconomic History  . Marital status: Married    Spouse name: Not on file  . Number of children: Not on file  . Years of education: Not on file  . Highest education level: Not on file  Occupational History  . Not on file  Social Needs  . Financial resource strain: Not on file  . Food insecurity    Worry: Not on file    Inability: Not on file  . Transportation needs    Medical: Not on file    Non-medical: Not on file  Tobacco Use  . Smoking status: Current Some Day Smoker  . Smokeless tobacco: Never Used  Substance and Sexual Activity  . Alcohol use: Not on file  . Drug use: Not on file  . Sexual activity: Not on file  Lifestyle  . Physical activity    Days per week: Not on file    Minutes per session: Not on file  . Stress: Not on file  Relationships  . Social Herbalist on phone: Not on file    Gets together: Not on file    Attends religious service: Not on file    Active member of club or organization: Not on file    Attends  meetings of clubs or organizations: Not on file    Relationship status: Not on file  Other Topics Concern  . Not on file  Social History Narrative  . Not on file   Family History: No family history on file. Allergies: No Known Allergies Medications: See med rec.  Review of Systems: No fevers, chills, night sweats, weight loss, chest pain, or shortness of breath.   Objective:    General: Well Developed, well nourished, and in no acute distress.  Neuro: Alert and oriented x3, extra-ocular muscles intact, sensation grossly intact.  HEENT: Normocephalic, atraumatic, pupils equal round reactive to light, neck supple, no masses, no lymphadenopathy, thyroid nonpalpable.  Skin: Warm and dry, no rashes. Cardiac: Regular rate and rhythm, no murmurs rubs or gallops, no lower extremity edema.  Respiratory: Clear to auscultation bilaterally. Not using accessory muscles, speaking in full sentences. Neck: Negative spurling's Full neck range of motion Grip strength and sensation normal in bilateral hands Strength good C4 to T1 distribution No sensory change to C4 to T1 Reflexes normal  Impression and Recommendations:    Radiculitis of left cervical region Left C4, C5 radiculitis with radicular nerve involvement as well. 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 Starting conservatively, x-rays, prednisone,  rehabilitation exercises given. Return in 4 to 6 weeks, MRI for interventional planning if no better.   ___________________________________________ Gwen Her. Dianah Field, M.D., ABFM., CAQSM. Primary Care and Sports Medicine [facility]  Adjunct Professor of [place] [employer] [facility] [employer]

## 2019-06-22 NOTE — Assessment & Plan Note (Signed)
Left C4, C5 radiculitis with radicular nerve involvement as well. 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 Starting conservatively, x-rays, prednisone, rehabilitation exercises given. Return in 4 to 6 weeks, MRI for interventional planning if no better.

## 2019-08-03 ENCOUNTER — Encounter: Payer: Self-pay | Admitting: Sports Medicine

## 2019-08-03 ENCOUNTER — Other Ambulatory Visit: Payer: Self-pay

## 2019-08-03 ENCOUNTER — Ambulatory Visit (INDEPENDENT_AMBULATORY_CARE_PROVIDER_SITE_OTHER): Admitting: Sports Medicine

## 2019-08-03 VITALS — BP 105/70 | HR 84

## 2019-08-03 DIAGNOSIS — M5412 Radiculopathy, cervical region: Secondary | ICD-10-CM | POA: Diagnosis not present

## 2019-08-03 DIAGNOSIS — Z23 Encounter for immunization: Secondary | ICD-10-CM | POA: Diagnosis not present

## 2019-08-03 NOTE — Assessment & Plan Note (Addendum)
C4 and C5, greater than 6 weeks of physician directed conservative measures, proceeding with MRI for epidural planning.  MRI shows multilevel cervical spondylosis. Left-sided cervical epidural ordered.

## 2019-08-03 NOTE — Progress Notes (Addendum)
  Subjective:    CC: Follow-up  HPI: John Sharp returns, he is a pleasant 58 year old male with left-sided cervical radiculitis, x-rays showed multilevel DDD, conservative measures did not work, at this point he has failed greater than 6 weeks of physician directed conservative measures.  I reviewed the past medical history, family history, social history, surgical history, and allergies today and no changes were needed.  Please see the problem list section below in epic for further details.  Past Medical History: Past Medical History:  Diagnosis Date  . Erectile dysfunction   . GERD (gastroesophageal reflux disease)   . Prepatellar bursitis, right knee 09/06/2018   Past Surgical History: Past Surgical History:  Procedure Laterality Date  . APPENDECTOMY    . Left ankle repair  2012   Social History: Social History   Socioeconomic History  . Marital status: Married    Spouse name: Not on file  . Number of children: Not on file  . Years of education: Not on file  . Highest education level: Not on file  Occupational History  . Not on file  Social Needs  . Financial resource strain: Not on file  . Food insecurity    Worry: Not on file    Inability: Not on file  . Transportation needs    Medical: Not on file    Non-medical: Not on file  Tobacco Use  . Smoking status: Current Some Day Smoker  . Smokeless tobacco: Never Used  Substance and Sexual Activity  . Alcohol use: Not on file  . Drug use: Not on file  . Sexual activity: Not on file  Lifestyle  . Physical activity    Days per week: Not on file    Minutes per session: Not on file  . Stress: Not on file  Relationships  . Social Herbalist on phone: Not on file    Gets together: Not on file    Attends religious service: Not on file    Active member of club or organization: Not on file    Attends meetings of clubs or organizations: Not on file    Relationship status: Not on file  Other Topics Concern  .  Not on file  Social History Narrative  . Not on file   Family History: No family history on file. Allergies: No Known Allergies Medications: See med rec.  Review of Systems: No fevers, chills, night sweats, weight loss, chest pain, or shortness of breath.   Objective:    General: Well Developed, well nourished, and in no acute distress.  Neuro: Alert and oriented x3, extra-ocular muscles intact, sensation grossly intact.  HEENT: Normocephalic, atraumatic, pupils equal round reactive to light, neck supple, no masses, no lymphadenopathy, thyroid nonpalpable.  Skin: Warm and dry, no rashes. Cardiac: Regular rate and rhythm, no murmurs rubs or gallops, no lower extremity edema.  Respiratory: Clear to auscultation bilaterally. Not using accessory muscles, speaking in full sentences.  Impression and Recommendations:    Radiculitis of left cervical region C4 and C5, greater than 6 weeks of physician directed conservative measures, proceeding with MRI for epidural planning.  MRI shows multilevel cervical spondylosis. Left-sided cervical epidural ordered.   ___________________________________________ Gwen Her. Dianah Field, M.D., ABFM., CAQSM. Primary Care and Sports Medicine [place] [facility]  Adjunct Professor of [place] [employer] [facility] [employer]

## 2019-08-08 ENCOUNTER — Other Ambulatory Visit: Payer: Self-pay

## 2019-08-08 ENCOUNTER — Telehealth: Payer: Self-pay | Admitting: Sports Medicine

## 2019-08-08 ENCOUNTER — Ambulatory Visit

## 2019-08-08 MED ORDER — TRIAZOLAM 0.25 MG PO TABS: ORAL_TABLET | ORAL | 0 refills | Status: DC

## 2019-08-08 NOTE — Telephone Encounter (Signed)
Pt needs premeds for MRI due to claustrophobia. Routing.  

## 2019-08-08 NOTE — Telephone Encounter (Signed)
Bailen too?!  Triazolam sent in.

## 2019-08-09 NOTE — Telephone Encounter (Signed)
Left VM with status update.  

## 2019-08-14 ENCOUNTER — Other Ambulatory Visit: Payer: Self-pay

## 2019-08-14 ENCOUNTER — Ambulatory Visit (INDEPENDENT_AMBULATORY_CARE_PROVIDER_SITE_OTHER)

## 2019-08-14 DIAGNOSIS — M5412 Radiculopathy, cervical region: Secondary | ICD-10-CM | POA: Diagnosis not present

## 2019-08-17 ENCOUNTER — Other Ambulatory Visit: Payer: Self-pay | Admitting: Physician Assistant

## 2019-08-17 NOTE — Addendum Note (Signed)
Addended by: Silverio Decamp on: 08/17/2019 04:34 PM   Modules accepted: Orders

## 2019-08-29 ENCOUNTER — Other Ambulatory Visit

## 2019-09-05 ENCOUNTER — Other Ambulatory Visit: Payer: Self-pay

## 2019-09-05 ENCOUNTER — Ambulatory Visit
Admission: RE | Admit: 2019-09-05 | Discharge: 2019-09-05 | Disposition: A | Discharge status: Home / Self Care | Source: Ambulatory Visit | Attending: Sports Medicine | Admitting: Sports Medicine

## 2019-09-05 MED ORDER — IOPAMIDOL (ISOVUE-M 200) INJECTION 41%
1.0000 mL | Freq: Once | INTRAMUSCULAR | Status: AC
  Administered 2019-09-05: 1 mL via EPIDURAL

## 2019-09-05 MED ORDER — METHYLPREDNISOLONE ACETATE 40 MG/ML INJ SUSP (RADIOLOG
120.0000 mg | Freq: Once | INTRAMUSCULAR | Status: AC
  Administered 2019-09-05: 120 mg via EPIDURAL

## 2019-09-05 NOTE — Discharge Instructions (Signed)

## 2019-11-14 ENCOUNTER — Other Ambulatory Visit: Payer: Self-pay | Admitting: Physician Assistant

## 2019-11-14 DIAGNOSIS — K219 Gastro-esophageal reflux disease without esophagitis: Secondary | ICD-10-CM

## 2019-11-14 NOTE — Progress Notes (Signed)
pts wife calls in stating GERD is much worse and he is vomiting acid. He is taking protonix and her carafate. Wants referral. Colonoscopy due for this year as well.

## 2019-11-28 ENCOUNTER — Other Ambulatory Visit: Payer: Self-pay | Admitting: Physician Assistant

## 2019-11-28 DIAGNOSIS — N139 Obstructive and reflux uropathy, unspecified: Secondary | ICD-10-CM

## 2019-12-02 ENCOUNTER — Ambulatory Visit (INDEPENDENT_AMBULATORY_CARE_PROVIDER_SITE_OTHER): Admitting: Physician Assistant

## 2019-12-02 DIAGNOSIS — Z209 Contact with and (suspected) exposure to unspecified communicable disease: Secondary | ICD-10-CM

## 2019-12-02 NOTE — Progress Notes (Signed)
Pt is here for drive up Covid swab test. Pt tolerated swab procedure without any complications. Pt aware provider will informed pt of results when available.

## 2019-12-04 LAB — NOVEL CORONAVIRUS, NAA: SARS-CoV-2, NAA: NOT DETECTED

## 2019-12-06 ENCOUNTER — Encounter: Payer: Self-pay | Admitting: Physician Assistant

## 2019-12-06 ENCOUNTER — Telehealth (INDEPENDENT_AMBULATORY_CARE_PROVIDER_SITE_OTHER): Admitting: Physician Assistant

## 2019-12-06 VITALS — Temp 97.0°F | Ht 72.0 in | Wt 229.0 lb

## 2019-12-06 DIAGNOSIS — H9202 Otalgia, left ear: Secondary | ICD-10-CM

## 2019-12-06 MED ORDER — METHYLPREDNISOLONE 4 MG PO TBPK: ORAL_TABLET | ORAL | 0 refills | Status: DC

## 2019-12-06 NOTE — Progress Notes (Deleted)
Left ear pain/pressure X 1 week Has tried sudafed and liquid ear cleanser - Didn't help Pressure gets better, then comes back No recent flying or swimming No other symptoms

## 2019-12-06 NOTE — Progress Notes (Addendum)
Patient ID: John Sharp, male   DOB: [birth year], 59 y.o.   MRN: [id number] .John KitchenVirtual Visit via Video Note  I connected with John Sharp on 12/06/19 at  3:40 PM EST by a video enabled telemedicine application and verified that I am speaking with the correct person using two identifiers.  Location: Patient: home Provider: clinic   I discussed the limitations of evaluation and management by telemedicine and the availability of in person appointments. The patient expressed understanding and agreed to proceed.  History of Present Illness: Pt is a 59 yo male who calls into the clinic with left ear pain for last week. Last week he had some URI symptoms and tested negative for covid. All other symptoms have resolved except left ear pain. No fever, chills, sinus pressure, cough, ST, headache. No pain with palpation over ear. No ear drainage. He has tried nasal decongestant, ear wax remover with no improvement. No hearing loss.   .. Active Ambulatory Problems    Diagnosis Date Noted  . 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 GERD (gastroesophageal reflux disease) 05/13/2012  . Erectile dysfunction 05/24/2012  . Moderate obstructive sleep apnea 06/07/2012  . Depression 07/29/2013  . Medial epicondylitis of right elbow 03/10/2014  . Primary osteoarthritis of both knees 04/28/2014  . Essential tremor 09/19/2016  . Swelling of arm 06/16/2017  . Trochanteric bursitis, left hip 06/16/2017  . Obstructive uropathy 06/16/2017  . Seasonal allergies 06/10/2018  . Pseudogout of right prepatellar bursa 09/06/2018  . Status post ORIF of fracture of ankle 10/11/2018  . Septic prepatellar bursitis of right knee 10/13/2018  . Musculoskeletal neck pain 04/06/2019  . Radiculitis of left cervical region 06/22/2019   Resolved Ambulatory Problems    Diagnosis Date Noted  . Cough 01/06/2014  . Bilateral ankle pain 05/10/2014  . Elevated PSA 01/08/2015  . Increased laboratory test result 01/08/2015  .  No energy 08/03/2015   Past Medical History:  Diagnosis Date  . Prepatellar bursitis, right knee 09/06/2018   Reviewed med, allergy, problem list.     Observations/Objective: No acute distress Normal appearance and mood.  No cough No labored breathing.   .. Today's Vitals   12/06/19 1452  Temp: (!) 97 F (36.1 C)  TempSrc: Oral  Weight: 229 lb (103.9 kg)  Height: 6' (1.829 m)   Body mass index is 31.06 kg/m.    Assessment and Plan: John KitchenMarland KitchenNylan was seen today for ear pain.  Diagnoses and all orders for this visit:  Left ear pain -     methylPREDNISolone (MEDROL DOSEPAK) 4 MG TBPK tablet; Take as directed by package insert.   No sign of infection on exam via mychart today. Suspect ETD. Start with flonase 2 sprays each nostril daily. Add medrol dose pack if no benefit after 3 days. Follow up as needed.   Spent 10 minutes in patient care.  Follow Up Instructions:    I discussed the assessment and treatment plan with the patient. The patient was provided an opportunity to ask questions and all were answered. The patient agreed with the plan and demonstrated an understanding of the instructions.   The patient was advised to call back or seek an in-person evaluation if the symptoms worsen or if the condition fails to improve as anticipated.    John Planas, PA-C

## 2019-12-12 ENCOUNTER — Other Ambulatory Visit: Payer: Self-pay | Admitting: Physician Assistant

## 2019-12-12 MED ORDER — AMOXICILLIN-POT CLAVULANATE 875-125 MG PO TABS: 1.0000 | ORAL_TABLET | Freq: Two times a day (BID) | ORAL | 0 refills | Status: DC

## 2019-12-12 NOTE — Progress Notes (Signed)
Ear pain not improving. Using medrol dose pack and flonase. Sent augmentin.

## 2019-12-25 NOTE — Addendum Note (Signed)
Addended by: Donella Stade on: 12/25/2019 04:03 PM   Modules accepted: Level of Service

## 2019-12-28 ENCOUNTER — Encounter: Payer: Self-pay | Admitting: Medical-Surgical

## 2019-12-28 ENCOUNTER — Ambulatory Visit (INDEPENDENT_AMBULATORY_CARE_PROVIDER_SITE_OTHER): Admitting: Medical-Surgical

## 2019-12-28 ENCOUNTER — Other Ambulatory Visit: Payer: Self-pay

## 2019-12-28 VITALS — BP 110/73 | HR 84 | Temp 98.3°F

## 2019-12-28 DIAGNOSIS — M545 Low back pain, unspecified: Secondary | ICD-10-CM

## 2019-12-28 MED ORDER — PREDNISONE 50 MG PO TABS: 50.0000 mg | ORAL_TABLET | Freq: Every day | ORAL | 0 refills | Status: DC

## 2019-12-28 NOTE — Progress Notes (Signed)
Subjective:    CC: left lower back pain  HPI: Pleasant 59 year old male presenting today with reports of left lower back pain.  Has been experiencing constant dull aching pain for months but has worsened over the last 24-48 hours.  Reports pain is 3-5/10 on average.  Worse with sitting, position changes from sitting to standing, and lying on right side.  Better with lying on left side.  Denies numbness, tingling, pains shooting down legs, saddle paresthesias, and incontinence.  Has tried Tylenol as needed, mild relief.  Reports has occasionally tried heat but is unsure if this helped.  I reviewed the past medical history, family history, social history, surgical history, and allergies today and no changes were needed.  Please see the problem list section below in epic for further details.  Past Medical History: Past Medical History:  Diagnosis Date  . Erectile dysfunction   . GERD (gastroesophageal reflux disease)   . Prepatellar bursitis, right knee 09/06/2018   Past Surgical History: Past Surgical History:  Procedure Laterality Date  . APPENDECTOMY    . Left ankle repair  2012   Social History: Social History   Socioeconomic History  . Marital status: Married    Spouse name: Not on file  . Number of children: Not on file  . Years of education: Not on file  . Highest education level: Not on file  Occupational History  . Not on file  Tobacco Use  . Smoking status: Current Some Day Smoker  . Smokeless tobacco: Never Used  Substance and Sexual Activity  . Alcohol use: Not on file  . Drug use: Not on file  . Sexual activity: Not on file  Other Topics Concern  . Not on file  Social History Narrative  . Not on file   Social Determinants of Health   Financial Resource Strain:   . Difficulty of Paying Living Expenses: Not on file  Food Insecurity:   . Worried About Charity fundraiser in the Last Year: Not on file  . Ran Out of Food in the Last Year: Not on file   Transportation Needs:   . Lack of Transportation (Medical): Not on file  . Lack of Transportation (Non-Medical): Not on file  Physical Activity:   . Days of Exercise per Week: Not on file  . Minutes of Exercise per Session: Not on file  Stress:   . Feeling of Stress : Not on file  Social Connections:   . Frequency of Communication with Friends and Family: Not on file  . Frequency of Social Gatherings with Friends and Family: Not on file  . Attends Religious Services: Not on file  . Active Member of Clubs or Organizations: Not on file  . Attends Archivist Meetings: Not on file  . Marital Status: Not on file   Family History: History reviewed. No pertinent family history. Allergies: No Known Allergies Medications: See med rec.  Review of Systems: No fevers, chills, night sweats, weight loss, chest pain, or shortness of breath.   Objective:    General: Well Developed, well nourished, and in no acute distress.  Neuro: Alert and oriented x3. Sensation grossly intact. HEENT: Normocephalic, atraumatic.  Skin: Warm and dry. Cardiac: Regular rate and rhythm, no murmurs rubs or gallops, no lower extremity edema.  Respiratory: Clear to auscultation bilaterally. Not using accessory muscles, speaking in full sentences. MSK: No tenderness to palpation over lumbar spine or lumbar musculature. BLE strength 5/5 bilaterally. When asked to identify the spot  that hurts, patient points to area of left SI joint.   Impression and Recommendations:    Low back pain Suspect SI joint dysfunction.  Lumbar spine x-rays today.  Prednisone 50 mg burst dose x5 days.  Encourage conservative care including ice/heat, massage, stretches (handout given).  Continue as needed Tylenol.  If no improvement in 2 weeks, may need to follow-up with Dr. Darene Lamer for further evaluation.  Return if symptoms worsen or fail to improve.  ___________________________________________ Clearnce Sorrel, DNP, APRN,  FNP-BC Primary Care and [place]

## 2019-12-30 ENCOUNTER — Ambulatory Visit (INDEPENDENT_AMBULATORY_CARE_PROVIDER_SITE_OTHER)

## 2019-12-30 ENCOUNTER — Other Ambulatory Visit: Payer: Self-pay

## 2019-12-30 DIAGNOSIS — M545 Low back pain, unspecified: Secondary | ICD-10-CM

## 2020-01-19 ENCOUNTER — Encounter: Payer: Self-pay | Admitting: Physician Assistant

## 2020-02-28 ENCOUNTER — Other Ambulatory Visit: Payer: Self-pay | Admitting: Physician Assistant

## 2020-02-28 DIAGNOSIS — N139 Obstructive and reflux uropathy, unspecified: Secondary | ICD-10-CM

## 2020-02-28 DIAGNOSIS — J302 Other seasonal allergic rhinitis: Secondary | ICD-10-CM

## 2020-03-26 NOTE — Progress Notes (Signed)
Subjective:    CC: Cyst on left wrist  HPI: Pleasant 59 year old male presenting today with complaints of a possible cyst on his lateral left wrist. No erythema, lesion, fever, chills. Pain over the dorsal lateral aspect of left wrist extending up forearm with external rotation of forearm. Pain described as sharp, tense. Has not tried heat or ice. Taking Ibuprofen every 4-6 hours with minimal relief. No known injury to the area. Frequently on the computer and typing on the keyboard at home. No numbness or tingling in the fingers. No muscle weakness.   I reviewed the past medical history, family history, social history, surgical history, and allergies today and no changes were needed.  Please see the problem list section below in epic for further details.  Past Medical History: Past Medical History:  Diagnosis Date  . Erectile dysfunction   . GERD (gastroesophageal reflux disease)   . Prepatellar bursitis, right knee 09/06/2018   Past Surgical History: Past Surgical History:  Procedure Laterality Date  . APPENDECTOMY    . Left ankle repair  2012   Social History: Social History   Socioeconomic History  . Marital status: Married    Spouse name: Not on file  . Number of children: Not on file  . Years of education: Not on file  . Highest education level: Not on file  Occupational History  . Not on file  Tobacco Use  . Smoking status: Current Some Day Smoker  . Smokeless tobacco: Never Used  Substance and Sexual Activity  . Alcohol use: Not on file  . Drug use: Not on file  . Sexual activity: Not on file  Other Topics Concern  . Not on file  Social History Narrative  . Not on file   Social Determinants of Health   Financial Resource Strain:   . Difficulty of Paying Living Expenses:   Food Insecurity:   . Worried About Charity fundraiser in the Last Year:   . Arboriculturist in the Last Year:   Transportation Needs:   . Film/video editor (Medical):   [name] Lack of  Transportation (Non-Medical):   Physical Activity:   . Days of Exercise per Week:   . Minutes of Exercise per Session:   Stress:   . Feeling of Stress :   Social Connections:   . Frequency of Communication with Friends and Family:   . Frequency of Social Gatherings with Friends and Family:   . Attends Religious Services:   . Active Member of Clubs or Organizations:   . Attends Archivist Meetings:   [name] Marital Status:    Family History: History reviewed. No pertinent family history. Allergies: No Known Allergies Medications: See med rec.  Review of Systems: See HPI for pertinent positives and negatives.  Objective:    General: Well Developed, well nourished, and in no acute distress.  Neuro: Alert and oriented x3.  HEENT: Normocephalic, atraumatic.  Skin: Warm and dry. Cardiac: Regular rate and rhythm, no murmurs rubs or gallops, no lower extremity edema.  Respiratory: Clear to auscultation bilaterally. Not using accessory muscles, speaking in full sentences. Left upper extremity: Left ulnar styloid process slightly larger than right, without fluctuance, erythema, or notable edema. Tenderness to palpation along the medial and distal edge of the styloid process and ulnar side of the ventral wrist.    Impression and Recommendations:    1. Left wrist tendonitis Presentation suspicious for tendonitis. Discussed conservative treatment vs. Injection by Dr. Darene Lamer. Patient will  do conservative measures including heat, ice, rest. Also starting Meloxicam to see if this works better than the Ibuprofen. Advised to avoid other NSAIDs while taking the Meloxicam. Recommend bracing for comfort and reduction of irritation to the area. Patient declined stating "I won't wear it".  Return if symptoms worsen or fail to improve. May benefit from evaluation by Dr. Darene Lamer. ___________________________________________ Clearnce Sorrel, DNP, APRN, FNP-BC Primary Care and [place]

## 2020-03-27 ENCOUNTER — Ambulatory Visit (INDEPENDENT_AMBULATORY_CARE_PROVIDER_SITE_OTHER): Admitting: Medical-Surgical

## 2020-03-27 ENCOUNTER — Encounter: Payer: Self-pay | Admitting: Medical-Surgical

## 2020-03-27 VITALS — BP 105/74 | HR 91 | Temp 98.0°F | Ht 72.0 in | Wt 248.0 lb

## 2020-03-27 DIAGNOSIS — M778 Other enthesopathies, not elsewhere classified: Secondary | ICD-10-CM

## 2020-03-27 MED ORDER — MELOXICAM 15 MG PO TABS: 15.0000 mg | ORAL_TABLET | Freq: Every day | ORAL | 0 refills | Status: DC

## 2020-03-30 ENCOUNTER — Ambulatory Visit (INDEPENDENT_AMBULATORY_CARE_PROVIDER_SITE_OTHER): Admitting: Sports Medicine

## 2020-03-30 ENCOUNTER — Other Ambulatory Visit: Payer: Self-pay

## 2020-03-30 DIAGNOSIS — M47816 Spondylosis without myelopathy or radiculopathy, lumbar region: Secondary | ICD-10-CM

## 2020-03-30 MED ORDER — TRAMADOL HCL 50 MG PO TABS: 50.0000 mg | ORAL_TABLET | Freq: Three times a day (TID) | ORAL | 0 refills | Status: DC | PRN

## 2020-03-30 NOTE — Assessment & Plan Note (Addendum)
John Sharp is a pleasant 59 year old male, he has a known history of lumbar degenerative disc disease, L5-S1 on x-rays. He does have left-sided low back pain with radiation around the anterolateral hip. Worse with sitting, flexion, Valsalva. On exam there was some tenderness over the left sacroiliac joint so this was injected for diagnostic and therapeutic purposes. He had partial relief but still did have some discomfort suggesting there was an additional pain generator, likely the L5-S1 disc. Adding some home rehab exercises, return to see me in 1 month, we will likely proceed with an MRI for epidural planning if no better. Adding tramadol.

## 2020-03-30 NOTE — Progress Notes (Addendum)
    Procedures performed today:    Procedure: Real-time Ultrasound Guided injection of the left sacroiliac joint Device: Samsung HS60  Verbal informed consent obtained.  Time-out conducted.  Noted no overlying erythema, induration, or other signs of local infection.  Skin prepped in a sterile fashion.  Local anesthesia: Topical Ethyl chloride.  With sterile technique and under real time ultrasound guidance: 1 cc Kenalog 40, 2 cc lidocaine, 2 cc bupivacaine injected easily Completed without difficulty  Pain immediately resolved suggesting accurate placement of the medication.  Advised to call if fevers/chills, erythema, induration, drainage, or persistent bleeding.  Images permanently stored and available for review in the ultrasound unit.  Impression: Technically successful ultrasound guided injection.  Independent interpretation of notes and tests performed by another provider:   None.  Brief History, Exam, Impression, and Recommendations:    Lumbar spondylosis John Sharp is a pleasant 59 year old male, he has a known history of lumbar degenerative disc disease, L5-S1 on x-rays. He does have left-sided low back pain with radiation around the anterolateral hip. Worse with sitting, flexion, Valsalva. On exam there was some tenderness over the left sacroiliac joint so this was injected for diagnostic and therapeutic purposes. He had partial relief but still did have some discomfort suggesting there was an additional pain generator, likely the L5-S1 disc. Adding some home rehab exercises, return to see me in 1 month, we will likely proceed with an MRI for epidural planning if no better. Adding tramadol.    ___________________________________________ Gwen Her. Dianah Field, M.D., ABFM., CAQSM. Primary Care and [place] Instructor of [place] of [facility] of Medicine

## 2020-03-30 NOTE — Addendum Note (Signed)
Addended by: Silverio Decamp on: 03/30/2020 10:48 AM   Modules accepted: Orders

## 2020-04-13 ENCOUNTER — Encounter: Payer: Self-pay | Admitting: Sports Medicine

## 2020-04-13 ENCOUNTER — Other Ambulatory Visit: Payer: Self-pay

## 2020-04-13 ENCOUNTER — Ambulatory Visit (INDEPENDENT_AMBULATORY_CARE_PROVIDER_SITE_OTHER): Admitting: Sports Medicine

## 2020-04-13 DIAGNOSIS — M47816 Spondylosis without myelopathy or radiculopathy, lumbar region: Secondary | ICD-10-CM

## 2020-04-13 DIAGNOSIS — M7062 Trochanteric bursitis, left hip: Secondary | ICD-10-CM

## 2020-04-13 NOTE — Assessment & Plan Note (Signed)
John Sharp returns, he is a pleasant 59 year old male, he has a long history of hip and back pain, at the last visit we did an SI joint injection that did not provide significant relief, today we performed a left greater trochanteric bursa injection which has provided good relief. Return in 1 month, we certainly need to consider the lumbar spine as a possible pain generator.

## 2020-04-13 NOTE — Assessment & Plan Note (Signed)
At the last visit John Sharp had a left sacroiliac joint injection, he had some partial relief, the L5-S1 disc is also a potential pain generator, we will probably proceed with MRI for epidural planning if no better after the trochanteric bursa injection today.

## 2020-04-13 NOTE — Progress Notes (Signed)
    Procedures performed today:    Procedure: Real-time Ultrasound Guided injection of the left greater trochanteric bursa Device: Samsung HS60  Verbal informed consent obtained.  Time-out conducted.  Noted no overlying erythema, induration, or other signs of local infection.  Skin prepped in a sterile fashion.  Local anesthesia: Topical Ethyl chloride.  With sterile technique and under real time ultrasound guidance: 1 cc Kenalog 40, 2 cc lidocaine, 2 cc bupivacaine injected easily Completed without difficulty  Pain immediately resolved suggesting accurate placement of the medication.  Advised to call if fevers/chills, erythema, induration, drainage, or persistent bleeding.  Images permanently stored and available for review in the ultrasound unit.  Impression: Technically successful ultrasound guided injection.  Independent interpretation of notes and tests performed by another provider:   None.  Brief History, Exam, Impression, and Recommendations:    Trochanteric bursitis, left hip John Sharp returns, he is a pleasant 59 year old male, he has a long history of hip and back pain, at the last visit we did an SI joint injection that did not provide significant relief, today we performed a left greater trochanteric bursa injection which has provided good relief. Return in 1 month, we certainly need to consider the lumbar spine as a possible pain generator.  Lumbar spondylosis At the last visit John Sharp had a left sacroiliac joint injection, he had some partial relief, the L5-S1 disc is also a potential pain generator, we will probably proceed with MRI for epidural planning if no better after the trochanteric bursa injection today.    ___________________________________________ Gwen Her. Dianah Field, M.D., ABFM., CAQSM. Primary Care and [place] Instructor of [place] of [facility]

## 2020-04-21 ENCOUNTER — Other Ambulatory Visit: Payer: Self-pay | Admitting: Physician Assistant

## 2020-04-21 ENCOUNTER — Other Ambulatory Visit: Payer: Self-pay | Admitting: Sports Medicine

## 2020-04-21 DIAGNOSIS — N139 Obstructive and reflux uropathy, unspecified: Secondary | ICD-10-CM

## 2020-04-21 DIAGNOSIS — M11261 Other chondrocalcinosis, right knee: Secondary | ICD-10-CM

## 2020-04-27 ENCOUNTER — Ambulatory Visit: Admitting: Sports Medicine

## 2020-05-06 ENCOUNTER — Other Ambulatory Visit: Payer: Self-pay | Admitting: Physician Assistant

## 2020-05-06 DIAGNOSIS — J302 Other seasonal allergic rhinitis: Secondary | ICD-10-CM

## 2020-05-11 ENCOUNTER — Ambulatory Visit: Admitting: Sports Medicine

## 2020-05-19 ENCOUNTER — Other Ambulatory Visit: Payer: Self-pay | Admitting: Physician Assistant

## 2020-05-19 ENCOUNTER — Other Ambulatory Visit: Payer: Self-pay | Admitting: Medical-Surgical

## 2020-05-19 DIAGNOSIS — J302 Other seasonal allergic rhinitis: Secondary | ICD-10-CM

## 2020-05-19 DIAGNOSIS — N139 Obstructive and reflux uropathy, unspecified: Secondary | ICD-10-CM

## 2020-05-21 NOTE — Telephone Encounter (Signed)
Routing to PCP

## 2020-06-08 ENCOUNTER — Other Ambulatory Visit: Payer: Self-pay

## 2020-06-08 ENCOUNTER — Ambulatory Visit (INDEPENDENT_AMBULATORY_CARE_PROVIDER_SITE_OTHER): Admitting: Sports Medicine

## 2020-06-08 DIAGNOSIS — M47816 Spondylosis without myelopathy or radiculopathy, lumbar region: Secondary | ICD-10-CM | POA: Diagnosis not present

## 2020-06-08 DIAGNOSIS — M4807 Spinal stenosis, lumbosacral region: Secondary | ICD-10-CM

## 2020-06-08 MED ORDER — TRIAZOLAM 0.25 MG PO TABS: ORAL_TABLET | ORAL | 0 refills | Status: DC

## 2020-06-08 NOTE — Assessment & Plan Note (Addendum)
John Sharp returns, he had a left sacroiliac joint injection with only partial relief, trochanteric bursa injection provided no relief at the last visit, he also has some lumbar spondylitic processes on x-ray, we are now going to proceed with imaging of his lumbar spine for interventional planning, MRI will be ordered, return to go over results. Triazolam for preprocedural anxiolysis.  He had some SKs on his arm, I told him to discuss this with his PCP.

## 2020-06-08 NOTE — Progress Notes (Signed)
    Procedures performed today:    None.  Independent interpretation of notes and tests performed by another provider:   None.  Brief History, Exam, Impression, and Recommendations:    Lumbar spondylosis Kyron returns, he had a left sacroiliac joint injection with only partial relief, trochanteric bursa injection provided no relief at the last visit, he also has some lumbar spondylitic processes on x-ray, we are now going to proceed with imaging of his lumbar spine for interventional planning, MRI will be ordered, return to go over results. Triazolam for preprocedural anxiolysis.  He had some SKs on his arm, I told him to discuss this with his PCP.    ___________________________________________ Gwen Her. Dianah Field, M.D., ABFM., CAQSM. Primary Care and [place] Instructor of [place] [facility]

## 2020-06-11 ENCOUNTER — Other Ambulatory Visit: Payer: Self-pay | Admitting: Physician Assistant

## 2020-06-11 DIAGNOSIS — Z131 Encounter for screening for diabetes mellitus: Secondary | ICD-10-CM

## 2020-06-11 DIAGNOSIS — Z13220 Encounter for screening for lipoid disorders: Secondary | ICD-10-CM

## 2020-06-11 DIAGNOSIS — N139 Obstructive and reflux uropathy, unspecified: Secondary | ICD-10-CM

## 2020-06-11 MED ORDER — TAMSULOSIN HCL 0.4 MG PO CAPS: ORAL_CAPSULE | ORAL | 3 refills | Status: DC

## 2020-06-11 NOTE — Progress Notes (Signed)
Sent flomax for 90 day supply. Ordered labs.

## 2020-06-14 ENCOUNTER — Ambulatory Visit (INDEPENDENT_AMBULATORY_CARE_PROVIDER_SITE_OTHER): Admitting: Family Medicine

## 2020-06-14 ENCOUNTER — Other Ambulatory Visit: Payer: Self-pay

## 2020-06-14 VITALS — BP 120/88 | HR 79

## 2020-06-14 DIAGNOSIS — Z23 Encounter for immunization: Secondary | ICD-10-CM | POA: Diagnosis not present

## 2020-06-14 NOTE — Progress Notes (Signed)
Agree with documentation as above.   Debbrah Sampedro, MD  

## 2020-06-14 NOTE — Progress Notes (Signed)
Patient is here for first shingrix injection. Shingles vaccination to left deltoid with no apparent complications. Patient advised to schedule next injection in 2 months.

## 2020-06-15 ENCOUNTER — Ambulatory Visit

## 2020-06-17 ENCOUNTER — Other Ambulatory Visit: Payer: Self-pay

## 2020-06-17 ENCOUNTER — Ambulatory Visit (INDEPENDENT_AMBULATORY_CARE_PROVIDER_SITE_OTHER)

## 2020-06-17 DIAGNOSIS — M47816 Spondylosis without myelopathy or radiculopathy, lumbar region: Secondary | ICD-10-CM

## 2020-06-17 DIAGNOSIS — M4807 Spinal stenosis, lumbosacral region: Secondary | ICD-10-CM

## 2020-06-22 LAB — CBC
HCT: 43.5 % (ref 38.5–50.0)
Hemoglobin: 15 g/dL (ref 13.2–17.1)
MCH: 31.8 pg (ref 27.0–33.0)
MCHC: 34.5 g/dL (ref 32.0–36.0)
MCV: 92.2 fL (ref 80.0–100.0)
MPV: 11 fL (ref 7.5–12.5)
Platelets: 222 10*3/uL (ref 140–400)
RBC: 4.72 10*6/uL (ref 4.20–5.80)
RDW: 12.3 % (ref 11.0–15.0)
WBC: 4.3 10*3/uL (ref 3.8–10.8)

## 2020-06-22 LAB — COMPLETE METABOLIC PANEL WITH GFR
AG Ratio: 1.9 (calc) (ref 1.0–2.5)
ALT: 36 U/L (ref 9–46)
AST: 23 U/L (ref 10–35)
Albumin: 4.2 g/dL (ref 3.6–5.1)
Alkaline phosphatase (APISO): 67 U/L (ref 35–144)
BUN: 13 mg/dL (ref 7–25)
CO2: 29 mmol/L (ref 20–32)
Calcium: 9.3 mg/dL (ref 8.6–10.3)
Chloride: 103 mmol/L (ref 98–110)
Creat: 1.11 mg/dL (ref 0.70–1.33)
GFR, Est African American: 84 mL/min/{1.73_m2} (ref >=60)
GFR, Est Non African American: 73 mL/min/{1.73_m2} (ref >=60)
Globulin: 2.2 g/dL (calc) (ref 1.9–3.7)
Glucose, Bld: 78 mg/dL (ref 65–139)
Potassium: 4 mmol/L (ref 3.5–5.3)
Sodium: 140 mmol/L (ref 135–146)
Total Bilirubin: 0.6 mg/dL (ref 0.2–1.2)
Total Protein: 6.4 g/dL (ref 6.1–8.1)

## 2020-06-22 LAB — LIPID PANEL W/REFLEX DIRECT LDL
Cholesterol: 192 mg/dL (ref <200)
HDL: 38 mg/dL — ABNORMAL LOW (ref >=40)
LDL Cholesterol (Calc): 117 mg/dL (calc) — ABNORMAL HIGH
Non-HDL Cholesterol (Calc): 154 mg/dL (calc) — ABNORMAL HIGH (ref <130)
Total CHOL/HDL Ratio: 5.1 (calc) — ABNORMAL HIGH (ref <5.0)
Triglycerides: 258 mg/dL — ABNORMAL HIGH (ref <150)

## 2020-06-22 LAB — PSA: PSA: 1.7 ng/mL (ref <=4.0)

## 2020-06-22 LAB — HEMOGLOBIN A1C
Hgb A1c MFr Bld: 5.5 % of total Hgb (ref <5.7)
Mean Plasma Glucose: 111 (calc)
eAG (mmol/L): 6.2 (calc)

## 2020-06-24 ENCOUNTER — Other Ambulatory Visit: Payer: Self-pay | Admitting: Physician Assistant

## 2020-06-24 DIAGNOSIS — J302 Other seasonal allergic rhinitis: Secondary | ICD-10-CM

## 2020-06-25 ENCOUNTER — Telehealth: Payer: Self-pay | Admitting: Neurology

## 2020-06-25 ENCOUNTER — Other Ambulatory Visit: Payer: Self-pay | Admitting: Physician Assistant

## 2020-06-25 ENCOUNTER — Encounter: Payer: Self-pay | Admitting: Physician Assistant

## 2020-06-25 DIAGNOSIS — M47816 Spondylosis without myelopathy or radiculopathy, lumbar region: Secondary | ICD-10-CM

## 2020-06-25 DIAGNOSIS — E781 Pure hyperglyceridemia: Secondary | ICD-10-CM | POA: Insufficient documentation

## 2020-06-25 DIAGNOSIS — E785 Hyperlipidemia, unspecified: Secondary | ICD-10-CM | POA: Insufficient documentation

## 2020-06-25 MED ORDER — ATORVASTATIN CALCIUM 20 MG PO TABS
20.0000 mg | ORAL_TABLET | Freq: Every day | ORAL | 0 refills | Status: DC
Start: 2020-06-25 — End: 2020-07-13

## 2020-06-25 NOTE — Progress Notes (Signed)
John Sharp,   PSA looks great.  Kidney, liver, glucose looks great.  A1C normal.  Cholesterol and TG are elevated. Your 10 year CV risk is 7.8. it is recommended that you start a statin to decrease your CV risk. Are you ok with starting?

## 2020-06-25 NOTE — Telephone Encounter (Signed)
Called patient about his lab results and he states he changed his mind about injections.   Results on last MR:   Leontine Locket, [facility]  06/21/2020 11:00 AM EDT     Pt states he wants to hold off on the injections at this time.  He will contact the office.   Annamaria Helling, [place]  06/19/2020 2:19 PM EDT     Spoke with wife who will have patient call back for results.    Silverio Decamp, MD  06/18/2020 8:44 AM EDT     Severe multilevel facet arthritis, worse on the left side in the bottom 3 levels, this is amenable to facet joint injections, I am happy to order these if he would desire.   Dr. Darene Lamer - He would like you to go ahead and order injections.

## 2020-06-25 NOTE — Progress Notes (Signed)
Sent lipitor daily to pharmacy.

## 2020-06-25 NOTE — Telephone Encounter (Signed)
Done

## 2020-06-25 NOTE — Addendum Note (Signed)
Addended by: Silverio Decamp on: 06/25/2020 06:26 PM   Modules accepted: Orders

## 2020-06-27 ENCOUNTER — Other Ambulatory Visit: Payer: Self-pay | Admitting: Physician Assistant

## 2020-07-12 ENCOUNTER — Other Ambulatory Visit: Payer: Self-pay | Admitting: Physician Assistant

## 2020-07-12 DIAGNOSIS — J302 Other seasonal allergic rhinitis: Secondary | ICD-10-CM

## 2020-07-13 ENCOUNTER — Other Ambulatory Visit: Payer: Self-pay | Admitting: Neurology

## 2020-07-13 MED ORDER — ATORVASTATIN CALCIUM 20 MG PO TABS: 20.0000 mg | ORAL_TABLET | Freq: Every day | ORAL | 0 refills | Status: DC

## 2020-08-14 ENCOUNTER — Ambulatory Visit

## 2020-08-14 ENCOUNTER — Encounter: Payer: Self-pay | Admitting: Physician Assistant

## 2020-08-14 ENCOUNTER — Ambulatory Visit (INDEPENDENT_AMBULATORY_CARE_PROVIDER_SITE_OTHER): Admitting: Physician Assistant

## 2020-08-14 ENCOUNTER — Telehealth: Payer: Self-pay | Admitting: Neurology

## 2020-08-14 ENCOUNTER — Other Ambulatory Visit: Payer: Self-pay

## 2020-08-14 VITALS — BP 125/87 | HR 87 | Ht 72.0 in

## 2020-08-14 DIAGNOSIS — H6121 Impacted cerumen, right ear: Secondary | ICD-10-CM | POA: Diagnosis not present

## 2020-08-14 DIAGNOSIS — T161XXA Foreign body in right ear, initial encounter: Secondary | ICD-10-CM | POA: Insufficient documentation

## 2020-08-14 DIAGNOSIS — G4733 Obstructive sleep apnea (adult) (pediatric): Secondary | ICD-10-CM

## 2020-08-14 DIAGNOSIS — J302 Other seasonal allergic rhinitis: Secondary | ICD-10-CM

## 2020-08-14 DIAGNOSIS — Z9989 Dependence on other enabling machines and devices: Secondary | ICD-10-CM

## 2020-08-14 DIAGNOSIS — Z23 Encounter for immunization: Secondary | ICD-10-CM

## 2020-08-14 MED ORDER — MONTELUKAST SODIUM 10 MG PO TABS: ORAL_TABLET | ORAL | 3 refills | Status: DC

## 2020-08-14 MED ORDER — AMBULATORY NON FORMULARY MEDICATION: 0 refills | Status: AC

## 2020-08-14 NOTE — Patient Instructions (Addendum)
Hardin Negus Recall Website:  [email or web address]  Will fax CPAP prescription.

## 2020-08-14 NOTE — Telephone Encounter (Signed)
CPAP order faxed to [facility] at [phone] with confirmation received.

## 2020-08-14 NOTE — Progress Notes (Signed)
Subjective:    Patient ID: John Sharp, male    DOB: Nov 15, 1960, 59 y.o.   MRN: [id number]  HPI  Pt is a 59 yo obese male with OSA, HTN, GERD, hypogonadism who presents to the clinic to discuss need for new order for CPAP.   Pt's machine was a part of the recall on CPaP machines. He uses [facility] and says he needs order. Last sleep study was in [place] about 8 years ago.   Pt feels like fluid in right ear and hard to hear. No pain. Not tried anything to make better.   .. Active Ambulatory Problems    Diagnosis Date Noted  . 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 GERD (gastroesophageal reflux disease) 05/13/2012  . Erectile dysfunction 05/24/2012  . OSA on CPAP 06/07/2012  . Depression 07/29/2013  . Medial epicondylitis of right elbow 03/10/2014  . Primary osteoarthritis of both knees 04/28/2014  . Essential tremor 09/19/2016  . Swelling of arm 06/16/2017  . Trochanteric bursitis, left hip 06/16/2017  . Obstructive uropathy 06/16/2017  . Seasonal allergies 06/10/2018  . Pseudogout of right prepatellar bursa 09/06/2018  . Status post ORIF of fracture of ankle 10/11/2018  . Septic prepatellar bursitis of right knee 10/13/2018  . Musculoskeletal neck pain 04/06/2019  . Radiculitis of left cervical region 06/22/2019  . Lumbar spondylosis 03/30/2020  . Hyperlipidemia 06/25/2020  . Hypertriglyceridemia 06/25/2020  . Acute foreign body of right ear canal 08/14/2020   Resolved Ambulatory Problems    Diagnosis Date Noted  . Cough 01/06/2014  . Bilateral ankle pain 05/10/2014  . Elevated PSA 01/08/2015  . Increased laboratory test result 01/08/2015  . No energy 08/03/2015   Past Medical History:  Diagnosis Date  . Prepatellar bursitis, right knee 09/06/2018         Review of Systems  All other systems reviewed and are negative.      Objective:   Physical Exam Vitals reviewed.  Constitutional:      Appearance: Normal appearance. He is obese.  HENT:      Head: Normocephalic.     Ears:     Comments: Right TM impacted with cotton ball.  Cardiovascular:     Rate and Rhythm: Normal rate and regular rhythm.     Pulses: Normal pulses.  Pulmonary:     Effort: Pulmonary effort is normal.     Breath sounds: Normal breath sounds.  Neurological:     General: No focal deficit present.     Mental Status: He is alert and oriented to person, place, and time.  Psychiatric:        Mood and Affect: Mood normal.           Assessment & Plan:  Marland KitchenMarland KitchenBreyer was seen today for sleep apnea.  Diagnoses and all orders for this visit:  OSA on CPAP -     AMBULATORY NON FORMULARY MEDICATION; Continuous positive airway pressure (CPAP) machine set at previous settings on file of H2O pressure, with all supplemental supplies as needed.  Flu vaccine need -     Flu Vaccine QUAD 36+ mos IM  Need for shingles vaccine -     Varicella-zoster vaccine IM  Seasonal allergies -     montelukast (SINGULAIR) 10 MG tablet; TAKE 1 TABLET AT BEDTIME.  Acute foreign body of right ear canal, initial encounter  Need for Tdap vaccination -     Tdap vaccine greater than or equal to 7yo IM   Pt here to get UTD  on vaccines.   New order for CPAP sent to [facility]. He was given information to file claim for recall.   Foreign body of ear removed. Ok for flonase for some fluid behind TM. After cotton ball removal hearing and ear pressure sensation should resolve. Refilled singular for allergies. Follow up as needed.   [name].Cerumen Removal Template: Indication: foreign body of right ear Medical necessity statement: On physical examination, ball of cotton impairs clinically significant portions of the external auditory canal, and tympanic membrane. Noted obstructive, copious cerumen that cannot be removed without magnification and instrumentations requiring physician skills Consent: Discussed benefits and risks of procedure and verbal consent obtained Procedure: Patient was  prepped for the procedure. Utilized an otoscope to assess and take note of the ear canal, the tympanic membrane, and the presence, amount, and placement of the cerumen. Gentle water irrigation and soft plastic curette was utilized to remove cerumen.  Post procedure examination shows cerumen was completely removed. Patient tolerated procedure well. The patient is made aware that they may experience temporary vertigo, temporary hearing loss, and temporary discomfort. If these symptom last for more than 24 hours to call the clinic or proceed to the ED.  Follow up in 6 months.

## 2020-11-04 ENCOUNTER — Other Ambulatory Visit: Payer: Self-pay | Admitting: Physician Assistant

## 2020-12-04 ENCOUNTER — Other Ambulatory Visit: Payer: Self-pay | Admitting: Physician Assistant

## 2020-12-04 DIAGNOSIS — K2100 Gastro-esophageal reflux disease with esophagitis, without bleeding: Secondary | ICD-10-CM

## 2020-12-04 MED ORDER — PANTOPRAZOLE SODIUM 40 MG PO TBEC: 40.0000 mg | DELAYED_RELEASE_TABLET | Freq: Every day | ORAL | 0 refills | Status: DC

## 2021-01-07 ENCOUNTER — Telehealth: Payer: Self-pay

## 2021-01-07 ENCOUNTER — Ambulatory Visit (INDEPENDENT_AMBULATORY_CARE_PROVIDER_SITE_OTHER): Admitting: Physician Assistant

## 2021-01-07 ENCOUNTER — Other Ambulatory Visit: Payer: Self-pay

## 2021-01-07 ENCOUNTER — Encounter: Payer: Self-pay | Admitting: Physician Assistant

## 2021-01-07 VITALS — BP 118/84 | HR 87 | Ht 72.0 in

## 2021-01-07 DIAGNOSIS — R519 Headache, unspecified: Secondary | ICD-10-CM

## 2021-01-07 DIAGNOSIS — R109 Unspecified abdominal pain: Secondary | ICD-10-CM

## 2021-01-07 NOTE — Progress Notes (Signed)
Subjective:    Patient ID: John Sharp, male    DOB: Oct 03, 1961, 60 y.o.   MRN: [id number]  HPI  Patient is a 60 year old obese male with GERD, OSA, hypogonadism, chronic back pain who presents to the clinic to follow-up after ED visit on 01/05/2019.  That morning he ate breakfast at a bagel shop and started noticing some abdominal pain.  His wife gave him some MiraLAX in juice formulation.  His abdominal pain worsened significantly where he went to the emergency room.  Patient was very nauseated but never vomited.  UA was done and some trace protein, white blood cell were normal, hemoglobin was normal, kidney liver and glucose great.  He was negative for Covid/flu/RSV.  His CT scan showed no intestinal obstruction however could not rule out colonic ileus due to some gaseous distention.  He was given IV fluids, pain medicine, Zofran, Pepcid.  He was discharged with Pepcid and Zofran.  CT  1. No evidence of bowel obstruction. Nonspecific mild gaseous distention of the colon which may represent a colonic ileus.  2. The appendix is not visualized. No pericecal inflammation to suggest acute appendicitis.  3. Mild colonic diverticulosis without evidence of diverticulitis.   The only new medication patient has been on his prednisone.  He was put on this by Dr. Francesco Runner, pain clinic for neck pain and increase in headaches.  Patient does reports he has had left-sided headaches for the last 3 weeks.  Headaches are dull and constant with occasional more painful sharp headache.  He denies any URI symptoms such as sinus pressure, sore throat, ear pain, cough. No history of allergies.  He denies any nausea, vomiting, light sensitivity, dizziness or vision changes associated with headaches. Taking tylenol and mobic as well as muscle relaxer as needed.   .. Active Ambulatory Problems    Diagnosis Date Noted  . 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 GERD (gastroesophageal reflux disease) 05/13/2012  . Erectile  dysfunction 05/24/2012  . OSA on CPAP 06/07/2012  . Depression 07/29/2013  . Medial epicondylitis of right elbow 03/10/2014  . Primary osteoarthritis of both knees 04/28/2014  . Essential tremor 09/19/2016  . Swelling of arm 06/16/2017  . Trochanteric bursitis, left hip 06/16/2017  . Obstructive uropathy 06/16/2017  . Seasonal allergies 06/10/2018  . Pseudogout of right prepatellar bursa 09/06/2018  . Status post ORIF of fracture of ankle 10/11/2018  . Septic prepatellar bursitis of right knee 10/13/2018  . Musculoskeletal neck pain 04/06/2019  . Radiculitis of left cervical region 06/22/2019  . Lumbar spondylosis 03/30/2020  . Hyperlipidemia 06/25/2020  . Hypertriglyceridemia 06/25/2020  . Acute foreign body of right ear canal 08/14/2020  . Frequent headaches 01/08/2021  . Acute abdominal pain 01/08/2021   Resolved Ambulatory Problems    Diagnosis Date Noted  . Cough 01/06/2014  . Bilateral ankle pain 05/10/2014  . Elevated PSA 01/08/2015  . Increased laboratory test result 01/08/2015  . No energy 08/03/2015   Past Medical History:  Diagnosis Date  . Prepatellar bursitis, right knee 09/06/2018    Review of Systems See HPI.     Objective:   Physical Exam Vitals reviewed.  Constitutional:      Appearance: Normal appearance. He is obese. He is not ill-appearing.  HENT:     Head: Normocephalic.     Right Ear: Tympanic membrane and ear canal normal. There is no impacted cerumen.     Left Ear: Tympanic membrane and ear canal normal. There is no impacted cerumen.  Nose: Nose normal.     Mouth/Throat:     Mouth: Mucous membranes are moist.  Eyes:     Extraocular Movements: Extraocular movements intact.     Conjunctiva/sclera: Conjunctivae normal.     Pupils: Pupils are equal, round, and reactive to light.  Neck:     Vascular: No carotid bruit.     Comments: Tight paraspinal muscles around neck to palpation.  Cardiovascular:     Rate and Rhythm: Normal rate and  regular rhythm.     Pulses: Normal pulses.     Heart sounds: Normal heart sounds. No murmur heard.   Pulmonary:     Effort: Pulmonary effort is normal.     Breath sounds: Normal breath sounds. No wheezing or rhonchi.  Abdominal:     General: Bowel sounds are normal. There is no distension.     Palpations: Abdomen is soft.     Tenderness: There is no guarding or rebound.     Comments: Generalized discomfort but no pain.   Musculoskeletal:     Cervical back: Normal range of motion.  Neurological:     General: No focal deficit present.     Mental Status: He is alert and oriented to person, place, and time.     Cranial Nerves: No cranial nerve deficit.     Motor: No weakness.     Coordination: Coordination normal.     Gait: Gait normal.     Deep Tendon Reflexes: Reflexes normal.  Psychiatric:        Mood and Affect: Mood normal.           Assessment & Plan:  Marland KitchenMarland KitchenRasul was seen today for hospitalization follow-up.  Diagnoses and all orders for this visit:  Acute abdominal pain  Frequent headaches   Colonic ileus vs psuedo-obstruction. CT scan did some gaseous distention. Reassuring that pain is improving and bowels are moving today. Continue zofran and pepcid for now. He was on prednisone for headaches perhaps could have added in this or perhaps a virus. Avoid any pain medications for now. Do not drink any alcohol. Slowly advance foods starting bland and working up. Eat small portions at a time. Drink plenty of fluids. If pain returns please alert office or go to ED. Follow up with GI.   No red flags of headache on exam. No neuro symptoms. Start with eye exam and massage for neck. Consider some muscle relaxers at night.

## 2021-01-07 NOTE — Telephone Encounter (Signed)
Transition Care Management Unsuccessful Follow-up Telephone Call  Date of discharge and from where:  01/05/2021 from [facility]  Attempts:  1st Attempt  Reason for unsuccessful TCM follow-up call:  Left voice message

## 2021-01-07 NOTE — Patient Instructions (Addendum)
Intestinal Pseudo-Obstruction  Intestinal pseudo-obstruction is a condition that causes symptoms of a blockage in the intestines without an actual obstruction. The intestinal tract is made up of the hollow organs that digest food after it leaves the stomach. Most digestion takes place in the upper part of the intestines (small intestine). Undigested food leaves the small intestine and passes into the lower part (large intestine). There, water is absorbed and stools are formed. Intestinal pseudo-obstruction can take place anywhere along this tract. The condition can be a short-term problem (acute) or a long-term (chronic) disease. What are the causes? Causes of this condition are categorized as primary or secondary.  Primary causes are abnormalities in the nerves and muscles that move food through the intestines. These abnormalities can be caused by gene defects (genetic mutations) that are passed down through families (inherited).  Secondary causes are problems that result from other diseases or treatments that may affect the intestines. These include: ? Muscle and nervous system diseases. ? Infections. ? Cancer or cancer treatments. ? Medicines. ? Surgery. In some cases, the cause of this condition cannot be found (idiopathic). What are the signs or symptoms? Symptoms can vary from person to person. The symptoms depend on the cause of the condition and whether it is acute or chronic. Symptoms may include:  Pain, swelling, or bloating in the abdomen.  Nausea and vomiting.  Difficulty passing stool (constipation) or passing watery stool (diarrhea).  Poor nutrition and weight loss. How is this diagnosed? This condition may be diagnosed based on:  Symptoms and medical history.  Physical exam.  Tests to confirm the diagnosis. These may include: ? Blood tests. ? Exams to look into the small intestine or the large intestine (endoscopy or colonoscopy). ? Taking an X-ray of the digestive  tract after swallowing a substance that makes images clear (barium study). ? CT scan of the digestive tract. ? Placing a tube into the intestine to measure pressure (manometry). ? Removing a small piece of tissue from the intestinal wall to be examined under a microscope (biopsy). Intestinal pseudo-obstruction can be hard to diagnose because it can have many causes. Symptoms can also be similar to the symptoms of many other diseases. Most people need to have several exams. You may also need to see a health care provider who specializes in the digestive tract (gastroenterologist). How is this treated? Treatment depends on the causes of your condition. It may include:  Treating an underlying disease.  Changing medicines that may be causing the symptoms.  Using antibiotic medicines to treat an infection. Other treatments may include:  Placing a tube (colonoscope) into the large intestine to remove gas.  Placing a tube (nasogastric tube) through the nose and down into the small intestine to remove gas or fluids.  Placing a tube (feeding tube) into the stomach or small intestine for liquid nutrition.  Medicines for: ? Pain. ? Nausea. ? Diarrhea. ? Constipation. ? Stimulating the movement of the muscles of the intestines.  Surgery to remove part of the intestine. This is rare. It is needed if other treatments have not worked. Follow these instructions at home: Medicines  Take over-the-counter and prescription medicines only as told by your health care provider.  If you were prescribed an antibiotic medicine, take it as told by your health care provider. Do not stop taking the antibiotic even if you start to feel better. Eating and drinking  Make any changes to your diet or your eating habits as recommended by your health care  provider. This might include: ? Eating smaller meals more often. Try having 5 or 6 smaller meals each day instead of 3 large meals. ? Eating low fiber  foods. ? Eating pureed foods or liquid food supplements. This may ease symptoms. ? Taking vitamin and mineral supplements to help prevent poor nutrition. Ask your health care provider to recommend a multivitamin. General instructions  Keep all follow-up visits as told by your health care provider. This is important. Contact a health care provider if:  Your symptoms do not go away or get worse. Get help right away if you:  Have severe pain in the abdomen.  Cannot eat or drink without vomiting. Summary  Intestinal pseudo-obstruction is a condition that causes symptoms of a blockage in your intestinal tract without an actual obstruction.  Primary causes are abnormalities in the nerves and muscles that move food through the intestines. Secondary causes are problems that result from other diseases or treatments that affect the intestines.  Symptoms depend on the cause of the condition and whether it is short-term or long-term. They may include pain in the abdomen, nausea and vomiting, constipation or diarrhea, or poor nutrition and weight loss.  Treatment depends on the cause of your condition, but it may include changes to your diet or eating habits, medicines, or surgery. This information is not intended to replace advice given to you by your health care provider. Make sure you discuss any questions you have with your health care provider. Document Revised: 04/26/2020 Document Reviewed: 04/26/2020 Elsevier Patient Education  2021 [place]. i Ileus  Ileus is a condition that happens when the intestines, which are also called bowels, stop working correctly. The intestines are hollow organs that digest food after the food leaves the stomach. These organs are long, muscular tubes that connect the stomach to the rectum. When ileus occurs, the muscular contractions that cause food to move through the intestines do not happen as they normally would. If the intestines stop working, food  cannot pass through to get digested. This condition is a serious problem that usually requires hospitalization. It can cause symptoms such as nausea, abdominal pain, and bloating. Ileus can last from a few hours to a few days. What are the causes? This condition may be caused by:  Surgery on the abdomen.  An infection or inflammation in the abdomen. This includes inflammation of the lining of the abdomen (peritonitis).  Infection or inflammation in other parts of the body, such as pneumonia or pancreatitis.  Passage of gallstones or kidney stones.  Damage to the nerves or blood vessels that go to the intestines.  A collection of blood within the abdominal cavity.  Imbalance in the salts in the blood (electrolytes).  Injury to the brain or spinal cord.  Medicines. Many medicines, including strong pain medicines, can cause ileus or make it worse. If the intestines stop working because of a blockage, that is a different condition that is called a bowel obstruction. What are the signs or symptoms? Symptoms of this condition include:  Bloating of the abdomen.  Pain or discomfort in the abdomen.  Poor appetite.  Nausea and vomiting.  Lack of normal bowel sounds, such as "growling" in the stomach. How is this diagnosed? This condition may be diagnosed with:  A physical exam and medical history.  X-rays or a CT scan of the abdomen. You may also have other tests to help find the cause of the condition. How is this treated? This condition may be treated  by:  Resting the intestines until they start to work again. This is often done by: ? Stopping oral intake of food and drink. You will be given fluid through an IV to prevent dehydration. ? Placing a small tube (nasogastric tube or NG tube) that is passed through your nose and into your stomach. The tube is attached to a suction device and keeps the stomach emptied out. This allows the bowels to rest and helps to reduce nausea and  vomiting.  Correcting any electrolyte imbalance by giving supplements in the IV fluid.  Stopping any medicines that might make ileus worse.  Treating any condition that may have caused ileus. Follow these instructions at home: Eating and drinking  Follow instructions from your health care provider about: ? What to eat and drink. You may be told to start eating a bland diet. Over time, you may slowly resume a more normal, healthy diet. ? How much to eat and drink. You should eat small meals often and stop eating when you feel full.  Avoid alcohol.   General instructions  Take over-the-counter and prescription medicines only as told by your health care provider.  Rest as told by your health care provider.  Avoid sitting for a long time without moving. Get up to take short walks every 1-2 hours. Ask for help if you feel weak or unsteady.  Keep all follow-up visits as told by your health care provider. This is important. Contact a health care provider if:  You have nausea, vomiting, or abdominal discomfort.  You have a fever. Get help right away if:  You have severe abdominal pain or bloating.  You cannot eat or drink without vomiting. Summary  Ileus is a condition that happens when the intestines, which are also called bowels, stop working correctly.  When ileus occurs, the muscular contractions that cause food to move through the intestines do not happen as they normally would.  Ileus can cause symptoms such as nausea, abdominal pain, and bloating.  Treatment may involve getting IV fluids and having a nasogastric tube placed to keep your stomach emptied out until the intestines start working again. This information is not intended to replace advice given to you by your health care provider. Make sure you discuss any questions you have with your health care provider. Document Revised: 02/22/2018 Document Reviewed: 02/22/2018 Elsevier Patient Education  [place].

## 2021-01-08 ENCOUNTER — Encounter: Payer: Self-pay | Admitting: Physician Assistant

## 2021-01-08 DIAGNOSIS — R109 Unspecified abdominal pain: Secondary | ICD-10-CM | POA: Insufficient documentation

## 2021-01-08 DIAGNOSIS — R519 Headache, unspecified: Secondary | ICD-10-CM | POA: Insufficient documentation

## 2021-01-08 NOTE — Telephone Encounter (Signed)
Pt has completed visit with PCP.

## 2021-01-21 ENCOUNTER — Encounter: Payer: Self-pay | Admitting: Physician Assistant

## 2021-01-21 ENCOUNTER — Ambulatory Visit (INDEPENDENT_AMBULATORY_CARE_PROVIDER_SITE_OTHER): Admitting: Physician Assistant

## 2021-01-21 DIAGNOSIS — R143 Flatulence: Secondary | ICD-10-CM | POA: Diagnosis not present

## 2021-01-21 DIAGNOSIS — R195 Other fecal abnormalities: Secondary | ICD-10-CM

## 2021-01-21 DIAGNOSIS — J029 Acute pharyngitis, unspecified: Secondary | ICD-10-CM | POA: Diagnosis not present

## 2021-01-21 LAB — POCT RAPID STREP A (OFFICE): Rapid Strep A Screen: NEGATIVE

## 2021-01-21 NOTE — Progress Notes (Signed)
Patient ID: John Sharp, male   DOB: 08-01-1961, 60 y.o.   MRN: [id number] .Marland KitchenVirtual Visit via Telephone Note  I connected with John Sharp on 01/21/21 at 11:10 AM EDT by telephone and verified that I am speaking with the correct person using two identifiers.  Location: Patient: home Provider: clinic  .Marland KitchenParticipating in visit:  Patient: John Sharp Provider: Iran Planas PA-C   I discussed the limitations, risks, security and privacy concerns of performing an evaluation and management service by telephone and the availability of in person appointments. I also discussed with the patient that there may be a patient responsible charge related to this service. The patient expressed understanding and agreed to proceed.   History of Present Illness: Pt is a 60 yo male who c/o sore throat since this morning. Has some loose stools and gassy as well. No fever, chills, SOB, cough, headache. Not tried anything to make better. Covid vaccinated. Rapid home test negative.   .. Active Ambulatory Problems    Diagnosis Date Noted  . 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 GERD (gastroesophageal reflux disease) 05/13/2012  . Erectile dysfunction 05/24/2012  . OSA on CPAP 06/07/2012  . Depression 07/29/2013  . Medial epicondylitis of right elbow 03/10/2014  . Primary osteoarthritis of both knees 04/28/2014  . Essential tremor 09/19/2016  . Swelling of arm 06/16/2017  . Trochanteric bursitis, left hip 06/16/2017  . Obstructive uropathy 06/16/2017  . Seasonal allergies 06/10/2018  . Pseudogout of right prepatellar bursa 09/06/2018  . Status post ORIF of fracture of ankle 10/11/2018  . Septic prepatellar bursitis of right knee 10/13/2018  . Musculoskeletal neck pain 04/06/2019  . Radiculitis of left cervical region 06/22/2019  . Lumbar spondylosis 03/30/2020  . Hyperlipidemia 06/25/2020  . Hypertriglyceridemia 06/25/2020  . Acute foreign body of right ear canal 08/14/2020  . Frequent headaches  01/08/2021  . Acute abdominal pain 01/08/2021   Resolved Ambulatory Problems    Diagnosis Date Noted  . Cough 01/06/2014  . Bilateral ankle pain 05/10/2014  . Elevated PSA 01/08/2015  . Increased laboratory test result 01/08/2015  . No energy 08/03/2015   Past Medical History:  Diagnosis Date  . Prepatellar bursitis, right knee 09/06/2018   Reviewed med, allergy, problem list.     Observations/Objective: No acute distress Normal breathing  .Marland KitchenThere were no vitals filed for this visit. There is no height or weight on file to calculate BMI.  .. Results for orders placed or performed in visit on 01/21/21  POCT rapid strep A  Result Value Ref Range   Rapid Strep A Screen Negative Negative     Assessment and Plan: Marland KitchenMarland KitchenDreyton was seen today for sore throat.  Diagnoses and all orders for this visit:  Sore throat -     POCT rapid strep A  Loose stools  Flatulence   Negative for strep. covid rapid negative. Symptomatic care discussed with salt water gargles, ibuprofen, tylenol and throat spray. If symptoms change or worsen let me know.    Follow Up Instructions:    I discussed the assessment and treatment plan with the patient. The patient was provided an opportunity to ask questions and all were answered. The patient agreed with the plan and demonstrated an understanding of the instructions.   The patient was advised to call back or seek an in-person evaluation if the symptoms worsen or if the condition fails to improve as anticipated.  I provided 10 minutes of non-face-to-face time during this encounter.   Iran Planas, PA-C

## 2021-01-25 ENCOUNTER — Encounter: Payer: Self-pay | Admitting: Physician Assistant

## 2021-01-25 ENCOUNTER — Telehealth (INDEPENDENT_AMBULATORY_CARE_PROVIDER_SITE_OTHER): Admitting: Physician Assistant

## 2021-01-25 VITALS — Ht 72.0 in | Wt 248.0 lb

## 2021-01-25 DIAGNOSIS — J0100 Acute maxillary sinusitis, unspecified: Secondary | ICD-10-CM | POA: Diagnosis not present

## 2021-01-25 MED ORDER — AMOXICILLIN-POT CLAVULANATE 875-125 MG PO TABS: 1.0000 | ORAL_TABLET | Freq: Two times a day (BID) | ORAL | 0 refills | Status: DC

## 2021-01-25 NOTE — Progress Notes (Signed)
Patient ID: John Sharp, male   DOB: May 04, 1961, 60 y.o.   MRN: [id number] .Marland KitchenVirtual Visit via Telephone Note  I connected with John Sharp on 01/25/21 at  8:50 AM EDT by telephone and verified that I am speaking with the correct person using two identifiers.  Location: Patient: home Provider: clinic  .Marland KitchenParticipating in visit:  Patient: John Sharp Provider: Iran Planas PA-C   I discussed the limitations, risks, security and privacy concerns of performing an evaluation and management service by telephone and the availability of in person appointments. I also discussed with the patient that there may be a patient responsible charge related to this service. The patient expressed understanding and agreed to proceed.   History of Present Illness: Patient is a 60 year old male who calls into the clinic with persistent sinus symptoms, cough, throat drainage, ear popping, pressure for the last week.  He was tested for strep on Monday which was negative.  He has tested negative for Covid.  He denies any fever, chills, body aches, loss of smell or taste, GI symptoms.  He is taking Tylenol Cold sinus severe, cough drops and allergy medications.  He did get a little better and then he started getting worse again.  He denies any shortness of breath or wheezing.  .. Active Ambulatory Problems    Diagnosis Date Noted  . 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 GERD (gastroesophageal reflux disease) 05/13/2012  . Erectile dysfunction 05/24/2012  . OSA on CPAP 06/07/2012  . Depression 07/29/2013  . Medial epicondylitis of right elbow 03/10/2014  . Primary osteoarthritis of both knees 04/28/2014  . Essential tremor 09/19/2016  . Swelling of arm 06/16/2017  . Trochanteric bursitis, left hip 06/16/2017  . Obstructive uropathy 06/16/2017  . Seasonal allergies 06/10/2018  . Pseudogout of right prepatellar bursa 09/06/2018  . Status post ORIF of fracture of ankle 10/11/2018  . Septic prepatellar  bursitis of right knee 10/13/2018  . Musculoskeletal neck pain 04/06/2019  . Radiculitis of left cervical region 06/22/2019  . Lumbar spondylosis 03/30/2020  . Hyperlipidemia 06/25/2020  . Hypertriglyceridemia 06/25/2020  . Acute foreign body of right ear canal 08/14/2020  . Frequent headaches 01/08/2021  . Acute abdominal pain 01/08/2021   Resolved Ambulatory Problems    Diagnosis Date Noted  . Cough 01/06/2014  . Bilateral ankle pain 05/10/2014  . Elevated PSA 01/08/2015  . Increased laboratory test result 01/08/2015  . No energy 08/03/2015   Past Medical History:  Diagnosis Date  . Prepatellar bursitis, right knee 09/06/2018   Reviewed med, allergy, problem list.      Observations/Objective: No acute distress  Normal mood No cough or labored breathing  .[name] Today's Vitals   01/25/21 0830  Weight: 248 lb (112.5 kg)  Height: 6' (1.829 m)   Body mass index is 33.63 kg/m.    Assessment and Plan: Marland KitchenMarland KitchenHalton was seen today for sinus problem.  Diagnoses and all orders for this visit:  Acute non-recurrent maxillary sinusitis -     amoxicillin-clavulanate (AUGMENTIN) 875-125 MG tablet; Take 1 tablet by mouth 2 (two) times daily.   Off and on URI issues for last week. Got better then got worse. On tylenol cold sinus severe and cough drops. Not improving.  covid testing negative.  Sent augmentin. Get flonase OTC.  Follow up as needed or if worsening.    Follow Up Instructions:    I discussed the assessment and treatment plan with the patient. The patient was provided an opportunity to ask questions and  all were answered. The patient agreed with the plan and demonstrated an understanding of the instructions.   The patient was advised to call back or seek an in-person evaluation if the symptoms worsen or if the condition fails to improve as anticipated.  I provided 10 minutes of non-face-to-face time during this encounter.   John Planas, PA-C

## 2021-02-26 ENCOUNTER — Other Ambulatory Visit: Payer: Self-pay | Admitting: Physician Assistant

## 2021-02-26 MED ORDER — METHYLPREDNISOLONE 4 MG PO TBPK: ORAL_TABLET | ORAL | 0 refills | Status: DC

## 2021-02-26 MED ORDER — ALBUTEROL SULFATE HFA 108 (90 BASE) MCG/ACT IN AERS: 2.0000 | INHALATION_SPRAY | Freq: Four times a day (QID) | RESPIRATORY_TRACT | 1 refills | Status: AC | PRN

## 2021-02-26 NOTE — Progress Notes (Signed)
Seasonal allergies worsening with cough. Sent albuterol and medrol dose pak.

## 2021-03-26 ENCOUNTER — Other Ambulatory Visit: Payer: Self-pay | Admitting: Sports Medicine

## 2021-03-26 DIAGNOSIS — M11261 Other chondrocalcinosis, right knee: Secondary | ICD-10-CM

## 2021-03-28 ENCOUNTER — Other Ambulatory Visit: Payer: Self-pay | Admitting: Physician Assistant

## 2021-03-28 DIAGNOSIS — N139 Obstructive and reflux uropathy, unspecified: Secondary | ICD-10-CM

## 2021-04-12 ENCOUNTER — Other Ambulatory Visit: Payer: Self-pay | Admitting: Physician Assistant

## 2021-06-05 ENCOUNTER — Other Ambulatory Visit: Payer: Self-pay | Admitting: Physician Assistant

## 2021-06-05 DIAGNOSIS — N139 Obstructive and reflux uropathy, unspecified: Secondary | ICD-10-CM

## 2021-06-13 ENCOUNTER — Other Ambulatory Visit: Payer: Self-pay | Admitting: Physician Assistant

## 2021-06-30 ENCOUNTER — Other Ambulatory Visit: Payer: Self-pay | Admitting: Physician Assistant

## 2021-07-02 ENCOUNTER — Ambulatory Visit (INDEPENDENT_AMBULATORY_CARE_PROVIDER_SITE_OTHER): Admitting: Physician Assistant

## 2021-07-02 ENCOUNTER — Encounter: Payer: Self-pay | Admitting: Physician Assistant

## 2021-07-02 ENCOUNTER — Other Ambulatory Visit: Payer: Self-pay

## 2021-07-02 VITALS — BP 119/87 | HR 85 | Ht 72.0 in | Wt 248.0 lb

## 2021-07-02 DIAGNOSIS — H66001 Acute suppurative otitis media without spontaneous rupture of ear drum, right ear: Secondary | ICD-10-CM

## 2021-07-02 DIAGNOSIS — J0100 Acute maxillary sinusitis, unspecified: Secondary | ICD-10-CM | POA: Insufficient documentation

## 2021-07-02 DIAGNOSIS — H9201 Otalgia, right ear: Secondary | ICD-10-CM

## 2021-07-02 MED ORDER — AMOXICILLIN-POT CLAVULANATE 875-125 MG PO TABS: 1.0000 | ORAL_TABLET | Freq: Two times a day (BID) | ORAL | 0 refills | Status: DC

## 2021-07-02 NOTE — Patient Instructions (Signed)
Otitis Media, Adult  Otitis media is a condition in which the middle ear is red and swollen (inflamed) and full of fluid. The middle ear is the part of the ear that contains bones for hearing as well as air that helps send sounds to the brain. The conditionusually goes away on its own. What are the causes? This condition is caused by a blockage in the eustachian tube. The eustachian tube connects the middle ear to the back of the nose. It normally allows air into the middle ear. The blockage is caused by fluid or swelling. Problems that can cause blockage include: A cold or infection that affects the nose, mouth, or throat. Allergies. An irritant, such as tobacco smoke. Adenoids that have become large. The adenoids are soft tissue located in the back of the throat, behind the nose and the roof of the mouth. Growth or swelling in the upper part of the throat, just behind the nose (nasopharynx). Damage to the ear caused by change in pressure. This is called barotrauma. What are the signs or symptoms? Symptoms of this condition include: Ear pain. Fever. Problems with hearing. Being tired. Fluid leaking from the ear. Ringing in the ear. How is this treated? This condition can go away on its own within 3-5 days. But if the condition is caused by bacteria or does not go away on its own, or if it keeps coming back, your doctor may: Give you antibiotic medicines. Give you medicines for pain. Follow these instructions at home: Take over-the-counter and prescription medicines only as told by your doctor. If you were prescribed an antibiotic medicine, take it as told by your doctor. Do not stop taking the antibiotic even if you start to feel better. Keep all follow-up visits as told by your doctor. This is important. Contact a doctor if: You have bleeding from your nose. There is a lump on your neck. You are not feeling better in 5 days. You feel worse instead of better. Get help right away  if: You have pain that is not helped with medicine. You have swelling, redness, or pain around your ear. You get a stiff neck. You cannot move part of your face (paralysis). You notice that the bone behind your ear hurts when you touch it. You get a very bad headache. Summary Otitis media means that the middle ear is red, swollen, and full of fluid. This condition usually goes away on its own. If the problem does not go away, treatment may be needed. You may be given medicines to treat the infection or to treat your pain. If you were prescribed an antibiotic medicine, take it as told by your doctor. Do not stop taking the antibiotic even if you start to feel better. Keep all follow-up visits as told by your doctor. This is important. This information is not intended to replace advice given to you by your health care provider. Make sure you discuss any questions you have with your healthcare provider. Document Revised: 09/29/2019 Document Reviewed: 09/29/2019 Elsevier Patient Education  2022 [employer].

## 2021-07-02 NOTE — Progress Notes (Signed)
Subjective:    Patient ID: John Sharp, male    DOB: 1961-05-05, 60 y.o.   MRN: [id number]  HPI John Sharp is a 60 yo male who presents to the clinic with cough, sinus pressure, right ear pain, headache. Denies any fever, chills, body aches, SOB. Taking OtC tylenol cold sinus severe with some relief but right ear is worsening and John Sharp has " a lot of pressure and pain".   .. 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   GERD (gastroesophageal reflux disease) 05/13/2012   Erectile dysfunction 05/24/2012   OSA on CPAP 06/07/2012   Depression 07/29/2013   Medial epicondylitis of right elbow 03/10/2014   Primary osteoarthritis of both knees 04/28/2014   Essential tremor 09/19/2016   Swelling of arm 06/16/2017   Trochanteric bursitis, left hip 06/16/2017   Obstructive uropathy 06/16/2017   Seasonal allergies 06/10/2018   Pseudogout of right prepatellar bursa 09/06/2018   Status post ORIF of fracture of ankle 10/11/2018   Septic prepatellar bursitis of right knee 10/13/2018   Musculoskeletal neck pain 04/06/2019   Radiculitis of left cervical region 06/22/2019   Lumbar spondylosis 03/30/2020   Hyperlipidemia 06/25/2020   Hypertriglyceridemia 06/25/2020   Acute foreign body of right ear canal 08/14/2020   Frequent headaches 01/08/2021   Acute abdominal pain 01/08/2021   Right ear pain 07/02/2021   Acute non-recurrent maxillary sinusitis 07/02/2021   Resolved Ambulatory Problems    Diagnosis Date Noted   Cough 01/06/2014   Bilateral ankle pain 05/10/2014   Elevated PSA 01/08/2015   Increased laboratory test result 01/08/2015   No energy 08/03/2015   Past Medical History:  Diagnosis Date   Prepatellar bursitis, right knee 09/06/2018     Review of Systems See HPI.    Objective:   Physical Exam Vitals reviewed.  Constitutional:      Appearance: Normal appearance. John Sharp is obese.  HENT:     Head: Normocephalic.     Right Ear: Ear  canal and external ear normal. There is no impacted cerumen.     Left Ear: Tympanic membrane, ear canal and external ear normal. There is no impacted cerumen.     Ears:     Comments: Right TM no light reflex, erythematous bulging TM.  Neck:     Vascular: No carotid bruit.  Cardiovascular:     Rate and Rhythm: Normal rate and regular rhythm.     Pulses: Normal pulses.  Pulmonary:     Effort: Pulmonary effort is normal.     Breath sounds: Normal breath sounds.  Musculoskeletal:     Cervical back: Neck supple. No tenderness.  Lymphadenopathy:     Cervical: No cervical adenopathy.  Neurological:     General: No focal deficit present.     Mental Status: John Sharp is alert and oriented to person, place, and time.  Psychiatric:        Mood and Affect: Mood normal.          Assessment & Plan:  Marland KitchenMarland KitchenElhadji was seen today for ear pain.  Diagnoses and all orders for this visit:  Right ear pain  Acute non-recurrent maxillary sinusitis -     amoxicillin-clavulanate (AUGMENTIN) 875-125 MG tablet; Take 1 tablet by mouth 2 (two) times daily.  Non-recurrent acute suppurative otitis media of right ear without spontaneous rupture of tympanic membrane -     amoxicillin-clavulanate (AUGMENTIN) 875-125 MG tablet; Take 1 tablet by mouth 2 (two) times daily.  Right  ear infection with sinus symptoms. Treated with augmentin. Please add OTC flonase. Stay hydrated. Ok to continue mucinex/dextromorphan for cough.

## 2021-07-08 ENCOUNTER — Other Ambulatory Visit: Payer: Self-pay | Admitting: Physician Assistant

## 2021-07-08 MED ORDER — METHYLPREDNISOLONE 4 MG PO TBPK: ORAL_TABLET | ORAL | 0 refills | Status: DC

## 2021-07-08 NOTE — Progress Notes (Signed)
Continues to have ear pressure. Sent medrol dose pack.

## 2021-07-22 ENCOUNTER — Other Ambulatory Visit: Payer: Self-pay | Admitting: Physician Assistant

## 2021-07-22 DIAGNOSIS — J302 Other seasonal allergic rhinitis: Secondary | ICD-10-CM

## 2021-08-05 ENCOUNTER — Other Ambulatory Visit: Payer: Self-pay | Admitting: Physician Assistant

## 2021-08-10 IMAGING — DX DG LUMBAR SPINE COMPLETE 4+V
5 series · 5 of 5 positions shown · non-contrast
Comparison: None.

CLINICAL DATA: Low back pain.

EXAM:
LUMBAR SPINE - COMPLETE 4+ VIEW

[l-spine ap]
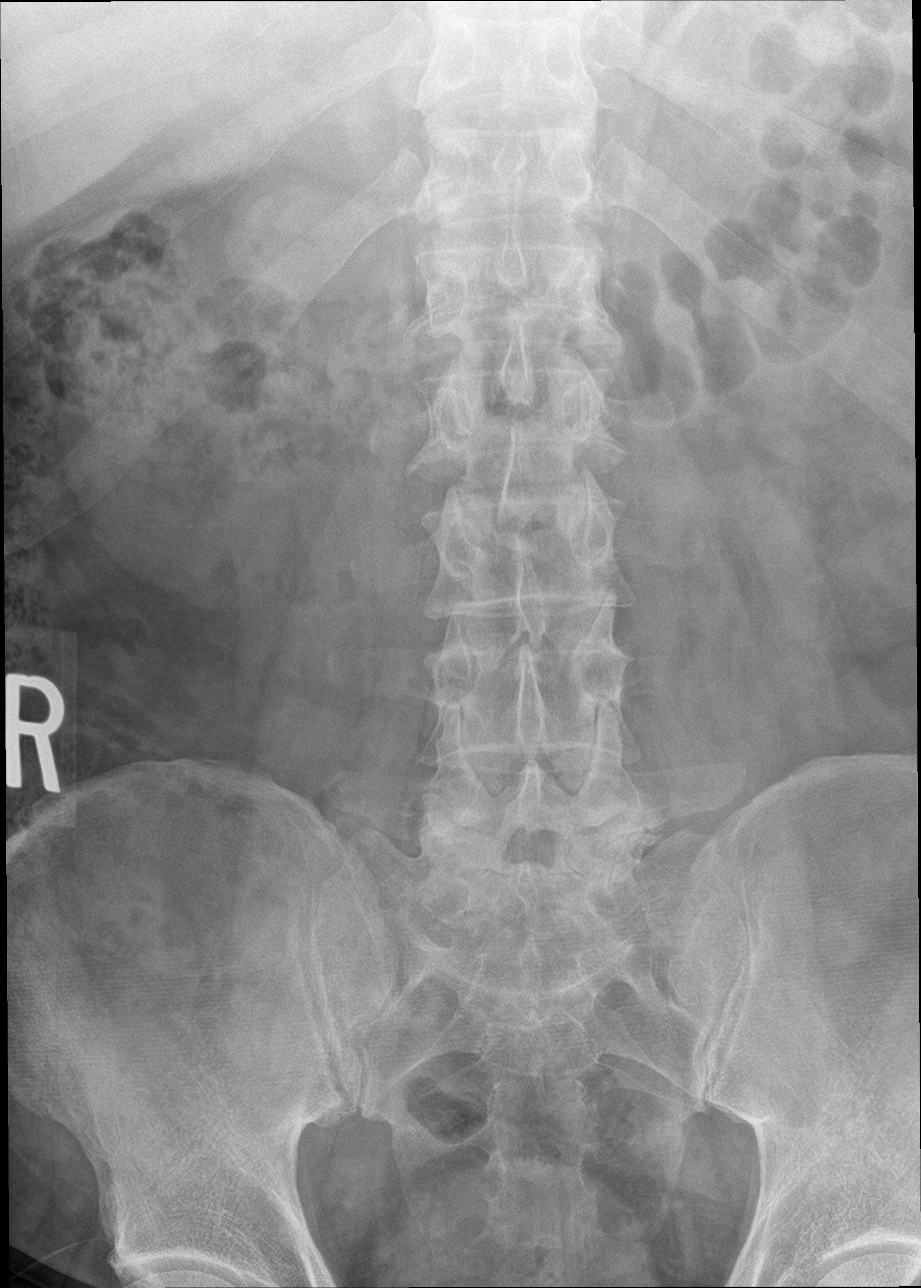

[l-spine obl (1 of 2)]
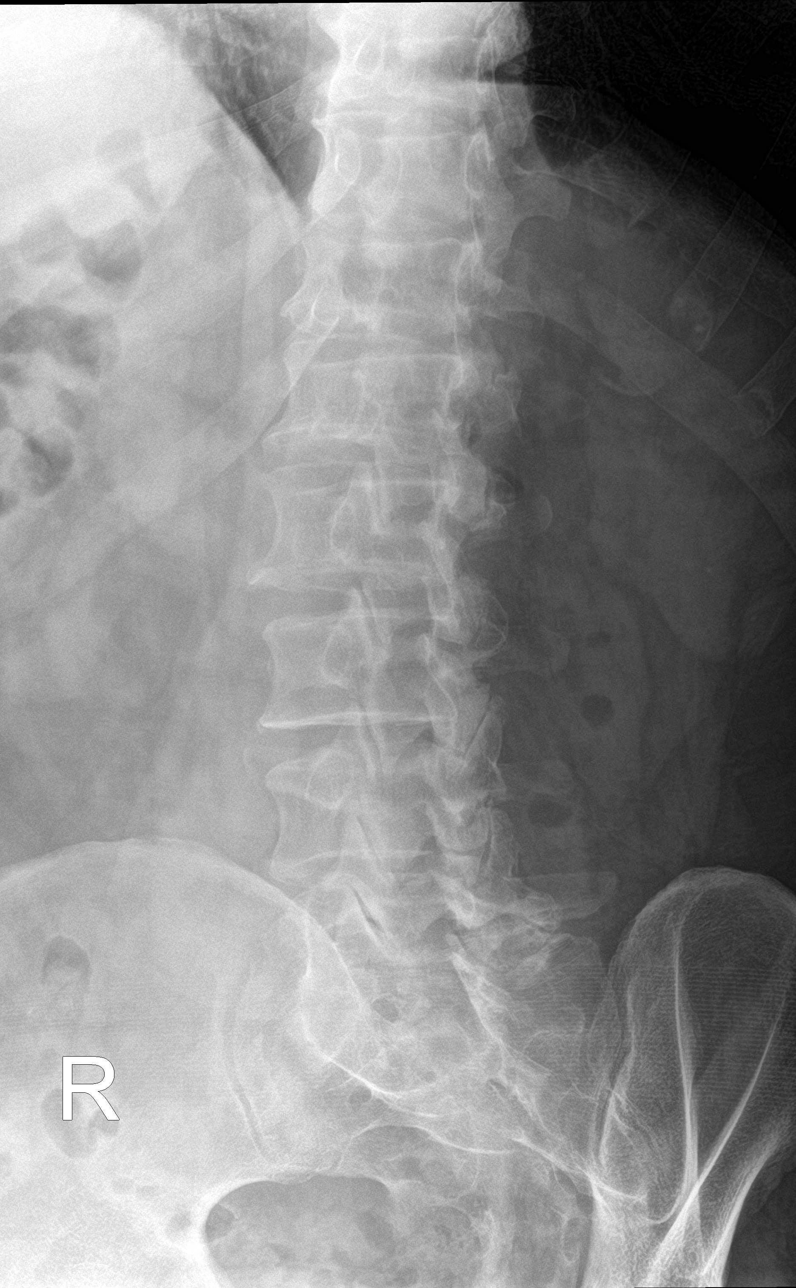

[l-spine obl (2 of 2)]
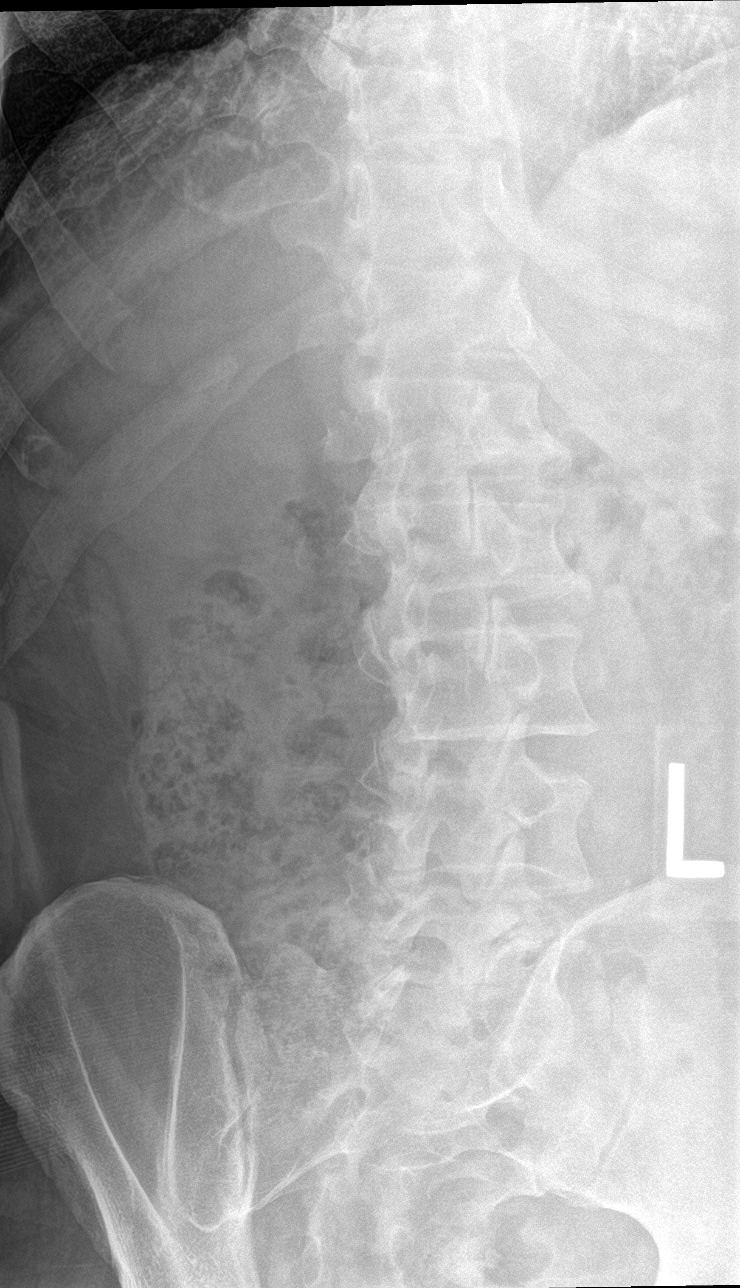

[l-spine lat]
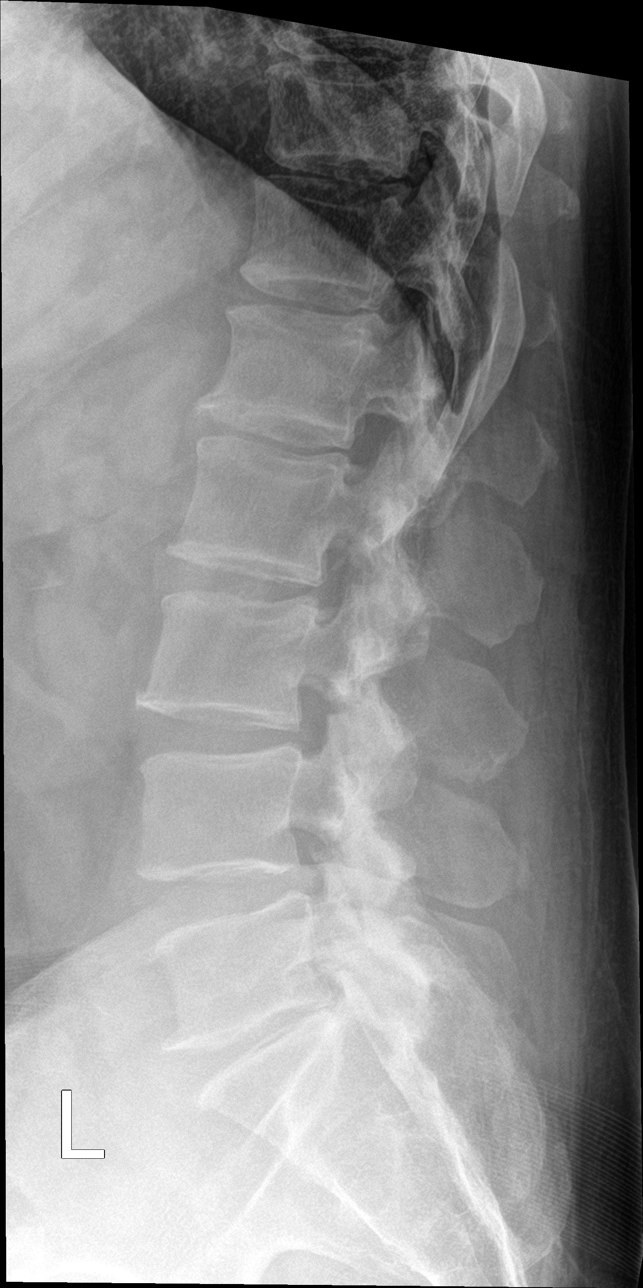

[l-spine spot]
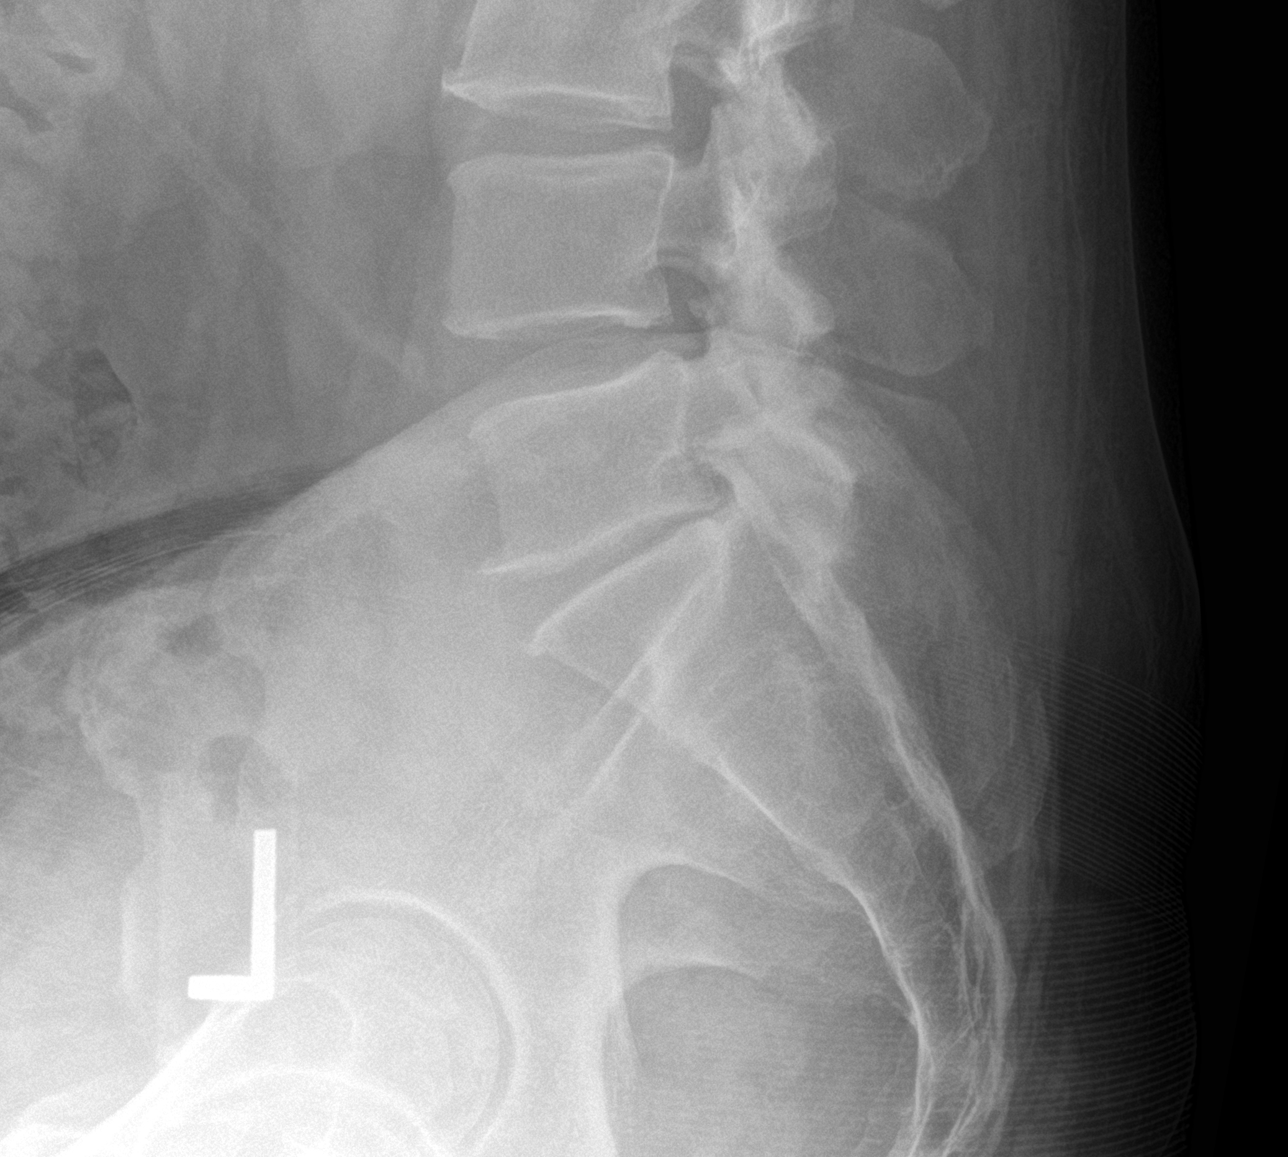

[5 of 5 positions shown; findings below may reference images not displayed]

FINDINGS: There is no acute displaced fracture or malalignment. Mild
multilevel disc height loss is noted, greatest at the L5-S1 and
L1-L2 levels. There is facet arthrosis at the lower lumbar segments.
IMPRESSION: No acute displaced fracture or malalignment. Mild multilevel disc
height loss and facet arthrosis.

## 2021-08-12 ENCOUNTER — Other Ambulatory Visit: Payer: Self-pay | Admitting: Physician Assistant

## 2021-08-12 DIAGNOSIS — R051 Acute cough: Secondary | ICD-10-CM

## 2021-08-12 MED ORDER — HYDROCOD POLST-CPM POLST ER 10-8 MG/5ML PO SUER: 5.0000 mL | Freq: Two times a day (BID) | ORAL | 0 refills | Status: DC | PRN

## 2021-08-12 NOTE — Progress Notes (Signed)
Cough intermittently and when he gets URI. Helps sleep with cough. No fever, chills, SOB.  ..PDMP reviewed during this encounter. Tussionex sent.

## 2021-08-13 ENCOUNTER — Other Ambulatory Visit: Payer: Self-pay

## 2021-08-13 ENCOUNTER — Ambulatory Visit (INDEPENDENT_AMBULATORY_CARE_PROVIDER_SITE_OTHER)

## 2021-08-13 VITALS — BP 126/81 | HR 80 | Temp 98.2°F

## 2021-08-13 DIAGNOSIS — Z23 Encounter for immunization: Secondary | ICD-10-CM | POA: Diagnosis not present

## 2021-08-13 NOTE — Progress Notes (Signed)
Patient presents today for influenza vaccination.    HA: Yes Dizziness/lightheadedness: No BA: No Weakness/Fatigue: No  Sinus pain/pressure: No  Runny nose: No  ST: No  ShOB: No  CP: No  Palps: No Abd pain: No Dysuria: No  N/V/C/D: No   Latex allergy?: No Allergic to chicken/eggs:: No Ever had a severe reaction to the flu shot before?: No Had a fever within the last 24 hours?: No History of Guillain-Barre syndrome?: No    Immunization given IM in LD. Pt tolerated immunization well without complications.   Patient advised to take Tylenol as needed if mild symptoms, such as body aches, should occur. Advised patient that if worse symptoms should arise, to give our office a call. Advised patient if ShOB or any symptom indicating a possible anaphylactic reaction should occur, to go immediately to the nearest emergency department. Patient verbalized understanding.

## 2021-08-13 NOTE — Patient Instructions (Signed)
Influenza (Flu) Vaccine (Inactivated or Recombinant): What You Need to Know 1. Why get vaccinated? Influenza vaccine can prevent influenza (flu). Flu is a contagious disease that spreads around the [place] every year, usually between October and May. Anyone can get the flu, but it is more dangerous for some people. Infants and young children, people 65 years and older, pregnant people, and people with certain health conditions or a weakened immune system are at greatest risk of flu complications. Pneumonia, bronchitis, sinus infections, and ear infections are examples of flu-related complications. If you have a medical condition, such as heart disease, cancer, or diabetes, flu can make it worse. Flu can cause fever and chills, sore throat, muscle aches, fatigue, cough, headache, and runny or stuffy nose. Some people may have vomiting and diarrhea, though this is more common in children than adults. In an average year, thousands of people in the [place] die from flu, and many more are hospitalized. Flu vaccine prevents millions of illnesses and flu-related visits to the doctor each year. 2. Influenza vaccines CDC recommends everyone 6 months and older get vaccinated every flu season. Children 6 months through 8 years of age may need 2 doses during a single flu season. Everyone else needs only 1 dose each flu season. It takes about 2 weeks for protection to develop after vaccination. There are many flu viruses, and they are always changing. Each year a new flu vaccine is made to protect against the influenza viruses believed to be likely to cause disease in the upcoming flu season. Even when the vaccine doesn't exactly match these viruses, it may still provide some protection. Influenza vaccine does not cause flu. Influenza vaccine may be given at the same time as other vaccines. 3. Talk with your health care provider Tell your vaccination provider if the person getting the vaccine: Has had  an allergic reaction after a previous dose of influenza vaccine, or has any severe, life-threatening allergies Has ever had Guillain-Barr Syndrome (also called "GBS") In some cases, your health care provider may decide to postpone influenza vaccination until a future visit. Influenza vaccine can be administered at any time during pregnancy. People who are or will be pregnant during influenza season should receive inactivated influenza vaccine. People with minor illnesses, such as a cold, may be vaccinated. People who are moderately or severely ill should usually wait until they recover before getting influenza vaccine. Your health care provider can give you more information. 4. Risks of a vaccine reaction Soreness, redness, and swelling where the shot is given, fever, muscle aches, and headache can happen after influenza vaccination. There may be a very small increased risk of Guillain-Barr Syndrome (GBS) after inactivated influenza vaccine (the flu shot). Young children who get the flu shot along with pneumococcal vaccine (PCV13) and/or DTaP vaccine at the same time might be slightly more likely to have a seizure caused by fever. Tell your health care provider if a child who is getting flu vaccine has ever had a seizure. People sometimes faint after medical procedures, including vaccination. Tell your provider if you feel dizzy or have vision changes or ringing in the ears. As with any medicine, there is a very remote chance of a vaccine causing a severe allergic reaction, other serious injury, or death. 5. What if there is a serious problem? An allergic reaction could occur after the vaccinated person leaves the clinic. If you see signs of a severe allergic reaction (hives, swelling of the face and throat, difficulty breathing,   a fast heartbeat, dizziness, or weakness), call 9-1-1 and get the person to the nearest hospital. For other signs that concern you, call your health care provider. Adverse  reactions should be reported to the Vaccine Adverse Event Reporting System (VAERS). Your health care provider will usually file this report, or you can do it yourself. Visit the VAERS website at www.vaers.hhs.gov or call 1-800-822-7967. VAERS is only for reporting reactions, and VAERS staff members do not give medical advice. 6. The National Vaccine Injury Compensation Program The National Vaccine Injury Compensation Program (VICP) is a federal program that was created to compensate people who may have been injured by certain vaccines. Claims regarding alleged injury or death due to vaccination have a time limit for filing, which may be as short as two years. Visit the VICP website at www.hrsa.gov/vaccinecompensation or call 1-800-338-2382 to learn about the program and about filing a claim. 7. How can I learn more? Ask your health care provider. Call your local or state health department. Visit the website of the Food and Drug Administration (FDA) for vaccine package inserts and additional information at www.fda.gov/vaccines-blood-biologics/vaccines. Contact the Centers for Disease Control and Prevention (CDC): Call 1-800-232-4636 (1-800-CDC-INFO) or Visit CDC's website at www.cdc.gov/flu. Vaccine Information Statement Inactivated Influenza Vaccine (06/15/2020) This information is not intended to replace advice given to you by your health care provider. Make sure you discuss any questions you have with your health care provider. Document Revised: 08/02/2020 Document Reviewed: 08/02/2020 Elsevier Patient Education  2022 Else[employer] Inc.  

## 2021-08-15 ENCOUNTER — Other Ambulatory Visit: Payer: Self-pay | Admitting: Physician Assistant

## 2021-08-20 ENCOUNTER — Ambulatory Visit (INDEPENDENT_AMBULATORY_CARE_PROVIDER_SITE_OTHER): Admitting: Medical-Surgical

## 2021-08-20 ENCOUNTER — Other Ambulatory Visit: Payer: Self-pay

## 2021-08-20 VITALS — BP 124/96 | HR 73 | Temp 98.0°F

## 2021-08-20 DIAGNOSIS — Z23 Encounter for immunization: Secondary | ICD-10-CM

## 2021-08-20 NOTE — Patient Instructions (Signed)
Pneumococcal Conjugate Vaccine: What You Need to Know 1. Why get vaccinated? Pneumococcal conjugate vaccine can prevent pneumococcal disease. Pneumococcal disease refers to any illness caused by pneumococcal bacteria. These bacteria can cause many types of illnesses, including pneumonia, which is an infection of the lungs. Pneumococcal bacteria are one of the most common causes of pneumonia. Besides pneumonia, pneumococcal bacteria can also cause: Ear infections Sinus infections Meningitis (infection of the tissue covering the brain and spinal cord) Bacteremia (infection of the blood) Anyone can get pneumococcal disease, but children under 55 years old, people with certain medical conditions or other risk factors, and adults 40 years or older are at the highest risk. Most pneumococcal infections are mild. However, some can result in long-term problems, such as brain damage or hearing loss. Meningitis, bacteremia, and pneumonia caused by pneumococcal disease can be fatal. 2. Pneumococcal conjugate vaccine Pneumococcal conjugate vaccine helps protect against bacteria that cause pneumococcal disease. There are three pneumococcal conjugate vaccines (PCV13, PCV15, and PCV20). The different vaccines are recommended for different people based on their age and medical status. PCV13 Infants and young children usually need 4 doses of PCV13, at ages 5, 110, 48, and 12-15 months. Older children (through age 19 months) may be vaccinated with PCV13 if they did not receive the recommended doses. Children and adolescents 74-40 years of age with certain medical conditions should receive a single dose of PCV13 if they did not already receive PCV13. PCV15 or PCV20 Adults 42 through 59 years old with certain medical conditions or other risk factors who have not already received a pneumococcal conjugate vaccine should receive either: a single dose of PCV15 followed by a dose of pneumococcal polysaccharide vaccine (PPSV23),  or a single dose of PCV20. Adults 36 years or older who have not already received a pneumococcal conjugate vaccine should receive either: a single dose of PCV15 followed by a dose of PPSV23, or a single dose of PCV20. Your health care provider can give you more information. 3. Talk with your health care provider Tell your vaccination provider if the person getting the vaccine: Has had an allergic reaction after a previous dose of any type of pneumococcal conjugate vaccine (PCV13, PCV15, PCV20, or an earlier pneumococcal conjugate vaccine known as PCV7), or to any vaccine containing diphtheria toxoid (for example, DTaP), or has any severe, life-threatening allergies In some cases, your health care provider may decide to postpone pneumococcal conjugate vaccination until a future visit. People with minor illnesses, such as a cold, may be vaccinated. People who are moderately or severely ill should usually wait until they recover. Your health care provider can give you more information. 4. Risks of a vaccine reaction Redness, swelling, pain, or tenderness where the shot is given, and fever, loss of appetite, fussiness (irritability), feeling tired, headache, muscle aches, joint pain, and chills can happen after pneumococcal conjugate vaccination. Young children may be at increased risk for seizures caused by fever after PCV13 if it is administered at the same time as inactivated influenza vaccine. Ask your health care provider for more information. People sometimes faint after medical procedures, including vaccination. Tell your provider if you feel dizzy or have vision changes or ringing in the ears. As with any medicine, there is a very remote chance of a vaccine causing a severe allergic reaction, other serious injury, or death. 5. What if there is a serious problem? An allergic reaction could occur after the vaccinated person leaves the clinic. If you see signs of a severe allergic  reaction (hives,  swelling of the face and throat, difficulty breathing, a fast heartbeat, dizziness, or weakness), call 9-1-1 and get the person to the nearest hospital. For other signs that concern you, call your health care provider. Adverse reactions should be reported to the Vaccine Adverse Event Reporting System (VAERS). Your health care provider will usually file this report, or you can do it yourself. Visit the VAERS website at www.vaers.SamedayNews.es or call 620-631-8497. VAERS is only for reporting reactions, and VAERS staff members do not give medical advice. 6. The National Vaccine Injury Compensation Program The [employer] Vaccine Injury Compensation Program (VICP) is a federal program that was created to compensate people who may have been injured by certain vaccines. Claims regarding alleged injury or death due to vaccination have a time limit for filing, which may be as short as two years. Visit the VICP website at GoldCloset.com.ee or call 475-431-6378 to learn about the program and about filing a claim. 7. How can I learn more? Ask your health care provider. Call your local or state health department. Visit the website of the Food and Drug Administration (FDA) for vaccine package inserts and additional information at TraderRating.uy. Contact the Centers for Disease Control and Prevention (CDC): Call (812)137-4791 (1-800-CDC-INFO) or Visit CDC's website at http://hunter.com/. Vaccine Information Statement (Interim) Pneumococcal Conjugate Vaccine (12/14/2020) This information is not intended to replace advice given to you by your health care provider. Make sure you discuss any questions you have with your health care provider. Document Revised: 12/28/2020 Document Reviewed: 12/28/2020 Elsevier Patient Education  2022 [employer].

## 2021-08-20 NOTE — Progress Notes (Signed)
Patient presents today for Prevnar 20 immunization. This is the first of one injections.   Patient denies CP, palpitations, ShOB, abd pain, headache, mood swings.   Immunization given IM in LD. Pt tolerated immunization well without complications. Advised pt to contact us should any complications arise.

## 2021-09-02 ENCOUNTER — Ambulatory Visit (INDEPENDENT_AMBULATORY_CARE_PROVIDER_SITE_OTHER): Admitting: Physician Assistant

## 2021-09-02 ENCOUNTER — Other Ambulatory Visit: Payer: Self-pay

## 2021-09-02 VITALS — BP 139/84 | HR 93 | Temp 98.4°F

## 2021-09-02 DIAGNOSIS — G4733 Obstructive sleep apnea (adult) (pediatric): Secondary | ICD-10-CM | POA: Diagnosis not present

## 2021-09-02 DIAGNOSIS — R0981 Nasal congestion: Secondary | ICD-10-CM | POA: Diagnosis not present

## 2021-09-02 DIAGNOSIS — J40 Bronchitis, not specified as acute or chronic: Secondary | ICD-10-CM

## 2021-09-02 DIAGNOSIS — J329 Chronic sinusitis, unspecified: Secondary | ICD-10-CM | POA: Diagnosis not present

## 2021-09-02 DIAGNOSIS — H6983 Other specified disorders of Eustachian tube, bilateral: Secondary | ICD-10-CM | POA: Diagnosis not present

## 2021-09-02 DIAGNOSIS — Z9989 Dependence on other enabling machines and devices: Secondary | ICD-10-CM

## 2021-09-02 MED ORDER — DOXYCYCLINE HYCLATE 100 MG PO TABS: 100.0000 mg | ORAL_TABLET | Freq: Two times a day (BID) | ORAL | 0 refills | Status: DC

## 2021-09-02 MED ORDER — METHYLPREDNISOLONE 4 MG PO TBPK: ORAL_TABLET | ORAL | 0 refills | Status: DC

## 2021-09-02 MED ORDER — ATORVASTATIN CALCIUM 20 MG PO TABS: 20.0000 mg | ORAL_TABLET | Freq: Every day | ORAL | 3 refills | Status: DC

## 2021-09-02 NOTE — Patient Instructions (Addendum)
Stay on singulair and (zyrtec/allegra/claritin) Continue to use flonase 2 sprays each nostril daily.   Start doxycycline and medrol dose pack.

## 2021-09-02 NOTE — Progress Notes (Signed)
Subjective:    Patient ID: John Sharp, male    DOB: 1961-06-09, 60 y.o.   MRN: [id number]  HPI Patient is a 60 year old male with OSA, hypogonadism, GERD, chronic congestion who presents to the clinic for discussion of chronic symptoms worsening.  Since fall started he has had congestion.  He has slowly worsened over time.  At 1 point he did get prednisone and an antibiotic and symptoms mostly went away but has since come back.  He has a lot of head congestion.  He has some right ear pain radiating into jaw.  He admits he is not using his sleep apnea machine.  He denies any fever, chills, body aches, nausea, vomiting.  He has been taking Tylenol Cold sinus severe with little improvement.  His cough is mild to moderate with production. .. 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   GERD (gastroesophageal reflux disease) 05/13/2012   Erectile dysfunction 05/24/2012   OSA on CPAP 06/07/2012   Depression 07/29/2013   Medial epicondylitis of right elbow 03/10/2014   Primary osteoarthritis of both knees 04/28/2014   Essential tremor 09/19/2016   Swelling of arm 06/16/2017   Trochanteric bursitis, left hip 06/16/2017   Obstructive uropathy 06/16/2017   Seasonal allergies 06/10/2018   Pseudogout of right prepatellar bursa 09/06/2018   Status post ORIF of fracture of ankle 10/11/2018   Septic prepatellar bursitis of right knee 10/13/2018   Musculoskeletal neck pain 04/06/2019   Radiculitis of left cervical region 06/22/2019   Lumbar spondylosis 03/30/2020   Hyperlipidemia 06/25/2020   Hypertriglyceridemia 06/25/2020   Acute foreign body of right ear canal 08/14/2020   Frequent headaches 01/08/2021   Acute abdominal pain 01/08/2021   Right ear pain 07/02/2021   Acute non-recurrent maxillary sinusitis 07/02/2021   Chronic nasal congestion 09/03/2021   Resolved Ambulatory Problems    Diagnosis Date Noted   Cough 01/06/2014   Bilateral  ankle pain 05/10/2014   Elevated PSA 01/08/2015   Increased laboratory test result 01/08/2015   No energy 08/03/2015   Past Medical History:  Diagnosis Date   Prepatellar bursitis, right knee 09/06/2018     Review of Systems See HPI.     Objective:   Physical Exam Vitals reviewed.  Constitutional:      Appearance: Normal appearance.  HENT:     Head: Normocephalic.     Right Ear: Ear canal and external ear normal. There is no impacted cerumen.     Left Ear: Ear canal and external ear normal. There is no impacted cerumen.     Ears:     Comments: Bilateral TMs with effusions from 3 oclock to 8oclock No TMJ tenderness.     Nose: Congestion present.     Mouth/Throat:     Mouth: Mucous membranes are moist.     Pharynx: Posterior oropharyngeal erythema present.  Eyes:     Extraocular Movements: Extraocular movements intact.     Conjunctiva/sclera: Conjunctivae normal.     Pupils: Pupils are equal, round, and reactive to light.  Cardiovascular:     Rate and Rhythm: Normal rate and regular rhythm.     Pulses: Normal pulses.     Heart sounds: Normal heart sounds.  Pulmonary:     Effort: Pulmonary effort is normal.     Breath sounds: Normal breath sounds.     Comments: Productive cough on exam Musculoskeletal:     Cervical back: Normal range of motion.  Lymphadenopathy:  Cervical: No cervical adenopathy.  Neurological:     General: No focal deficit present.     Mental Status: He is alert and oriented to person, place, and time.  Psychiatric:        Mood and Affect: Mood normal.          Assessment & Plan:   Marland KitchenMarland KitchenLamine was seen today for jaw pain.  Diagnoses and all orders for this visit:  Sinobronchitis -     doxycycline (VIBRA-TABS) 100 MG tablet; Take 1 tablet (100 mg total) by mouth 2 (two) times daily. -     methylPREDNISolone (MEDROL DOSEPAK) 4 MG TBPK tablet; Take as directed by package insert.  Chronic nasal congestion -     methylPREDNISolone (MEDROL  DOSEPAK) 4 MG TBPK tablet; Take as directed by package insert.  OSA on CPAP  Other orders -     atorvastatin (LIPITOR) 20 MG tablet; Take 1 tablet (20 mg total) by mouth daily.  Negative for covid.  No symptoms of flu.  For ongoing congestion.  Stay on singulair and (zyrtec/allegra/claritin) Continue to use flonase 2 sprays each nostril daily.  For sinobronchitis Start doxycycline and medrol dose pack.  Start back using OSA.

## 2021-09-03 ENCOUNTER — Encounter: Payer: Self-pay | Admitting: Physician Assistant

## 2021-09-03 DIAGNOSIS — R0981 Nasal congestion: Secondary | ICD-10-CM | POA: Insufficient documentation

## 2021-09-03 DIAGNOSIS — H6983 Other specified disorders of Eustachian tube, bilateral: Secondary | ICD-10-CM | POA: Insufficient documentation

## 2021-11-07 ENCOUNTER — Other Ambulatory Visit: Payer: Self-pay | Admitting: Neurology

## 2021-11-07 MED ORDER — ATORVASTATIN CALCIUM 20 MG PO TABS: 20.0000 mg | ORAL_TABLET | Freq: Every day | ORAL | 0 refills | Status: DC

## 2021-12-27 ENCOUNTER — Other Ambulatory Visit: Payer: Self-pay | Admitting: Physician Assistant

## 2021-12-27 MED ORDER — METHOCARBAMOL 500 MG PO TABS: 500.0000 mg | ORAL_TABLET | Freq: Three times a day (TID) | ORAL | 0 refills | Status: DC

## 2021-12-27 MED ORDER — LIDOCAINE 5 % EX PTCH: 1.0000 | MEDICATED_PATCH | Freq: Two times a day (BID) | CUTANEOUS | 1 refills | Status: DC

## 2021-12-27 NOTE — Progress Notes (Signed)
Low back pain flare sent lidocaine patches and muscle relaxers.

## 2021-12-31 ENCOUNTER — Ambulatory Visit (INDEPENDENT_AMBULATORY_CARE_PROVIDER_SITE_OTHER): Admitting: Physician Assistant

## 2021-12-31 ENCOUNTER — Other Ambulatory Visit: Payer: Self-pay

## 2021-12-31 VITALS — BP 100/83 | HR 104

## 2021-12-31 DIAGNOSIS — J302 Other seasonal allergic rhinitis: Secondary | ICD-10-CM | POA: Diagnosis not present

## 2021-12-31 DIAGNOSIS — H6981 Other specified disorders of Eustachian tube, right ear: Secondary | ICD-10-CM | POA: Diagnosis not present

## 2021-12-31 DIAGNOSIS — M26622 Arthralgia of left temporomandibular joint: Secondary | ICD-10-CM

## 2021-12-31 DIAGNOSIS — R052 Subacute cough: Secondary | ICD-10-CM

## 2021-12-31 MED ORDER — METHYLPREDNISOLONE 4 MG PO TBPK: ORAL_TABLET | ORAL | 0 refills | Status: DC

## 2021-12-31 MED ORDER — MELOXICAM 15 MG PO TABS: 15.0000 mg | ORAL_TABLET | Freq: Every day | ORAL | 3 refills | Status: DC

## 2021-12-31 NOTE — Progress Notes (Signed)
Subjective:    Patient ID: John Sharp, John Sharp    DOB: Nov 17, 1960, 61 y.o.   MRN: [id number]  HPI Pt is a 61 yo obese John Sharp who presents to the clinic with right ear pain and cough. Cough has been ongoing for a while. John Sharp has allergies and when John Sharp starts riding his motorcycle his cough gets worse. John Sharp admits John Sharp is not wearing his CPAP. John Sharp is not taking allergy medications as directed. Right ear hurts off and on. No fever, chills, body aches.   .. Active Ambulatory Problems    Diagnosis Date Noted   John Sharp hypogonadism 05/13/2012   GERD (gastroesophageal reflux disease) 05/13/2012   Erectile dysfunction 05/24/2012   OSA on CPAP 06/07/2012   Depression 07/29/2013   Medial epicondylitis of right elbow 03/10/2014   Primary osteoarthritis of both knees 04/28/2014   Essential tremor 09/19/2016   Swelling of arm 06/16/2017   Trochanteric bursitis, left hip 06/16/2017   Obstructive uropathy 06/16/2017   Seasonal allergies 06/10/2018   Pseudogout of right prepatellar bursa 09/06/2018   Status post ORIF of fracture of ankle 10/11/2018   Septic prepatellar bursitis of right knee 10/13/2018   Musculoskeletal neck pain 04/06/2019   Radiculitis of left cervical region 06/22/2019   Lumbar spondylosis 03/30/2020   Hyperlipidemia 06/25/2020   Hypertriglyceridemia 06/25/2020   Acute foreign body of right ear canal 08/14/2020   Frequent headaches 01/08/2021   Acute abdominal pain 01/08/2021   Right ear pain 07/02/2021   Acute non-recurrent maxillary sinusitis 07/02/2021   Chronic nasal congestion 09/03/2021   Dysfunction of both eustachian tubes 09/03/2021   Resolved Ambulatory Problems    Diagnosis Date Noted   Cough 01/06/2014   Bilateral ankle pain 05/10/2014   Elevated PSA 01/08/2015   Increased laboratory test result 01/08/2015   No energy 08/03/2015   Past Medical History:  Diagnosis Date   Prepatellar bursitis, right knee 09/06/2018     Review of Systems    See HPI.   Objective:   Physical Exam Vitals reviewed.  Constitutional:      Appearance: Normal appearance. John Sharp is obese.  HENT:     Head: Normocephalic.     Right Ear: Ear canal and external ear normal. There is no impacted cerumen.     Left Ear: Tympanic membrane, ear canal and external ear normal. There is no impacted cerumen.     Ears:     Comments: Right ear mild effusion    Mouth/Throat:     Mouth: Mucous membranes are moist.     Pharynx: No posterior oropharyngeal erythema.     Comments: Tenderness over left TMJ.  Eyes:     Conjunctiva/sclera: Conjunctivae normal.  Cardiovascular:     Rate and Rhythm: Normal rate and regular rhythm.     Pulses: Normal pulses.     Heart sounds: Normal heart sounds.  Pulmonary:     Effort: Pulmonary effort is normal.     Breath sounds: Normal breath sounds. No wheezing or rhonchi.  Musculoskeletal:     Right lower leg: No edema.     Left lower leg: No edema.  Neurological:     General: No focal deficit present.     Mental Status: John Sharp is alert and oriented to person, place, and time.  Psychiatric:        Mood and Affect: Mood normal.          Assessment & Plan:  Marland KitchenMarland KitchenZailyn was seen today for ear problem.  Diagnoses and all  orders for this visit:  ETD (Eustachian tube dysfunction), right -     methylPREDNISolone (MEDROL DOSEPAK) 4 MG TBPK tablet; Take as directed by package insert.  TMJ tenderness, left -     meloxicam (MOBIC) 15 MG tablet; Take 1 tablet (15 mg total) by mouth daily.  Subacute cough  Seasonal allergies   Start zyrtec/claritin/allegra WITH singulair.  WEAR CPAP. Use flonase 2 sprays each nostril  Medrol dose pack if needed if right ear pain if not improving with flonase.

## 2021-12-31 NOTE — Patient Instructions (Addendum)
Start zyrtec/claritin/allegra WITH singulair.  WEAR CPAP. Use flonase 2 sprays each nostril  Medrol dose pack if needed.

## 2022-01-01 ENCOUNTER — Encounter: Payer: Self-pay | Admitting: Physician Assistant

## 2022-01-22 ENCOUNTER — Other Ambulatory Visit: Payer: Self-pay | Admitting: Physician Assistant

## 2022-04-07 ENCOUNTER — Other Ambulatory Visit: Payer: Self-pay | Admitting: Physician Assistant

## 2022-04-23 ENCOUNTER — Other Ambulatory Visit: Payer: Self-pay | Admitting: Physician Assistant

## 2022-04-23 DIAGNOSIS — E782 Mixed hyperlipidemia: Secondary | ICD-10-CM

## 2022-07-15 ENCOUNTER — Encounter: Payer: Self-pay | Admitting: Physician Assistant

## 2022-07-15 ENCOUNTER — Ambulatory Visit (INDEPENDENT_AMBULATORY_CARE_PROVIDER_SITE_OTHER)

## 2022-07-15 ENCOUNTER — Ambulatory Visit (INDEPENDENT_AMBULATORY_CARE_PROVIDER_SITE_OTHER): Admitting: Physician Assistant

## 2022-07-15 VITALS — BP 101/72 | HR 96 | Wt 256.0 lb

## 2022-07-15 DIAGNOSIS — M79675 Pain in left toe(s): Secondary | ICD-10-CM

## 2022-07-15 DIAGNOSIS — W19XXXA Unspecified fall, initial encounter: Secondary | ICD-10-CM

## 2022-07-15 DIAGNOSIS — S99922A Unspecified injury of left foot, initial encounter: Secondary | ICD-10-CM

## 2022-07-15 MED ORDER — TRAMADOL HCL 50 MG PO TABS: 50.0000 mg | ORAL_TABLET | Freq: Three times a day (TID) | ORAL | 0 refills | Status: DC | PRN

## 2022-07-15 MED ORDER — METHOCARBAMOL 500 MG PO TABS: 500.0000 mg | ORAL_TABLET | Freq: Three times a day (TID) | ORAL | 0 refills | Status: DC

## 2022-07-15 NOTE — Progress Notes (Signed)
No fracture or dislocation. You do have a lot of arthritis in the toe joint. Continue to ice and wear a shoe with good support.

## 2022-07-15 NOTE — Progress Notes (Signed)
Acute Office Visit  Subjective:     Patient ID: John Sharp, male    DOB: [birth year], 61 y.o.   MRN: [id number]  Chief Complaint  Patient presents with   Fall    HPI Patient is in today for fall that happened yesterday over some boxes while he was trying to put a grill together. He twisted his left great toe and landed on his left knee. He is sore in back, legs, and toe. His left toe is bruised and red. Not tried anything to make better. It is fairly persistent pain that is worse at night.   .. 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   GERD (gastroesophageal reflux disease) 05/13/2012   Erectile dysfunction 05/24/2012   OSA on CPAP 06/07/2012   Depression 07/29/2013   Medial epicondylitis of right elbow 03/10/2014   Primary osteoarthritis of both knees 04/28/2014   Essential tremor 09/19/2016   Swelling of arm 06/16/2017   Trochanteric bursitis, left hip 06/16/2017   Obstructive uropathy 06/16/2017   Seasonal allergies 06/10/2018   Pseudogout of right prepatellar bursa 09/06/2018   Status post ORIF of fracture of ankle 10/11/2018   Septic prepatellar bursitis of right knee 10/13/2018   Musculoskeletal neck pain 04/06/2019   Radiculitis of left cervical region 06/22/2019   Lumbar spondylosis 03/30/2020   Hyperlipidemia 06/25/2020   Hypertriglyceridemia 06/25/2020   Acute foreign body of right ear canal 08/14/2020   Frequent headaches 01/08/2021   Acute abdominal pain 01/08/2021   Right ear pain 07/02/2021   Acute non-recurrent maxillary sinusitis 07/02/2021   Chronic nasal congestion 09/03/2021   Dysfunction of both eustachian tubes 09/03/2021   Resolved Ambulatory Problems    Diagnosis Date Noted   Cough 01/06/2014   Bilateral ankle pain 05/10/2014   Elevated PSA 01/08/2015   Increased laboratory test result 01/08/2015   No energy 08/03/2015   Past Medical History:  Diagnosis Date   Prepatellar bursitis,  right knee 09/06/2018     ROS  See HPI.     Objective:    BP 101/72   Pulse 96   Wt 256 lb (116.1 kg)   SpO2 97%   BMI 34.72 kg/m  BP Readings from Last 3 Encounters:  07/15/22 101/72  12/31/21 100/83  09/02/21 139/84   Wt Readings from Last 3 Encounters:  07/15/22 256 lb (116.1 kg)  07/02/21 248 lb (112.5 kg)  01/25/21 248 lb (112.5 kg)      Physical Exam Musculoskeletal:     Comments: Abrasion over left knee with no edema Great left toe bruised and erythematous with tenderness to palpation over distal joint minimal swelling          Assessment & Plan:  Marland KitchenMarland KitchenMaziah was seen today for fall.  Diagnoses and all orders for this visit:  Injury of left great toe, initial encounter -     methocarbamol (ROBAXIN) 500 MG tablet; Take 1 tablet (500 mg total) by mouth 3 (three) times daily. -     traMADol (ULTRAM) 50 MG tablet; Take 1 tablet (50 mg total) by mouth every 8 (eight) hours as needed for moderate pain. -     DG Toe Great Left; Future  Fall, initial encounter -     methocarbamol (ROBAXIN) 500 MG tablet; Take 1 tablet (500 mg total) by mouth 3 (three) times daily. -     traMADol (ULTRAM) 50 MG tablet; Take 1 tablet (50 mg total) by mouth every 8 (  eight) hours as needed for moderate pain. -     DG Toe Great Left; Future  Great toe pain, left -     methocarbamol (ROBAXIN) 500 MG tablet; Take 1 tablet (500 mg total) by mouth 3 (three) times daily. -     traMADol (ULTRAM) 50 MG tablet; Take 1 tablet (50 mg total) by mouth every 8 (eight) hours as needed for moderate pain. -     DG Toe Great Left; Future   Stat xray to confirm to fracture Encouraged to ice and wear good supportive shoe Robaxin for muscle aches after fall Tramadol as needed for breakthrough pain to due NSIaDS and gastritis symptoms Follow up as needed or if symptoms persist   Iran Planas, PA-C

## 2022-07-26 ENCOUNTER — Other Ambulatory Visit: Payer: Self-pay | Admitting: Sports Medicine

## 2022-07-26 ENCOUNTER — Other Ambulatory Visit: Payer: Self-pay | Admitting: Physician Assistant

## 2022-07-26 DIAGNOSIS — N139 Obstructive and reflux uropathy, unspecified: Secondary | ICD-10-CM

## 2022-07-26 DIAGNOSIS — E782 Mixed hyperlipidemia: Secondary | ICD-10-CM

## 2022-07-26 DIAGNOSIS — M11261 Other chondrocalcinosis, right knee: Secondary | ICD-10-CM

## 2022-08-11 ENCOUNTER — Other Ambulatory Visit: Payer: Self-pay | Admitting: Physician Assistant

## 2022-08-11 DIAGNOSIS — J302 Other seasonal allergic rhinitis: Secondary | ICD-10-CM

## 2022-08-16 ENCOUNTER — Other Ambulatory Visit: Payer: Self-pay | Admitting: Physician Assistant

## 2022-08-16 DIAGNOSIS — E782 Mixed hyperlipidemia: Secondary | ICD-10-CM

## 2022-10-25 ENCOUNTER — Other Ambulatory Visit: Payer: Self-pay | Admitting: Physician Assistant

## 2022-10-25 DIAGNOSIS — N139 Obstructive and reflux uropathy, unspecified: Secondary | ICD-10-CM

## 2022-10-25 DIAGNOSIS — E782 Mixed hyperlipidemia: Secondary | ICD-10-CM

## 2022-11-05 ENCOUNTER — Other Ambulatory Visit: Payer: Self-pay | Admitting: Physician Assistant

## 2022-11-05 DIAGNOSIS — N139 Obstructive and reflux uropathy, unspecified: Secondary | ICD-10-CM

## 2022-12-09 ENCOUNTER — Telehealth: Payer: Self-pay | Admitting: Physician Assistant

## 2022-12-09 MED ORDER — HYDROCHLOROTHIAZIDE 12.5 MG PO TABS: 12.5000 mg | ORAL_TABLET | Freq: Every day | ORAL | 0 refills | Status: DC

## 2022-12-09 NOTE — Telephone Encounter (Signed)
Pt reports some lower extremity swelling. HcTZ started. Appt needed in 2 weeks to check BP and labs.

## 2022-12-26 ENCOUNTER — Other Ambulatory Visit: Payer: Self-pay | Admitting: Physician Assistant

## 2022-12-26 DIAGNOSIS — M26622 Arthralgia of left temporomandibular joint: Secondary | ICD-10-CM

## 2023-01-19 ENCOUNTER — Ambulatory Visit (INDEPENDENT_AMBULATORY_CARE_PROVIDER_SITE_OTHER): Admitting: Physician Assistant

## 2023-01-19 ENCOUNTER — Encounter: Payer: Self-pay | Admitting: Physician Assistant

## 2023-01-19 VITALS — BP 135/83 | HR 76 | Ht 72.0 in | Wt 256.0 lb

## 2023-01-19 DIAGNOSIS — L03032 Cellulitis of left toe: Secondary | ICD-10-CM

## 2023-01-19 DIAGNOSIS — L600 Ingrowing nail: Secondary | ICD-10-CM

## 2023-01-19 MED ORDER — DOXYCYCLINE HYCLATE 100 MG PO TABS: 100.0000 mg | ORAL_TABLET | Freq: Two times a day (BID) | ORAL | 0 refills | Status: DC

## 2023-01-19 NOTE — Patient Instructions (Signed)
Ingrown Toenail  An ingrown toenail occurs when the corner or sides of a toenail grow into the surrounding skin. This causes discomfort and pain. The big toe is most commonly affected, but any of the toes can be affected. If an ingrown toenail is not treated, it can become infected. What are the causes? This condition may be caused by: Wearing shoes that are too small or tight. An injury, such as stubbing your toe or having your toe stepped on. Improper cutting or care of your toenails. Having nail or foot abnormalities that were present from birth (congenital abnormalities), such as having a nail that is too big for your toe. What increases the risk? The following factors may make you more likely to develop ingrown toenails: Age. Nails tend to get thicker with age, so ingrown nails are more common among older people. Cutting your toenails incorrectly, such as cutting them very short or cutting them unevenly. An ingrown toenail is more likely to get infected if you have: Diabetes. Blood flow (circulation) problems. What are the signs or symptoms? Symptoms of an ingrown toenail may include: Pain, soreness, or tenderness. Redness. Swelling. Hardening of the skin that surrounds the toenail. Signs that an ingrown toenail may be infected include: Fluid or pus. Symptoms that get worse. How is this diagnosed? Ingrown toenails may be diagnosed based on: Your symptoms and medical history. A physical exam. Labs or tests. If you have fluid or blood coming from your toenail, a sample may be collected to test for the specific type of bacteria that is causing the infection. How is this treated? Treatment depends on the severity of your symptoms. You may be able to care for your toenail at home. If you have an infection, you may be prescribed antibiotic medicines. If you have fluid or pus draining from your toenail, your health care provider may drain it. If you have trouble walking, you may be  given crutches to use. If you have a severe or infected ingrown toenail, you may need a procedure to remove part or all of the nail. Follow these instructions at home: Foot care  Check your wound every day for signs of infection, or as often as told by your health care provider. Check for: More redness, swelling, or pain. More fluid or blood. Warmth. Pus or a bad smell. Do not pick at your toenail or try to remove it yourself. Soak your foot in warm, soapy water. Do this for 20 minutes, 3 times a day, or as often as told by your health care provider. This helps to keep your toe clean and your skin soft. Wear shoes that fit well and are not too tight. Your health care provider may recommend that you wear open-toed shoes while you heal. Trim your toenails regularly and carefully. Cut your toenails straight across to prevent injury to the skin at the corners of the toenail. Do not cut your nails in a curved shape. Keep your feet clean and dry to help prevent infection. General instructions Take over-the-counter and prescription medicines only as told by your health care provider. If you were prescribed an antibiotic, take it as told by your health care provider. Do not stop taking the antibiotic even if you start to feel better. If your health care provider told you to use crutches to help you move around, use them as instructed. Return to your normal activities as told by your health care provider. Ask your health care provider what activities are safe for you.   Keep all follow-up visits. This is important. Contact a health care provider if: You have more redness, swelling, pain, or other symptoms that do not improve with treatment. You have fluid, blood, or pus coming from your toenail. You have a red streak on your skin that starts at your foot and spreads up your leg. You have a fever. Summary An ingrown toenail occurs when the corner or sides of a toenail grow into the surrounding skin.  This causes discomfort and pain. The big toe is most commonly affected, but any of the toes can be affected. If an ingrown toenail is not treated, it can become infected. Fluid or pus draining from your toenail is a sign of infection. Your health care provider may need to drain it. You may be given antibiotics to treat the infection. Trimming your toenails regularly and properly can help you prevent an ingrown toenail. This information is not intended to replace advice given to you by your health care provider. Make sure you discuss any questions you have with your health care provider. Document Revised: 02/26/2021 Document Reviewed: 02/26/2021 Elsevier Patient Education  2023 [employer] Inc[employer]  

## 2023-01-19 NOTE — Progress Notes (Signed)
Acute Office Visit  Subjective:     Patient ID: John Sharp, male    DOB: 09-Oct-1961, 62 y.o.   MRN: [id number]  Chief Complaint  Patient presents with   Nail Problem    HPI Patient is in today for left great toe red, swollen, painful for the past week or so. Pt has hx of ingrown toenails. Pt would like removed today. He has tried [facility] soaks and neosporin with no relief.   .. 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   GERD (gastroesophageal reflux disease) 05/13/2012   Erectile dysfunction 05/24/2012   OSA on CPAP 06/07/2012   Depression 07/29/2013   Medial epicondylitis of right elbow 03/10/2014   Primary osteoarthritis of both knees 04/28/2014   Essential tremor 09/19/2016   Swelling of arm 06/16/2017   Trochanteric bursitis, left hip 06/16/2017   Obstructive uropathy 06/16/2017   Seasonal allergies 06/10/2018   Pseudogout of right prepatellar bursa 09/06/2018   Status post ORIF of fracture of ankle 10/11/2018   Septic prepatellar bursitis of right knee 10/13/2018   Musculoskeletal neck pain 04/06/2019   Radiculitis of left cervical region 06/22/2019   Lumbar spondylosis 03/30/2020   Hyperlipidemia 06/25/2020   Hypertriglyceridemia 06/25/2020   Acute foreign body of right ear canal 08/14/2020   Frequent headaches 01/08/2021   Acute abdominal pain 01/08/2021   Right ear pain 07/02/2021   Acute non-recurrent maxillary sinusitis 07/02/2021   Chronic nasal congestion 09/03/2021   Dysfunction of both eustachian tubes 09/03/2021   Resolved Ambulatory Problems    Diagnosis Date Noted   Cough 01/06/2014   Bilateral ankle pain 05/10/2014   Elevated PSA 01/08/2015   Increased laboratory test result 01/08/2015   No energy 08/03/2015   Past Medical History:  Diagnosis Date   Prepatellar bursitis, right knee 09/06/2018     ROS See HPI.      Objective:    BP 135/83   Pulse 76   Ht 6' (1.829 m)   Wt 256  lb (116.1 kg)   SpO2 97%   BMI 34.72 kg/m  BP Readings from Last 3 Encounters:  01/19/23 135/83  07/15/22 101/72  12/31/21 100/83   Wt Readings from Last 3 Encounters:  01/19/23 256 lb (116.1 kg)  07/15/22 256 lb (116.1 kg)  07/02/21 248 lb (112.5 kg)    Toenail Avulsion Procedure Note  Pre-operative Diagnosis: Left Ingrown Great toenail   Post-operative Diagnosis: Left Ingrown Great toenail  Indications: pain Procedure Details  History of allergy to iodine: no  The risks (including bleeding and infection) and benefits of the  procedure and Verbal informed consent obtained.  After digital block anesthesia was obtained, a tourniquet was applied for hemostasis during the procedure.  After prepping with Betadine, the offending edge of the nail was freed from the nailbed and perionychium, and then split with scissors and removed with  forceps.  All visible granulation tissue is debrided. Antibiotic and bulky dressing was applied.    Complications: none.  Plan: 1. Soak the foot twice daily. Change dressing twice daily until healed over. 2. Warning signs of infection were reviewed.   3. Recommended that the patient use OTC acetaminophen as needed for pain.    Physical Exam Medial ingrown great left toenail with erythema and swelling around the nail cuticle and very tender to palpation      Assessment & Plan:  Marland KitchenMarland KitchenRevis was seen today for nail problem.  Diagnoses and all  orders for this visit:  Ingrown left big toenail -     doxycycline (VIBRA-TABS) 100 MG tablet; Take 1 tablet (100 mg total) by mouth 2 (two) times daily.  Paronychia of great toe of left foot -     doxycycline (VIBRA-TABS) 100 MG tablet; Take 1 tablet (100 mg total) by mouth 2 (two) times daily.   Toenail removed today Tylenol and ibuprofen as needed for pain Keep bandaged for 24 hours and then ok to use epson salt water soaks Ok to use topical bactroban as needed Doxycycline for infection  Iran Planas, PA-C

## 2023-02-14 ENCOUNTER — Other Ambulatory Visit: Payer: Self-pay | Admitting: Physician Assistant

## 2023-02-26 ENCOUNTER — Other Ambulatory Visit: Payer: Self-pay

## 2023-02-26 DIAGNOSIS — N139 Obstructive and reflux uropathy, unspecified: Secondary | ICD-10-CM

## 2023-03-02 ENCOUNTER — Ambulatory Visit (INDEPENDENT_AMBULATORY_CARE_PROVIDER_SITE_OTHER): Admitting: Physician Assistant

## 2023-03-02 ENCOUNTER — Encounter: Payer: Self-pay | Admitting: Physician Assistant

## 2023-03-02 VITALS — BP 128/83 | HR 99 | Ht 72.0 in | Wt 257.0 lb

## 2023-03-02 DIAGNOSIS — N139 Obstructive and reflux uropathy, unspecified: Secondary | ICD-10-CM

## 2023-03-02 DIAGNOSIS — J302 Other seasonal allergic rhinitis: Secondary | ICD-10-CM

## 2023-03-02 DIAGNOSIS — Z1211 Encounter for screening for malignant neoplasm of colon: Secondary | ICD-10-CM

## 2023-03-02 DIAGNOSIS — R350 Frequency of micturition: Secondary | ICD-10-CM

## 2023-03-02 DIAGNOSIS — E782 Mixed hyperlipidemia: Secondary | ICD-10-CM

## 2023-03-02 DIAGNOSIS — M11261 Other chondrocalcinosis, right knee: Secondary | ICD-10-CM

## 2023-03-02 DIAGNOSIS — N401 Enlarged prostate with lower urinary tract symptoms: Secondary | ICD-10-CM | POA: Insufficient documentation

## 2023-03-02 DIAGNOSIS — K2100 Gastro-esophageal reflux disease with esophagitis, without bleeding: Secondary | ICD-10-CM | POA: Diagnosis not present

## 2023-03-02 DIAGNOSIS — Z6834 Body mass index (BMI) 34.0-34.9, adult: Secondary | ICD-10-CM

## 2023-03-02 DIAGNOSIS — I952 Hypotension due to drugs: Secondary | ICD-10-CM | POA: Insufficient documentation

## 2023-03-02 DIAGNOSIS — E781 Pure hyperglyceridemia: Secondary | ICD-10-CM

## 2023-03-02 DIAGNOSIS — R600 Localized edema: Secondary | ICD-10-CM

## 2023-03-02 DIAGNOSIS — M549 Dorsalgia, unspecified: Secondary | ICD-10-CM | POA: Insufficient documentation

## 2023-03-02 DIAGNOSIS — Z79899 Other long term (current) drug therapy: Secondary | ICD-10-CM

## 2023-03-02 DIAGNOSIS — E6609 Other obesity due to excess calories: Secondary | ICD-10-CM

## 2023-03-02 MED ORDER — PANTOPRAZOLE SODIUM 40 MG PO TBEC: 40.0000 mg | DELAYED_RELEASE_TABLET | Freq: Every day | ORAL | 3 refills | Status: DC

## 2023-03-02 MED ORDER — HYDROCHLOROTHIAZIDE 12.5 MG PO TABS: 12.5000 mg | ORAL_TABLET | Freq: Every day | ORAL | 3 refills | Status: DC

## 2023-03-02 MED ORDER — COLCHICINE 0.6 MG PO TABS
0.6000 mg | ORAL_TABLET | Freq: Every day | ORAL | 3 refills | Status: DC
Start: 2023-03-02 — End: 2024-04-15

## 2023-03-02 MED ORDER — TAMSULOSIN HCL 0.4 MG PO CAPS: ORAL_CAPSULE | ORAL | 3 refills | Status: DC

## 2023-03-02 MED ORDER — MONTELUKAST SODIUM 10 MG PO TABS
10.0000 mg | ORAL_TABLET | Freq: Every day | ORAL | 3 refills | Status: DC
Start: 2023-03-02 — End: 2024-05-02

## 2023-03-02 MED ORDER — ATORVASTATIN CALCIUM 20 MG PO TABS: ORAL_TABLET | ORAL | 3 refills | Status: DC

## 2023-03-02 NOTE — Progress Notes (Unsigned)
Established Patient Office Visit  Subjective   Patient ID: John Sharp, male    DOB: 11-05-1961  Age: 62 y.o. MRN: [id number]  Chief Complaint  Patient presents with   Urinary Frequency    HPI Pt is a 62 yo male with GERD, OSA, Gout, BPH who presents to the clinic for follow up.   He has been out of some of his medications. He has noticed since being out of flomax a lot of his urinary symptoms returning. He did see urology a few years ago but not been seen since.   His reflux is controlled with protonix and pepcid if needed.   He is not using his CPAP. Not really sure why.   No gout flares recently.   No lower leg edema.   .. 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   GERD (gastroesophageal reflux disease) 05/13/2012   Erectile dysfunction 05/24/2012   OSA on CPAP 06/07/2012   Depression 07/29/2013   Medial epicondylitis of right elbow 03/10/2014   Primary osteoarthritis of both knees 04/28/2014   Essential tremor 09/19/2016   Swelling of arm 06/16/2017   Trochanteric bursitis, left hip 06/16/2017   Obstructive uropathy 06/16/2017   Seasonal allergies 06/10/2018   Pseudogout of right prepatellar bursa 09/06/2018   Status post ORIF of fracture of ankle 10/11/2018   Septic prepatellar bursitis of right knee 10/13/2018   Musculoskeletal neck pain 04/06/2019   Radiculitis of left cervical region 06/22/2019   Lumbar spondylosis 03/30/2020   Hyperlipidemia 06/25/2020   Hypertriglyceridemia 06/25/2020   Acute foreign body of right ear canal 08/14/2020   Frequent headaches 01/08/2021   Acute abdominal pain 01/08/2021   Right ear pain 07/02/2021   Acute non-recurrent maxillary sinusitis 07/02/2021   Chronic nasal congestion 09/03/2021   Dysfunction of both eustachian tubes 09/03/2021   Benign prostatic hyperplasia with urinary frequency 03/02/2023   Upper back pain 03/02/2023   Hypotension due to drugs 03/02/2023    Bilateral lower extremity edema 03/02/2023   Resolved Ambulatory Problems    Diagnosis Date Noted   Cough 01/06/2014   Bilateral ankle pain 05/10/2014   Elevated PSA 01/08/2015   Increased laboratory test result 01/08/2015   No energy 08/03/2015   Past Medical History:  Diagnosis Date   Prepatellar bursitis, right knee 09/06/2018     ROS See HPI.    Objective:     BP 128/83 (BP Location: Right Arm, Patient Position: Sitting, Cuff Size: Large)   Pulse 99   Ht 6' (1.829 m)   Wt 257 lb (116.6 kg)   SpO2 95%   BMI 34.86 kg/m  BP Readings from Last 3 Encounters:  03/02/23 128/83  01/19/23 135/83  07/15/22 101/72   Wt Readings from Last 3 Encounters:  03/02/23 257 lb (116.6 kg)  01/19/23 256 lb (116.1 kg)  07/15/22 256 lb (116.1 kg)    ..    01/19/2023    9:18 AM 07/15/2022    2:13 PM 06/10/2018   10:52 AM 07/29/2013    3:26 PM  Depression screen PHQ 2/9  Decreased Interest 0 0 0 1  Down, Depressed, Hopeless 0 0 0 0  PHQ - 2 Score 0 0 0 1  Altered sleeping    0  Tired, decreased energy    1  Change in appetite    0  Feeling bad or failure about yourself     0  Trouble concentrating    0  Moving slowly or fidgety/restless    0  Suicidal thoughts    0  PHQ-9 Score    2   AUA 22-moderate   Physical Exam Constitutional:      Appearance: Normal appearance. He is obese.  HENT:     Head: Normocephalic.  Cardiovascular:     Rate and Rhythm: Normal rate and regular rhythm.     Pulses: Normal pulses.     Heart sounds: Normal heart sounds.  Pulmonary:     Effort: Pulmonary effort is normal.     Breath sounds: Normal breath sounds.  Musculoskeletal:     Right lower leg: No edema.     Left lower leg: No edema.  Neurological:     General: No focal deficit present.     Mental Status: He is alert and oriented to person, place, and time.  Psychiatric:        Mood and Affect: Mood normal.         The 10-year ASCVD risk score (Arnett DK, et al., 2019) is:  16.7%    Assessment & Plan:  Marland KitchenMarland KitchenEligah was seen today for urinary frequency.  Diagnoses and all orders for this visit:  Benign prostatic hyperplasia with urinary frequency -     PSA -     COMPLETE METABOLIC PANEL WITH GFR  Obstructive uropathy -     tamsulosin (FLOMAX) 0.4 MG CAPS capsule; TAKE 1 CAPSULE DAILY.  Gastroesophageal reflux disease with esophagitis -     pantoprazole (PROTONIX) 40 MG tablet; Take 1 tablet (40 mg total) by mouth daily.  Mixed hyperlipidemia -     Lipid Panel w/reflex Direct LDL -     atorvastatin (LIPITOR) 20 MG tablet; TAKE 1 TABLET DAILY.  Pseudogout of right prepatellar bursa -     colchicine 0.6 MG tablet; Take 1 tablet (0.6 mg total) by mouth daily.  Seasonal allergies -     montelukast (SINGULAIR) 10 MG tablet; Take 1 tablet (10 mg total) by mouth at bedtime.  Medication management -     PSA -     TSH -     Lipid Panel w/reflex Direct LDL -     COMPLETE METABOLIC PANEL WITH GFR -     CBC with Differential/Platelet  Colon cancer screening -     Ambulatory referral to Gastroenterology  Class 1 obesity due to excess calories without serious comorbidity with body mass index (BMI) of 34.0 to 34.9 in adult  Gastroesophageal reflux disease with esophagitis without hemorrhage  Hypertriglyceridemia  Bilateral lower extremity edema -     hydrochlorothiazide (HYDRODIURIL) 12.5 MG tablet; Take 1 tablet (12.5 mg total) by mouth daily.   BP looks great AUA increased scores Start back on flomax PSA ordered with other screening labs Refilled HCTZ for lower extremity edema, warned can induce gout All medications sent for 90 day refill  Lipid ordered to follow up on CV risk.   Encouraged to start wearing CPAP  Need colonoscopy-sent to [facility] where his wife goes  Tandy Gaw, [place]

## 2023-03-03 ENCOUNTER — Encounter: Payer: Self-pay | Admitting: Physician Assistant

## 2023-03-04 NOTE — Progress Notes (Signed)
Naoki,   Thyroid looks good.  PsA is normal range.  Kidney and liver look good.  Fasting glucose is a little elevated. Add A1C to evaluate a little closer for DM.   HDL, good cholesterol, looks great.  LDL, bad cholesterol, not to goal but better than 2 years ago.  Continue on lipitor.   [name].The 10-year ASCVD risk score (Arnett DK, et al., 2019) is: 8.4%   Values used to calculate the score:     Age: 62 years     Sex: Male     Is Non-Hispanic African American: No     Diabetic: No     Tobacco smoker: No     Systolic Blood Pressure: 128 mmHg     Is BP treated: No     HDL Cholesterol: 53 mg/dL     Total Cholesterol: 185 mg/dL

## 2023-03-06 ENCOUNTER — Encounter: Payer: Self-pay | Admitting: Physician Assistant

## 2023-03-06 DIAGNOSIS — R7303 Prediabetes: Secondary | ICD-10-CM | POA: Insufficient documentation

## 2023-03-06 LAB — CBC WITH DIFFERENTIAL/PLATELET
Absolute Monocytes: 336 cells/uL (ref 200–950)
Basophils Absolute: 40 cells/uL (ref 0–200)
Basophils Relative: 1 %
Eosinophils Absolute: 68 cells/uL (ref 15–500)
Eosinophils Relative: 1.7 %
HCT: 44.2 % (ref 38.5–50.0)
Hemoglobin: 14.8 g/dL (ref 13.2–17.1)
Lymphs Abs: 1460 cells/uL (ref 850–3900)
MCH: 29.8 pg (ref 27.0–33.0)
MCHC: 33.5 g/dL (ref 32.0–36.0)
MCV: 88.9 fL (ref 80.0–100.0)
MPV: 11.5 fL (ref 7.5–12.5)
Monocytes Relative: 8.4 %
Neutro Abs: 2096 cells/uL (ref 1500–7800)
Neutrophils Relative %: 52.4 %
Platelets: 212 10*3/uL (ref 140–400)
RBC: 4.97 10*6/uL (ref 4.20–5.80)
RDW: 11.9 % (ref 11.0–15.0)
Total Lymphocyte: 36.5 %
WBC: 4 10*3/uL (ref 3.8–10.8)

## 2023-03-06 LAB — PSA: PSA: 2.2 ng/mL (ref <=4.00)

## 2023-03-06 LAB — COMPLETE METABOLIC PANEL WITH GFR
AG Ratio: 1.7 (calc) (ref 1.0–2.5)
ALT: 34 U/L (ref 9–46)
AST: 35 U/L (ref 10–35)
Albumin: 4.4 g/dL (ref 3.6–5.1)
Alkaline phosphatase (APISO): 74 U/L (ref 35–144)
BUN: 17 mg/dL (ref 7–25)
CO2: 27 mmol/L (ref 20–32)
Calcium: 9.4 mg/dL (ref 8.6–10.3)
Chloride: 104 mmol/L (ref 98–110)
Creat: 0.96 mg/dL (ref 0.70–1.35)
Globulin: 2.6 g/dL (calc) (ref 1.9–3.7)
Glucose, Bld: 102 mg/dL — ABNORMAL HIGH (ref 65–99)
Potassium: 4.5 mmol/L (ref 3.5–5.3)
Sodium: 140 mmol/L (ref 135–146)
Total Bilirubin: 0.5 mg/dL (ref 0.2–1.2)
Total Protein: 7 g/dL (ref 6.1–8.1)
eGFR: 90 mL/min/{1.73_m2} (ref >=60)

## 2023-03-06 LAB — LIPID PANEL W/REFLEX DIRECT LDL
Cholesterol: 185 mg/dL (ref <200)
HDL: 53 mg/dL (ref >=40)
LDL Cholesterol (Calc): 109 mg/dL (calc) — ABNORMAL HIGH
Non-HDL Cholesterol (Calc): 132 mg/dL (calc) — ABNORMAL HIGH (ref <130)
Total CHOL/HDL Ratio: 3.5 (calc) (ref <5.0)
Triglycerides: 119 mg/dL (ref <150)

## 2023-03-06 LAB — TSH: TSH: 1.86 mIU/L (ref 0.40–4.50)

## 2023-03-06 LAB — HEMOGLOBIN A1C W/OUT EAG: Hgb A1c MFr Bld: 5.9 % of total Hgb — ABNORMAL HIGH (ref <5.7)

## 2023-03-06 NOTE — Progress Notes (Signed)
A1C 5.9. Pre-diabetes range. You do need to start watching sugars and carbs in diet and try to get some exercise. Recheck in 6 months.

## 2023-03-09 ENCOUNTER — Telehealth: Payer: Self-pay | Admitting: Family Medicine

## 2023-03-09 DIAGNOSIS — W19XXXA Unspecified fall, initial encounter: Secondary | ICD-10-CM

## 2023-03-09 DIAGNOSIS — M79675 Pain in left toe(s): Secondary | ICD-10-CM

## 2023-03-09 DIAGNOSIS — S99922A Unspecified injury of left foot, initial encounter: Secondary | ICD-10-CM

## 2023-03-09 MED ORDER — TRAMADOL HCL 50 MG PO TABS
50.0000 mg | ORAL_TABLET | Freq: Three times a day (TID) | ORAL | 0 refills | Status: DC | PRN
Start: 2023-03-09 — End: 2024-01-11

## 2023-03-09 NOTE — Telephone Encounter (Signed)
Patient's wife was here today for her appointment and mentioned that her husband had injured his back working on the garage door over the weekend.  He occasionally takes tramadol as needed for pain but does not have anymore he is also on meloxicam.  Wanted to know if we could call in maybe 10 tabs for him to use.  Prescription sent.  If not better in 1 week then will need formal evaluation for his spine.  Meds ordered this encounter  Medications   traMADol (ULTRAM) 50 MG tablet    Sig: Take 1 tablet (50 mg total) by mouth every 8 (eight) hours as needed for moderate pain.    Dispense:  15 tablet    Refill:  0

## 2023-04-23 LAB — HM COLONOSCOPY

## 2023-06-11 ENCOUNTER — Encounter: Payer: Self-pay | Admitting: Physician Assistant

## 2023-06-29 ENCOUNTER — Ambulatory Visit (INDEPENDENT_AMBULATORY_CARE_PROVIDER_SITE_OTHER): Admitting: Physician Assistant

## 2023-06-29 ENCOUNTER — Encounter: Payer: Self-pay | Admitting: Physician Assistant

## 2023-06-29 VITALS — BP 145/80 | HR 86 | Temp 98.0°F | Ht 72.0 in | Wt 257.0 lb

## 2023-06-29 DIAGNOSIS — J029 Acute pharyngitis, unspecified: Secondary | ICD-10-CM | POA: Diagnosis not present

## 2023-06-29 LAB — POCT RAPID STREP A (OFFICE): Rapid Strep A Screen: NEGATIVE

## 2023-06-29 MED ORDER — METHYLPREDNISOLONE 4 MG PO TBPK
ORAL_TABLET | ORAL | 0 refills | Status: DC
Start: 2023-06-29 — End: 2024-01-11

## 2023-06-29 MED ORDER — AMOXICILLIN-POT CLAVULANATE 875-125 MG PO TABS
1.0000 | ORAL_TABLET | Freq: Two times a day (BID) | ORAL | 0 refills | Status: DC
Start: 2023-06-29 — End: 2024-01-11

## 2023-06-29 NOTE — Progress Notes (Signed)
Acute Office Visit  Subjective:     Patient ID: John Sharp, male    DOB: 1961-07-01, 62 y.o.   MRN: [id number]  Chief Complaint  Patient presents with   Sore    Sore throat    HPI Patient is in today for sore throat for the last week. His granddaughter has been sick with URI symptoms. He has tried ibuprofen and throat spray which has helped some. No fever, chills, body aches, headaches, sinus pressure. He has a dry cough. No SOB.  [name]. 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   GERD (gastroesophageal reflux disease) 05/13/2012   Erectile dysfunction 05/24/2012   OSA on CPAP 06/07/2012   Depression 07/29/2013   Medial epicondylitis of right elbow 03/10/2014   Primary osteoarthritis of both knees 04/28/2014   Essential tremor 09/19/2016   Swelling of arm 06/16/2017   Trochanteric bursitis, left hip 06/16/2017   Obstructive uropathy 06/16/2017   Seasonal allergies 06/10/2018   Pseudogout of right prepatellar bursa 09/06/2018   Status post ORIF of fracture of ankle 10/11/2018   Septic prepatellar bursitis of right knee 10/13/2018   Musculoskeletal neck pain 04/06/2019   Radiculitis of left cervical region 06/22/2019   Lumbar spondylosis 03/30/2020   Hyperlipidemia 06/25/2020   Hypertriglyceridemia 06/25/2020   Acute foreign body of right ear canal 08/14/2020   Frequent headaches 01/08/2021   Acute abdominal pain 01/08/2021   Right ear pain 07/02/2021   Acute non-recurrent maxillary sinusitis 07/02/2021   Chronic nasal congestion 09/03/2021   Dysfunction of both eustachian tubes 09/03/2021   Benign prostatic hyperplasia with urinary frequency 03/02/2023   Upper back pain 03/02/2023   Hypotension due to drugs 03/02/2023   Bilateral lower extremity edema 03/02/2023   Pre-diabetes 03/06/2023   Resolved Ambulatory Problems    Diagnosis Date Noted   Cough 01/06/2014   Bilateral ankle pain 05/10/2014   Elevated PSA  01/08/2015   Increased laboratory test result 01/08/2015   No energy 08/03/2015   Past Medical History:  Diagnosis Date   Prepatellar bursitis, right knee 09/06/2018     ROS See HPI.     Objective:    BP (!) 145/80   Pulse 86   Temp 98 F (36.7 C)   Ht 6' (1.829 m)   Wt 257 lb (116.6 kg)   PF (!) 999 L/min   BMI 34.86 kg/m  BP Readings from Last 3 Encounters:  06/29/23 (!) 145/80  03/02/23 128/83  01/19/23 135/83   Wt Readings from Last 3 Encounters:  06/29/23 257 lb (116.6 kg)  03/02/23 257 lb (116.6 kg)  01/19/23 256 lb (116.1 kg)    .[name] Results for orders placed or performed in visit on 06/29/23  POCT rapid strep A  Result Value Ref Range   Rapid Strep A Screen Negative Negative     Physical Exam Constitutional:      Appearance: Normal appearance. He is obese.  HENT:     Head: Normocephalic.     Right Ear: Tympanic membrane, ear canal and external ear normal. There is no impacted cerumen.     Left Ear: Tympanic membrane and ear canal normal. There is no impacted cerumen.     Nose: Nose normal.     Mouth/Throat:     Mouth: Mucous membranes are moist.     Pharynx: Posterior oropharyngeal erythema present. No oropharyngeal exudate.  Eyes:     Conjunctiva/sclera: Conjunctivae normal.  Neck:  Vascular: No carotid bruit.  Cardiovascular:     Rate and Rhythm: Normal rate and regular rhythm.     Pulses: Normal pulses.  Pulmonary:     Effort: Pulmonary effort is normal.  Musculoskeletal:     Cervical back: Normal range of motion and neck supple. Tenderness present. No rigidity.  Lymphadenopathy:     Cervical: Cervical adenopathy present.  Neurological:     General: No focal deficit present.     Mental Status: He is alert.  Psychiatric:        Mood and Affect: Mood normal.          Assessment & Plan:  Marland KitchenMarland KitchenAurelio was seen today for sore.  Diagnoses and all orders for this visit:  Acute pharyngitis, unspecified etiology -      amoxicillin-clavulanate (AUGMENTIN) 875-125 MG tablet; Take 1 tablet by mouth 2 (two) times daily. -     methylPREDNISolone (MEDROL DOSEPAK) 4 MG TBPK tablet; Take as directed by package insert.  Sore throat -     POCT rapid strep A   Strep negative but symptoms for over a week with no improvement with symptomatic care Sent augmentin and medrol dose pack Continue throat spray as needed Follow up if not improving or if symptoms worsening   Tandy Gaw, PA-C

## 2023-07-04 ENCOUNTER — Other Ambulatory Visit: Payer: Self-pay | Admitting: Physician Assistant

## 2023-09-01 ENCOUNTER — Ambulatory Visit

## 2023-09-02 ENCOUNTER — Ambulatory Visit (INDEPENDENT_AMBULATORY_CARE_PROVIDER_SITE_OTHER): Admitting: Physician Assistant

## 2023-09-02 DIAGNOSIS — Z23 Encounter for immunization: Secondary | ICD-10-CM

## 2023-09-02 NOTE — Progress Notes (Signed)
Pt here for covid vaccine

## 2023-12-09 ENCOUNTER — Other Ambulatory Visit: Payer: Self-pay | Admitting: Physician Assistant

## 2023-12-09 DIAGNOSIS — K2100 Gastro-esophageal reflux disease with esophagitis, without bleeding: Secondary | ICD-10-CM

## 2023-12-21 ENCOUNTER — Other Ambulatory Visit: Payer: Self-pay | Admitting: Physician Assistant

## 2023-12-21 DIAGNOSIS — M26622 Arthralgia of left temporomandibular joint: Secondary | ICD-10-CM

## 2024-01-11 ENCOUNTER — Encounter: Payer: Self-pay | Admitting: Physician Assistant

## 2024-01-11 ENCOUNTER — Ambulatory Visit (INDEPENDENT_AMBULATORY_CARE_PROVIDER_SITE_OTHER): Admitting: Physician Assistant

## 2024-01-11 VITALS — BP 125/80 | HR 84 | Ht 72.0 in | Wt 265.0 lb

## 2024-01-11 DIAGNOSIS — J069 Acute upper respiratory infection, unspecified: Secondary | ICD-10-CM

## 2024-01-11 DIAGNOSIS — R051 Acute cough: Secondary | ICD-10-CM

## 2024-01-11 DIAGNOSIS — R0981 Nasal congestion: Secondary | ICD-10-CM | POA: Diagnosis not present

## 2024-01-11 LAB — POCT INFLUENZA A/B
Influenza A, POC: NEGATIVE
Influenza B, POC: NEGATIVE

## 2024-01-11 LAB — POC COVID19 BINAXNOW: SARS Coronavirus 2 Ag: NEGATIVE

## 2024-01-11 MED ORDER — HYDROCODONE BIT-HOMATROP MBR 5-1.5 MG/5ML PO SOLN: 5.0000 mL | Freq: Three times a day (TID) | ORAL | 0 refills | Status: DC | PRN

## 2024-01-11 NOTE — Patient Instructions (Signed)

## 2024-01-11 NOTE — Progress Notes (Signed)
 Acute Office Visit  Subjective:     Patient ID: John Sharp, male    DOB: 1961-10-03, 63 y.o.   MRN: [id number]  CC: cough and congestion   HPI Patient presents today with a chief complaint of cough and congestion. Onset x1 day. He endorses his wife as a positive sick contact.  .. 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   GERD (gastroesophageal reflux disease) 05/13/2012   Erectile dysfunction 05/24/2012   OSA on CPAP 06/07/2012   Depression 07/29/2013   Medial epicondylitis of right elbow 03/10/2014   Primary osteoarthritis of both knees 04/28/2014   Essential tremor 09/19/2016   Swelling of arm 06/16/2017   Trochanteric bursitis, left hip 06/16/2017   Obstructive uropathy 06/16/2017   Seasonal allergies 06/10/2018   Pseudogout of right prepatellar bursa 09/06/2018   Status post ORIF of fracture of ankle 10/11/2018   Septic prepatellar bursitis of right knee 10/13/2018   Musculoskeletal neck pain 04/06/2019   Radiculitis of left cervical region 06/22/2019   Lumbar spondylosis 03/30/2020   Hyperlipidemia 06/25/2020   Hypertriglyceridemia 06/25/2020   Acute foreign body of right ear canal 08/14/2020   Frequent headaches 01/08/2021   Acute abdominal pain 01/08/2021   Right ear pain 07/02/2021   Acute non-recurrent maxillary sinusitis 07/02/2021   Chronic nasal congestion 09/03/2021   Dysfunction of both eustachian tubes 09/03/2021   Benign prostatic hyperplasia with urinary frequency 03/02/2023   Upper back pain 03/02/2023   Hypotension due to drugs 03/02/2023   Bilateral lower extremity edema 03/02/2023   Pre-diabetes 03/06/2023   Resolved Ambulatory Problems    Diagnosis Date Noted   Cough 01/06/2014   Bilateral ankle pain 05/10/2014   Elevated PSA 01/08/2015   Increased laboratory test result 01/08/2015   No energy 08/03/2015   Past Medical History:  Diagnosis Date   Prepatellar bursitis, right knee  09/06/2018    Review of Systems  Constitutional:  Negative for chills and fever.  HENT:  Positive for congestion.   Respiratory:  Positive for cough.   All other systems reviewed and are negative.     Objective:    BP 125/80 (BP Location: Left Arm, Patient Position: Sitting, Cuff Size: Large)   Pulse 84   Ht 6' (1.829 m)   Wt 120.2 kg   SpO2 97%   BMI 35.94 kg/m  {Vitals History (Optional):23777}  Physical Exam Constitutional:      Appearance: He is obese.  HENT:     Head: Normocephalic and atraumatic.     Right Ear: Tympanic membrane and ear canal normal.     Left Ear: Tympanic membrane and ear canal normal.     Mouth/Throat:     Mouth: Mucous membranes are moist.     Pharynx: Oropharynx is clear.  Eyes:     Extraocular Movements: Extraocular movements intact.  Cardiovascular:     Rate and Rhythm: Normal rate and regular rhythm.     Pulses: Normal pulses.     Heart sounds: Normal heart sounds.  Pulmonary:     Effort: Pulmonary effort is normal.     Breath sounds: Normal breath sounds.  Musculoskeletal:        General: Normal range of motion.     Cervical back: Normal range of motion.  Skin:    Capillary Refill: Capillary refill takes less than 2 seconds.  Neurological:     Mental Status: He is alert.  Psychiatric:  Mood and Affect: Mood normal.        Behavior: Behavior normal.       Assessment & Plan:  Marland KitchenMarland KitchenTremont was seen today for cough.  Diagnoses and all orders for this visit:  Acute cough -     POCT Influenza A/B -     POC COVID-19  Nasal congestion -     POCT Influenza A/B -     POC COVID-19    - POCT influenza A/B - POCT COVID-19  - tylenol cold and sinus OTC - Start promethazine cough syrup at night - F/u on Friday if not improving.       Ilean China, Student-PA

## 2024-01-12 ENCOUNTER — Encounter: Payer: Self-pay | Admitting: Physician Assistant

## 2024-01-18 ENCOUNTER — Other Ambulatory Visit: Payer: Self-pay | Admitting: Physician Assistant

## 2024-01-18 DIAGNOSIS — E782 Mixed hyperlipidemia: Secondary | ICD-10-CM

## 2024-02-02 ENCOUNTER — Other Ambulatory Visit: Payer: Self-pay | Admitting: Physician Assistant

## 2024-02-02 DIAGNOSIS — N139 Obstructive and reflux uropathy, unspecified: Secondary | ICD-10-CM

## 2024-02-08 ENCOUNTER — Other Ambulatory Visit: Payer: Self-pay | Admitting: Physician Assistant

## 2024-02-08 DIAGNOSIS — N401 Enlarged prostate with lower urinary tract symptoms: Secondary | ICD-10-CM

## 2024-02-08 DIAGNOSIS — R7303 Prediabetes: Secondary | ICD-10-CM

## 2024-02-08 DIAGNOSIS — Z1329 Encounter for screening for other suspected endocrine disorder: Secondary | ICD-10-CM

## 2024-02-08 DIAGNOSIS — E782 Mixed hyperlipidemia: Secondary | ICD-10-CM

## 2024-02-08 DIAGNOSIS — E781 Pure hyperglyceridemia: Secondary | ICD-10-CM

## 2024-02-08 NOTE — Progress Notes (Signed)
 Needs fasting labs

## 2024-02-16 ENCOUNTER — Other Ambulatory Visit: Payer: Self-pay | Admitting: Physician Assistant

## 2024-02-16 ENCOUNTER — Encounter: Payer: Self-pay | Admitting: Physician Assistant

## 2024-02-16 ENCOUNTER — Other Ambulatory Visit (HOSPITAL_COMMUNITY): Payer: Self-pay

## 2024-02-16 ENCOUNTER — Telehealth: Payer: Self-pay

## 2024-02-16 DIAGNOSIS — G4733 Obstructive sleep apnea (adult) (pediatric): Secondary | ICD-10-CM

## 2024-02-16 DIAGNOSIS — E781 Pure hyperglyceridemia: Secondary | ICD-10-CM

## 2024-02-16 DIAGNOSIS — E6609 Other obesity due to excess calories: Secondary | ICD-10-CM

## 2024-02-16 DIAGNOSIS — E66811 Obesity, class 1: Secondary | ICD-10-CM | POA: Insufficient documentation

## 2024-02-16 DIAGNOSIS — E782 Mixed hyperlipidemia: Secondary | ICD-10-CM

## 2024-02-16 DIAGNOSIS — R7303 Prediabetes: Secondary | ICD-10-CM

## 2024-02-16 LAB — CMP14+EGFR
ALT: 40 IU/L (ref 0–44)
AST: 49 IU/L — ABNORMAL HIGH (ref 0–40)
Albumin: 4.1 g/dL (ref 3.9–4.9)
Alkaline Phosphatase: 74 IU/L (ref 44–121)
BUN/Creatinine Ratio: 15 (ref 10–24)
BUN: 15 mg/dL (ref 8–27)
Bilirubin Total: 0.5 mg/dL (ref 0.0–1.2)
CO2: 23 mmol/L (ref 20–29)
Calcium: 9.2 mg/dL (ref 8.6–10.2)
Chloride: 105 mmol/L (ref 96–106)
Creatinine, Ser: 1.02 mg/dL (ref 0.76–1.27)
Globulin, Total: 2.2 g/dL (ref 1.5–4.5)
Glucose: 99 mg/dL (ref 70–99)
Potassium: 4.6 mmol/L (ref 3.5–5.2)
Sodium: 141 mmol/L (ref 134–144)
Total Protein: 6.3 g/dL (ref 6.0–8.5)
eGFR: 83 mL/min/{1.73_m2} (ref >59)

## 2024-02-16 LAB — CBC WITH DIFFERENTIAL/PLATELET
Basophils Absolute: 0 10*3/uL (ref 0.0–0.2)
Basos: 1 %
EOS (ABSOLUTE): 0.1 10*3/uL (ref 0.0–0.4)
Eos: 2 %
Hematocrit: 39.5 % (ref 37.5–51.0)
Hemoglobin: 12.6 g/dL — ABNORMAL LOW (ref 13.0–17.7)
Immature Grans (Abs): 0 10*3/uL (ref 0.0–0.1)
Immature Granulocytes: 0 %
Lymphocytes Absolute: 1.2 10*3/uL (ref 0.7–3.1)
Lymphs: 32 %
MCH: 27.8 pg (ref 26.6–33.0)
MCHC: 31.9 g/dL (ref 31.5–35.7)
MCV: 87 fL (ref 79–97)
Monocytes Absolute: 0.3 10*3/uL (ref 0.1–0.9)
Monocytes: 9 %
Neutrophils Absolute: 2.1 10*3/uL (ref 1.4–7.0)
Neutrophils: 56 %
Platelets: 194 10*3/uL (ref 150–450)
RBC: 4.53 x10E6/uL (ref 4.14–5.80)
RDW: 13.1 % (ref 11.6–15.4)
WBC: 3.7 10*3/uL (ref 3.4–10.8)

## 2024-02-16 LAB — LIPID PANEL
Chol/HDL Ratio: 3 ratio (ref 0.0–5.0)
Cholesterol, Total: 112 mg/dL (ref 100–199)
HDL: 37 mg/dL — ABNORMAL LOW (ref >39)
LDL Chol Calc (NIH): 56 mg/dL (ref 0–99)
Triglycerides: 99 mg/dL (ref 0–149)
VLDL Cholesterol Cal: 19 mg/dL (ref 5–40)

## 2024-02-16 LAB — HEMOGLOBIN A1C
Est. average glucose Bld gHb Est-mCnc: 126 mg/dL
Hgb A1c MFr Bld: 6 % — ABNORMAL HIGH (ref 4.8–5.6)

## 2024-02-16 LAB — PSA: Prostate Specific Ag, Serum: 3.3 ng/mL (ref 0.0–4.0)

## 2024-02-16 LAB — TSH: TSH: 1.53 u[IU]/mL (ref 0.450–4.500)

## 2024-02-16 MED ORDER — ZEPBOUND 2.5 MG/0.5ML ~~LOC~~ SOAJ: 2.5000 mg | SUBCUTANEOUS | 0 refills | Status: DC

## 2024-02-16 NOTE — Progress Notes (Addendum)
 Pt is obese with pre-diabetes, OSA on CPA, hypertriglyceridemia, hyperlipidemia.   Pt has tried multiple diets and intermittent exercise plans and failed for the last 5 years. He would like to try GLP-1 injections.   Start zepbound 2.5mg  weekly and will follow up in 3 weeks for further titration.   John Sharp.Diagnoses and all orders for this visit:  Pre-diabetes -     tirzepatide (ZEPBOUND) 2.5 MG/0.5ML Pen; Inject 2.5 mg into the skin once a week.  Mixed hyperlipidemia -     tirzepatide (ZEPBOUND) 2.5 MG/0.5ML Pen; Inject 2.5 mg into the skin once a week.  OSA on CPAP -     tirzepatide (ZEPBOUND) 2.5 MG/0.5ML Pen; Inject 2.5 mg into the skin once a week.  Hypertriglyceridemia -     tirzepatide (ZEPBOUND) 2.5 MG/0.5ML Pen; Inject 2.5 mg into the skin once a week.  Class 1 obesity due to excess calories without serious comorbidity with body mass index (BMI) of 34.0 to 34.9 in adult -     tirzepatide (ZEPBOUND) 2.5 MG/0.5ML Pen; Inject 2.5 mg into the skin once a week.

## 2024-02-16 NOTE — Progress Notes (Signed)
 John Sharp,   LDL looks great.  HDL could increase some! Exercise can help get this number up.   Thyroid looks great.   Hemoglobin a little low. I will ask the nurses to add serum iron and ferritin to labs.  Increase iron rich foods.   Pre-diabetes but fairly stable from 1 year ago.   Kidney function looks good.   One liver enzyme elevated. Will monitor and recheck in 3 months.

## 2024-02-16 NOTE — Telephone Encounter (Signed)
 Pharmacy Patient Advocate Encounter   Received notification from Patient Pharmacy that prior authorization for Zepbound 2.5 is required/requested.   Insurance verification completed.   The patient is insured through [employer] .   Per test claim: PA required; PA submitted to above mentioned insurance via CoverMyMeds Key/confirmation #/EOC [facility] Status is pending

## 2024-02-18 ENCOUNTER — Other Ambulatory Visit (HOSPITAL_COMMUNITY): Payer: Self-pay

## 2024-02-18 NOTE — Telephone Encounter (Signed)
 Pharmacy Patient Advocate Encounter  Received notification from [facility] that Prior Authorization for Zepbound has been DENIED.  Full denial letter will be uploaded to the media tab. See denial reason below.   PA #/Case ID/Reference #: [facility]

## 2024-02-19 ENCOUNTER — Other Ambulatory Visit: Payer: Self-pay | Admitting: Physician Assistant

## 2024-02-19 ENCOUNTER — Telehealth: Payer: Self-pay

## 2024-02-19 MED ORDER — TRULICITY 0.75 MG/0.5ML ~~LOC~~ SOAJ: 0.7500 mg | SUBCUTANEOUS | 1 refills | Status: DC

## 2024-02-19 NOTE — Telephone Encounter (Signed)
 Ozempic/Mounjaro/ Trulicity is approved exclusively as an adjunct to diet and exercise to improve glycemic control in adults with type 2 diabetes mellitus. A review of patient's medical chart reveals no documented diagnosis of type 2 diabetes or an A1C indicative of diabetes. Therefore, they do not currently meet the criteria for prior authorization of this medication. If clinically appropriate, alternative options such as Delford Field, or Reginal Lutes may be considered for this patient.  Prior authorization is submitted, key [id number]. Status is pending.

## 2024-02-22 ENCOUNTER — Other Ambulatory Visit: Payer: Self-pay | Admitting: Physician Assistant

## 2024-02-22 MED ORDER — TRULICITY 0.75 MG/0.5ML ~~LOC~~ SOAJ: 0.7500 mg | SUBCUTANEOUS | 1 refills | Status: DC

## 2024-02-22 NOTE — Telephone Encounter (Signed)
 Pharmacy Patient Advocate Encounter  Received notification from [facility] that Prior Authorization for Trulicity has been DENIED.  Full denial letter will be uploaded to the media tab. See denial reason below.   PA #/Case ID/Reference #: [id number]

## 2024-02-22 NOTE — Progress Notes (Signed)
 [facility] day supply.

## 2024-03-18 ENCOUNTER — Other Ambulatory Visit: Payer: Self-pay | Admitting: Physician Assistant

## 2024-03-18 DIAGNOSIS — R600 Localized edema: Secondary | ICD-10-CM

## 2024-03-18 MED ORDER — HYDROCHLOROTHIAZIDE 12.5 MG PO TABS: 12.5000 mg | ORAL_TABLET | Freq: Every day | ORAL | 0 refills | Status: DC

## 2024-03-24 NOTE — Telephone Encounter (Signed)
 Patient requesting rx rf of furosemide Did not see furosemide in current medication list or past medication list.  Called and spoke with patient.  He states he was given this many years ago for swelling related to bilateral ankle fractures and is having bilateral ankle swelling again.  I scheduled patient for visit with Sandy Crumb, PA for 03/29/24 for evaluation.

## 2024-03-25 MED ORDER — FUROSEMIDE 20 MG PO TABS
ORAL_TABLET | ORAL | 1 refills | Status: DC
Start: 2024-03-25 — End: 2024-05-23

## 2024-03-25 NOTE — Addendum Note (Signed)
 Addended by: Araceli Knight on: 03/25/2024 08:49 AM   Modules accepted: Orders

## 2024-03-29 ENCOUNTER — Encounter: Payer: Self-pay | Admitting: Physician Assistant

## 2024-03-29 ENCOUNTER — Ambulatory Visit (INDEPENDENT_AMBULATORY_CARE_PROVIDER_SITE_OTHER): Admitting: Physician Assistant

## 2024-03-29 VITALS — BP 136/81 | HR 81 | Ht 72.0 in | Wt 260.0 lb

## 2024-03-29 DIAGNOSIS — R600 Localized edema: Secondary | ICD-10-CM

## 2024-03-29 DIAGNOSIS — I878 Other specified disorders of veins: Secondary | ICD-10-CM

## 2024-04-01 ENCOUNTER — Encounter: Payer: Self-pay | Admitting: Physician Assistant

## 2024-04-01 DIAGNOSIS — I878 Other specified disorders of veins: Secondary | ICD-10-CM | POA: Insufficient documentation

## 2024-04-01 NOTE — Progress Notes (Signed)
   Established Patient Office Visit  Subjective   Patient ID: John Sharp, male    DOB: 04/14/1961  Age: 63 y.o. MRN: [id number]  Chief Complaint  Patient presents with   Medical Management of Chronic Issues    Med consult    HPI Pt is a 63 yo male who presents to the clinic to follow up on lower extermity edema. He called in because swelling seemed to be getting worse and wanted lasix  as needed. He is taking hydrochlorothiazide  12.5mg  daily. Most days that is enough but some days still has swelling. Swelling is around ankles. He denies any CP, SOB. Swelling improves at night. He admits he is more sedentary with his job.     ROS See HPI.    Objective:     BP 136/81   Pulse 81   Ht 6' (1.829 m)   Wt 260 lb (117.9 kg)   SpO2 99%   BMI 35.26 kg/m  BP Readings from Last 3 Encounters:  03/29/24 136/81  01/11/24 125/80  06/29/23 (!) 145/80   Wt Readings from Last 3 Encounters:  03/29/24 260 lb (117.9 kg)  01/11/24 265 lb (120.2 kg)  06/29/23 257 lb (116.6 kg)      Physical Exam Constitutional:      Appearance: Normal appearance.  Cardiovascular:     Rate and Rhythm: Normal rate and regular rhythm.  Pulmonary:     Effort: Pulmonary effort is normal.     Breath sounds: Normal breath sounds.  Musculoskeletal:     Right lower leg: Edema present.     Left lower leg: Edema present.     Comments: Very scant edema around ankles.  Pedal pulses 2+bilaterally.   Neurological:     General: No focal deficit present.     Mental Status: He is alert and oriented to person, place, and time.  Psychiatric:        Mood and Affect: Mood normal.        Assessment & Plan:  Aaron AasAaron AasJacquese was seen today for medical management of chronic issues.  Diagnoses and all orders for this visit:  Bilateral lower extremity edema  Chronic venous stasis   Vitals look good today Discussed venous stasis and importance of compression stockings Continue hydrochlorothiazide  daily and can  increase to 2 tablets if needed before using lasix  if not repsonding to hydrochlorothiazide  Keep low salt diet Try to walk some every hour and when sitting keep feet raised Weight has increased working on weight loss can help swelling in lower extermities Follow up as needed    Nikeia Henkes, PA-C

## 2024-04-15 ENCOUNTER — Other Ambulatory Visit: Payer: Self-pay | Admitting: Physician Assistant

## 2024-04-15 DIAGNOSIS — M11261 Other chondrocalcinosis, right knee: Secondary | ICD-10-CM

## 2024-04-25 NOTE — Telephone Encounter (Signed)
 Form printed and placed in your box for completion

## 2024-04-25 NOTE — Telephone Encounter (Signed)
 Can we get DMV form and place in box for me to sign.

## 2024-04-28 ENCOUNTER — Other Ambulatory Visit: Payer: Self-pay | Admitting: Physician Assistant

## 2024-04-28 DIAGNOSIS — J302 Other seasonal allergic rhinitis: Secondary | ICD-10-CM

## 2024-05-20 ENCOUNTER — Other Ambulatory Visit: Payer: Self-pay | Admitting: Physician Assistant

## 2024-05-20 DIAGNOSIS — R600 Localized edema: Secondary | ICD-10-CM

## 2024-05-20 NOTE — Telephone Encounter (Signed)
 Pharmacy requesting Lasix  refill. Is this appropriate? OV note: Continue hydrochlorothiazide  daily and can increase to 2 tablets if needed before using lasix  if not repsonding to hydrochlorothiazide   Please advise.

## 2024-05-20 NOTE — Telephone Encounter (Signed)
 Yes he should only be using lasix  if he has increased to 2 tablets of hydrochlorothiazide  and still swelling. This should be very as needed. If this is not I need to be aware. He should not be getting lasix  filled monthly.

## 2024-05-24 ENCOUNTER — Other Ambulatory Visit: Payer: Self-pay | Admitting: Physician Assistant

## 2024-05-24 DIAGNOSIS — R600 Localized edema: Secondary | ICD-10-CM

## 2024-06-16 ENCOUNTER — Other Ambulatory Visit: Payer: Self-pay | Admitting: Physician Assistant

## 2024-07-12 ENCOUNTER — Encounter: Payer: Self-pay | Admitting: Sports Medicine

## 2024-07-30 ENCOUNTER — Other Ambulatory Visit: Payer: Self-pay | Admitting: Physician Assistant

## 2024-07-30 DIAGNOSIS — R600 Localized edema: Secondary | ICD-10-CM

## 2024-09-13 LAB — LAB REPORT - SCANNED
A1c: 6.1
EGFR: 80

## 2024-09-17 ENCOUNTER — Other Ambulatory Visit: Payer: Self-pay | Admitting: Physician Assistant

## 2024-09-17 DIAGNOSIS — K2100 Gastro-esophageal reflux disease with esophagitis, without bleeding: Secondary | ICD-10-CM

## 2024-09-23 ENCOUNTER — Ambulatory Visit (INDEPENDENT_AMBULATORY_CARE_PROVIDER_SITE_OTHER): Admitting: Physician Assistant

## 2024-09-23 VITALS — BP 135/70 | HR 81 | Ht 72.0 in | Wt 260.0 lb

## 2024-09-23 DIAGNOSIS — R21 Rash and other nonspecific skin eruption: Secondary | ICD-10-CM

## 2024-09-23 MED ORDER — KETOCONAZOLE 2 % EX CREA: 1.0000 | TOPICAL_CREAM | Freq: Two times a day (BID) | CUTANEOUS | 1 refills | Status: AC

## 2024-09-23 MED ORDER — SUCRALFATE 1 G PO TABS: 1.0000 g | ORAL_TABLET | Freq: Four times a day (QID) | ORAL | Status: DC

## 2024-09-23 NOTE — Patient Instructions (Signed)
 Start ketoconazole twice a day. Referral to dermatology.

## 2024-09-23 NOTE — Progress Notes (Signed)
   Acute Office Visit  Subjective:     Patient ID: John Sharp, male    DOB: 03-Apr-1961, 64 y.o.   MRN: [id number]  Chief Complaint  Patient presents with   Medical Management of Chronic Issues    HPI .SABRADiscussed the use of AI scribe software for clinical note transcription with the patient, who gave verbal consent to proceed.  History of Present Illness John Sharp is a 63 year old male who presents with unusual discoloration on his left foot.  Cutaneous discoloration of foot - Gold and bronze colored spot on one foot, approximately the size of an eraser - Lesion is not raised and non-tender and not itchy - Discoloration is localized to one foot and has not spread - Onset after wearing boots during a trip to the [place] last Saturday - Attempted removal with alcohol and peroxide without success - No recent contact with dyes or other substances that could cause staining - Typically wears flip flops and rarely wears socks  Prediabetes management - Currently taking metformin for prediabetes - Most recent A1c is 6.1 - Weight loss of 10 to 12 pounds while on metformin      ROS See HPI.      Objective:    BP 135/70   Pulse 81   Ht 6' (1.829 m)   Wt 260 lb (117.9 kg)   SpO2 99%   BMI 35.26 kg/m  BP Readings from Last 3 Encounters:  09/23/24 135/70  03/29/24 136/81  01/11/24 125/80   Wt Readings from Last 3 Encounters:  09/23/24 260 lb (117.9 kg)  03/29/24 260 lb (117.9 kg)  01/11/24 265 lb (120.2 kg)      Physical Exam   2 rust brown spots on the medial left foot the size of eraser. Non tender, no scales and not blanchable.     Assessment & Plan:  SABRASABRAKentravious was seen today for medical management of chronic issues.  Diagnoses and all orders for this visit:  Rash and nonspecific skin eruption -     ketoconazole (NIZORAL) 2 % cream; Apply 1 Application topically 2 (two) times daily. To affected areas. -     Ambulatory referral to  Dermatology  Other orders -     Discontinue: sucralfate  (CARAFATE ) 1 g tablet; Take 1 tablet (1 g total) by mouth 4 (four) times daily.   Assessment & Plan Rash and skin discoloration, left foot Rash on right foot, possible fungal infection or hemosiderosis. No pain or raised lesions. Initial antifungal treatment due to presentation. - Prescribed antifungal cream twice daily for 2-4 weeks. - Referred to dermatology for further evaluation.  Prediabetes A1c 6.1, managed with metformin. Discussed alternative medication options, not indicated at current A1c level. Noted weight loss on metformin.     John Lansdale, PA-C

## 2024-09-26 ENCOUNTER — Encounter: Payer: Self-pay | Admitting: Physician Assistant
